# Patient Record
Sex: Female | Born: 1980 | Race: Black or African American | Hispanic: No | Smoking: Current every day smoker
Health system: Southern US, Community
[De-identification: ages and names within clinical notes are randomized; demographics above are authoritative.]

## PROBLEM LIST (undated history)

## (undated) DIAGNOSIS — Z87442 Personal history of urinary calculi: Secondary | ICD-10-CM

## (undated) DIAGNOSIS — F259 Schizoaffective disorder, unspecified: Secondary | ICD-10-CM

## (undated) DIAGNOSIS — F32A Depression, unspecified: Secondary | ICD-10-CM

## (undated) DIAGNOSIS — F329 Major depressive disorder, single episode, unspecified: Secondary | ICD-10-CM

## (undated) DIAGNOSIS — F250 Schizoaffective disorder, bipolar type: Secondary | ICD-10-CM

## (undated) DIAGNOSIS — F319 Bipolar disorder, unspecified: Secondary | ICD-10-CM

## (undated) DIAGNOSIS — G43909 Migraine, unspecified, not intractable, without status migrainosus: Secondary | ICD-10-CM

## (undated) DIAGNOSIS — J45909 Unspecified asthma, uncomplicated: Secondary | ICD-10-CM

## (undated) DIAGNOSIS — K219 Gastro-esophageal reflux disease without esophagitis: Secondary | ICD-10-CM

## (undated) HISTORY — DX: Bipolar disorder, unspecified: F31.9

## (undated) HISTORY — PX: OTHER SURGICAL HISTORY: SHX169

## (undated) HISTORY — DX: Schizoaffective disorder, bipolar type: F25.0

## (undated) HISTORY — DX: Depression, unspecified: F32.A

## (undated) HISTORY — DX: Schizoaffective disorder, unspecified: F25.9

## (undated) HISTORY — DX: Major depressive disorder, single episode, unspecified: F32.9

## (undated) HISTORY — DX: Migraine, unspecified, not intractable, without status migrainosus: G43.909

---

## 2004-07-06 ENCOUNTER — Emergency Department: Payer: Self-pay | Admitting: General Practice

## 2004-12-23 ENCOUNTER — Emergency Department: Payer: Self-pay | Admitting: Emergency Medicine

## 2005-06-25 ENCOUNTER — Emergency Department: Payer: Self-pay | Admitting: Emergency Medicine

## 2005-07-07 ENCOUNTER — Emergency Department: Payer: Self-pay | Admitting: Emergency Medicine

## 2005-08-11 ENCOUNTER — Emergency Department: Payer: Self-pay | Admitting: Emergency Medicine

## 2006-01-13 ENCOUNTER — Ambulatory Visit: Payer: Self-pay | Admitting: Emergency Medicine

## 2006-01-13 ENCOUNTER — Emergency Department: Payer: Self-pay | Admitting: Emergency Medicine

## 2006-02-05 ENCOUNTER — Emergency Department: Payer: Self-pay | Admitting: Emergency Medicine

## 2006-03-17 ENCOUNTER — Emergency Department: Payer: Self-pay | Admitting: General Practice

## 2006-07-21 ENCOUNTER — Emergency Department: Payer: Self-pay | Admitting: Emergency Medicine

## 2006-08-17 ENCOUNTER — Emergency Department: Payer: Self-pay | Admitting: Emergency Medicine

## 2007-03-02 ENCOUNTER — Emergency Department: Payer: Self-pay | Admitting: Emergency Medicine

## 2007-10-16 ENCOUNTER — Emergency Department: Payer: Self-pay | Admitting: Emergency Medicine

## 2007-10-18 ENCOUNTER — Emergency Department: Payer: Self-pay | Admitting: Internal Medicine

## 2007-10-20 ENCOUNTER — Emergency Department: Payer: Self-pay | Admitting: Emergency Medicine

## 2008-01-19 ENCOUNTER — Emergency Department: Payer: Self-pay | Admitting: Emergency Medicine

## 2008-08-20 ENCOUNTER — Emergency Department: Payer: Self-pay | Admitting: Emergency Medicine

## 2008-12-08 ENCOUNTER — Emergency Department: Payer: Self-pay | Admitting: Unknown Physician Specialty

## 2008-12-14 ENCOUNTER — Emergency Department: Payer: Self-pay | Admitting: Unknown Physician Specialty

## 2009-01-30 ENCOUNTER — Emergency Department: Payer: Self-pay | Admitting: Emergency Medicine

## 2010-04-15 ENCOUNTER — Emergency Department: Payer: Self-pay | Admitting: Unknown Physician Specialty

## 2010-04-28 ENCOUNTER — Emergency Department: Payer: Self-pay | Admitting: Emergency Medicine

## 2010-12-06 ENCOUNTER — Emergency Department: Payer: Self-pay | Admitting: Emergency Medicine

## 2011-02-24 ENCOUNTER — Emergency Department: Payer: Self-pay | Admitting: Emergency Medicine

## 2011-06-01 ENCOUNTER — Emergency Department: Payer: Self-pay | Admitting: Emergency Medicine

## 2011-06-30 ENCOUNTER — Emergency Department: Payer: Self-pay | Admitting: Emergency Medicine

## 2011-09-29 ENCOUNTER — Emergency Department: Payer: Self-pay | Admitting: Emergency Medicine

## 2011-10-22 ENCOUNTER — Emergency Department: Payer: Self-pay | Admitting: Emergency Medicine

## 2011-10-22 LAB — COMPREHENSIVE METABOLIC PANEL
Anion Gap: 14 (ref 7–16)
BUN: 8 mg/dL (ref 7–18)
Bilirubin,Total: 0.2 mg/dL (ref 0.2–1.0)
Chloride: 107 mmol/L (ref 98–107)
Creatinine: 0.71 mg/dL (ref 0.60–1.30)
EGFR (African American): >60
EGFR (Non-African Amer.): >60
Osmolality: 283 (ref 275–301)
Potassium: 3.5 mmol/L (ref 3.5–5.1)
SGOT(AST): 14 U/L — ABNORMAL LOW (ref 15–37)
Sodium: 142 mmol/L (ref 136–145)
Total Protein: 7.7 g/dL (ref 6.4–8.2)

## 2011-10-22 LAB — CBC
HCT: 34.5 % — ABNORMAL LOW (ref 35.0–47.0)
MCH: 24.8 pg — ABNORMAL LOW (ref 26.0–34.0)
Platelet: 358 10*3/uL (ref 150–440)
RBC: 4.48 10*6/uL (ref 3.80–5.20)
WBC: 6.5 10*3/uL (ref 3.6–11.0)

## 2011-10-22 LAB — TROPONIN I: Troponin-I: <0.02 ng/mL

## 2011-11-04 ENCOUNTER — Emergency Department: Payer: Self-pay | Admitting: Emergency Medicine

## 2012-01-19 ENCOUNTER — Emergency Department: Payer: Self-pay | Admitting: Emergency Medicine

## 2012-01-19 LAB — COMPREHENSIVE METABOLIC PANEL
Albumin: 3.7 g/dL (ref 3.4–5.0)
Calcium, Total: 8.7 mg/dL (ref 8.5–10.1)
Chloride: 107 mmol/L (ref 98–107)
EGFR (African American): >60
EGFR (Non-African Amer.): >60
Glucose: 88 mg/dL (ref 65–99)
Osmolality: 280 (ref 275–301)
SGOT(AST): 17 U/L (ref 15–37)
Sodium: 142 mmol/L (ref 136–145)
Total Protein: 7.6 g/dL (ref 6.4–8.2)

## 2012-01-19 LAB — URINALYSIS, COMPLETE
Glucose,UR: NEGATIVE mg/dL (ref 0–75)
Leukocyte Esterase: NEGATIVE
Nitrite: POSITIVE
Specific Gravity: 1.019 (ref 1.003–1.030)
Squamous Epithelial: 9
WBC UR: NONE SEEN /HPF (ref 0–5)

## 2012-01-19 LAB — DRUG SCREEN, URINE
Amphetamines, Ur Screen: NEGATIVE (ref ?–1000)
Cannabinoid 50 Ng, Ur ~~LOC~~: POSITIVE (ref ?–50)
Cocaine Metabolite,Ur ~~LOC~~: NEGATIVE (ref ?–300)
MDMA (Ecstasy)Ur Screen: NEGATIVE (ref ?–500)
Opiate, Ur Screen: NEGATIVE (ref ?–300)

## 2012-01-19 LAB — CBC
HCT: 35.1 % (ref 35.0–47.0)
HGB: 11.1 g/dL — ABNORMAL LOW (ref 12.0–16.0)
MCH: 24.3 pg — ABNORMAL LOW (ref 26.0–34.0)
Platelet: 320 10*3/uL (ref 150–440)
RBC: 4.55 10*6/uL (ref 3.80–5.20)
RDW: 18.4 % — ABNORMAL HIGH (ref 11.5–14.5)
WBC: 5.8 10*3/uL (ref 3.6–11.0)

## 2012-01-19 LAB — SALICYLATE LEVEL: Salicylates, Serum: 3.6 mg/dL — ABNORMAL HIGH

## 2012-01-19 LAB — ETHANOL: Ethanol %: <0.003 % (ref 0.000–0.080)

## 2012-05-20 ENCOUNTER — Emergency Department: Payer: Self-pay | Admitting: Emergency Medicine

## 2012-05-20 LAB — URINALYSIS, COMPLETE
Bilirubin,UR: NEGATIVE
Glucose,UR: NEGATIVE mg/dL (ref 0–75)
Ketone: NEGATIVE
Ph: 7 (ref 4.5–8.0)
Protein: 30
RBC,UR: 2613 /HPF (ref 0–5)
Squamous Epithelial: 1

## 2012-05-20 LAB — CBC
HCT: 35.6 % (ref 35.0–47.0)
HGB: 11.8 g/dL — ABNORMAL LOW (ref 12.0–16.0)
MCH: 26.9 pg (ref 26.0–34.0)
MCV: 81 fL (ref 80–100)
RBC: 4.39 10*6/uL (ref 3.80–5.20)
RDW: 18.8 % — ABNORMAL HIGH (ref 11.5–14.5)

## 2012-05-20 LAB — COMPREHENSIVE METABOLIC PANEL
Alkaline Phosphatase: 71 U/L (ref 50–136)
Anion Gap: 7 (ref 7–16)
BUN: 9 mg/dL (ref 7–18)
Bilirubin,Total: 0.2 mg/dL (ref 0.2–1.0)
Calcium, Total: 9 mg/dL (ref 8.5–10.1)
Chloride: 106 mmol/L (ref 98–107)
Co2: 27 mmol/L (ref 21–32)
Creatinine: 0.72 mg/dL (ref 0.60–1.30)
EGFR (African American): >60
EGFR (Non-African Amer.): >60
Glucose: 96 mg/dL (ref 65–99)
SGOT(AST): 17 U/L (ref 15–37)
SGPT (ALT): 18 U/L (ref 12–78)
Sodium: 140 mmol/L (ref 136–145)
Total Protein: 7.8 g/dL (ref 6.4–8.2)

## 2012-05-20 LAB — LIPASE, BLOOD: Lipase: 105 U/L (ref 73–393)

## 2012-05-23 LAB — URINE CULTURE

## 2012-06-08 ENCOUNTER — Emergency Department: Payer: Self-pay | Admitting: Emergency Medicine

## 2012-11-05 LAB — BASIC METABOLIC PANEL
Anion Gap: 9 (ref 7–16)
BUN: 9 mg/dL (ref 7–18)
Chloride: 107 mmol/L (ref 98–107)
Co2: 22 mmol/L (ref 21–32)
EGFR (African American): >60
EGFR (Non-African Amer.): >60
Potassium: 3.6 mmol/L (ref 3.5–5.1)

## 2012-11-05 LAB — CBC
HCT: 36.4 % (ref 35.0–47.0)
HGB: 11.6 g/dL — ABNORMAL LOW (ref 12.0–16.0)
MCH: 25.5 pg — ABNORMAL LOW (ref 26.0–34.0)
MCHC: 31.8 g/dL — ABNORMAL LOW (ref 32.0–36.0)

## 2012-11-06 ENCOUNTER — Inpatient Hospital Stay: Payer: Self-pay | Admitting: Internal Medicine

## 2012-11-09 LAB — CREATININE, SERUM
Creatinine: 0.63 mg/dL (ref 0.60–1.30)
EGFR (African American): >60
EGFR (Non-African Amer.): >60

## 2012-11-09 LAB — PLATELET COUNT: Platelet: 386 10*3/uL (ref 150–440)

## 2013-03-03 ENCOUNTER — Emergency Department: Payer: Self-pay | Admitting: Emergency Medicine

## 2013-03-05 ENCOUNTER — Emergency Department: Payer: Self-pay | Admitting: Emergency Medicine

## 2013-05-31 LAB — CBC
HCT: 35.9 % (ref 35.0–47.0)
HGB: 11.8 g/dL — ABNORMAL LOW (ref 12.0–16.0)
MCH: 25.4 pg — ABNORMAL LOW (ref 26.0–34.0)
MCHC: 32.8 g/dL (ref 32.0–36.0)
Platelet: 341 10*3/uL (ref 150–440)
RBC: 4.64 10*6/uL (ref 3.80–5.20)
RDW: 18 % — ABNORMAL HIGH (ref 11.5–14.5)
WBC: 13.1 10*3/uL — ABNORMAL HIGH (ref 3.6–11.0)

## 2013-05-31 LAB — COMPREHENSIVE METABOLIC PANEL
Anion Gap: 8 (ref 7–16)
BUN: 7 mg/dL (ref 7–18)
Bilirubin,Total: 0.3 mg/dL (ref 0.2–1.0)
Co2: 22 mmol/L (ref 21–32)
EGFR (African American): >60
EGFR (Non-African Amer.): >60
Glucose: 113 mg/dL — ABNORMAL HIGH (ref 65–99)
Osmolality: 273 (ref 275–301)
Sodium: 137 mmol/L (ref 136–145)
Total Protein: 7.8 g/dL (ref 6.4–8.2)

## 2013-05-31 LAB — URINALYSIS, COMPLETE
Bilirubin,UR: NEGATIVE
Ph: 5 (ref 4.5–8.0)
Protein: 30
RBC,UR: 823 /HPF (ref 0–5)
Squamous Epithelial: 3

## 2013-06-01 LAB — CBC WITH DIFFERENTIAL/PLATELET
Basophil %: 0.1 %
Eosinophil #: 0 10*3/uL (ref 0.0–0.7)
Eosinophil %: 0 %
HGB: 11.8 g/dL — ABNORMAL LOW (ref 12.0–16.0)
Lymphocyte #: 0.9 10*3/uL — ABNORMAL LOW (ref 1.0–3.6)
MCHC: 31.8 g/dL — ABNORMAL LOW (ref 32.0–36.0)
Monocyte #: 0.1 x10 3/mm — ABNORMAL LOW (ref 0.2–0.9)
Neutrophil #: 14.6 10*3/uL — ABNORMAL HIGH (ref 1.4–6.5)
Platelet: 356 10*3/uL (ref 150–440)
RBC: 4.77 10*6/uL (ref 3.80–5.20)
RDW: 18.3 % — ABNORMAL HIGH (ref 11.5–14.5)

## 2013-06-02 ENCOUNTER — Inpatient Hospital Stay: Payer: Self-pay | Admitting: Internal Medicine

## 2013-06-02 LAB — CBC WITH DIFFERENTIAL/PLATELET
Basophil #: 0.1 10*3/uL (ref 0.0–0.1)
HCT: 35.9 % (ref 35.0–47.0)
Lymphocyte %: 5.9 %
MCV: 78 fL — ABNORMAL LOW (ref 80–100)
Monocyte %: 2.7 %
Neutrophil %: 91 %
Platelet: 371 10*3/uL (ref 150–440)
WBC: 20.9 10*3/uL — ABNORMAL HIGH (ref 3.6–11.0)

## 2013-06-02 LAB — DRUG SCREEN, URINE
Barbiturates, Ur Screen: NEGATIVE (ref ?–200)
Benzodiazepine, Ur Scrn: NEGATIVE (ref ?–200)
Cannabinoid 50 Ng, Ur ~~LOC~~: POSITIVE (ref ?–50)
Cocaine Metabolite,Ur ~~LOC~~: NEGATIVE (ref ?–300)
MDMA (Ecstasy)Ur Screen: POSITIVE (ref ?–500)
Methadone, Ur Screen: NEGATIVE (ref ?–300)
Tricyclic, Ur Screen: NEGATIVE (ref ?–1000)

## 2013-06-03 LAB — CBC WITH DIFFERENTIAL/PLATELET
Basophil #: 0.1 10*3/uL (ref 0.0–0.1)
HGB: 11.5 g/dL — ABNORMAL LOW (ref 12.0–16.0)
Lymphocyte #: 1.3 10*3/uL (ref 1.0–3.6)
MCH: 25 pg — ABNORMAL LOW (ref 26.0–34.0)
MCHC: 32.1 g/dL (ref 32.0–36.0)
MCV: 78 fL — ABNORMAL LOW (ref 80–100)
Monocyte #: 0.5 x10 3/mm (ref 0.2–0.9)
Monocyte %: 3.1 %
Neutrophil #: 15.6 10*3/uL — ABNORMAL HIGH (ref 1.4–6.5)
Platelet: 348 10*3/uL (ref 150–440)
RBC: 4.62 10*6/uL (ref 3.80–5.20)
RDW: 18.2 % — ABNORMAL HIGH (ref 11.5–14.5)

## 2013-06-04 LAB — CBC WITH DIFFERENTIAL/PLATELET
Basophil %: 0.3 %
Eosinophil #: 0 10*3/uL (ref 0.0–0.7)
HCT: 37.3 % (ref 35.0–47.0)
HGB: 12.1 g/dL (ref 12.0–16.0)
Lymphocyte #: 1.4 10*3/uL (ref 1.0–3.6)
Lymphocyte %: 9.6 %
MCH: 25.5 pg — ABNORMAL LOW (ref 26.0–34.0)
MCV: 78 fL — ABNORMAL LOW (ref 80–100)
Monocyte %: 3.4 %
Neutrophil #: 12.9 10*3/uL — ABNORMAL HIGH (ref 1.4–6.5)
Neutrophil %: 86.7 %
WBC: 14.9 10*3/uL — ABNORMAL HIGH (ref 3.6–11.0)

## 2013-06-04 LAB — CREATININE, SERUM: Creatinine: 0.72 mg/dL (ref 0.60–1.30)

## 2013-06-06 LAB — CBC WITH DIFFERENTIAL/PLATELET
Basophil #: 0 10*3/uL (ref 0.0–0.1)
Eosinophil #: 0 10*3/uL (ref 0.0–0.7)
Eosinophil %: 0.1 %
MCH: 25 pg — ABNORMAL LOW (ref 26.0–34.0)
MCV: 79 fL — ABNORMAL LOW (ref 80–100)
Monocyte #: 0.7 x10 3/mm (ref 0.2–0.9)
Monocyte %: 4.5 %
Neutrophil #: 13.3 10*3/uL — ABNORMAL HIGH (ref 1.4–6.5)
Neutrophil %: 85.7 %
Platelet: 330 10*3/uL (ref 150–440)
RBC: 4.92 10*6/uL (ref 3.80–5.20)
WBC: 15.6 10*3/uL — ABNORMAL HIGH (ref 3.6–11.0)

## 2013-06-06 LAB — BASIC METABOLIC PANEL
BUN: 13 mg/dL (ref 7–18)
EGFR (Non-African Amer.): >60
Glucose: 181 mg/dL — ABNORMAL HIGH (ref 65–99)
Osmolality: 277 (ref 275–301)
Potassium: 4.2 mmol/L (ref 3.5–5.1)
Sodium: 136 mmol/L (ref 136–145)

## 2013-06-06 LAB — EXPECTORATED SPUTUM ASSESSMENT W REFEX TO RESP CULTURE

## 2013-06-21 LAB — HEPATIC FUNCTION PANEL
ALT: 22 U/L (ref 7–35)
AST: 14 U/L (ref 13–35)
Alkaline Phosphatase: 65 U/L (ref 25–125)
Bilirubin, Total: 0.2 mg/dL

## 2013-06-21 LAB — BASIC METABOLIC PANEL
BUN: 9 mg/dL (ref 4–21)
CREATININE: 0.6 mg/dL (ref 0.5–1.1)
GLUCOSE: 117 mg/dL
POTASSIUM: 4.3 mmol/L (ref 3.4–5.3)
SODIUM: 142 mmol/L (ref 137–147)

## 2013-07-16 ENCOUNTER — Emergency Department: Payer: Self-pay | Admitting: Emergency Medicine

## 2013-09-08 DIAGNOSIS — J45909 Unspecified asthma, uncomplicated: Secondary | ICD-10-CM | POA: Insufficient documentation

## 2013-09-18 LAB — CBC AND DIFFERENTIAL
HCT: 37 % (ref 36–46)
Hemoglobin: 11.9 g/dL — AB (ref 12.0–16.0)
Neutrophils Absolute: 4 /uL
Platelets: 327 10*3/uL (ref 150–399)
WBC: 8.2 10^3/mL

## 2013-09-23 ENCOUNTER — Emergency Department: Payer: Self-pay | Admitting: Emergency Medicine

## 2013-09-28 ENCOUNTER — Emergency Department: Payer: Self-pay | Admitting: Emergency Medicine

## 2013-09-29 ENCOUNTER — Emergency Department: Payer: Self-pay | Admitting: Emergency Medicine

## 2013-11-23 ENCOUNTER — Observation Stay: Payer: Self-pay | Admitting: Internal Medicine

## 2013-11-23 LAB — BASIC METABOLIC PANEL
ANION GAP: 11 (ref 7–16)
BUN: 11 mg/dL (ref 7–18)
CO2: 22 mmol/L (ref 21–32)
Calcium, Total: 8.6 mg/dL (ref 8.5–10.1)
Chloride: 106 mmol/L (ref 98–107)
Creatinine: 0.83 mg/dL (ref 0.60–1.30)
EGFR (Non-African Amer.): >60
Glucose: 107 mg/dL — ABNORMAL HIGH (ref 65–99)
Osmolality: 277 (ref 275–301)
Potassium: 3.6 mmol/L (ref 3.5–5.1)
SODIUM: 139 mmol/L (ref 136–145)

## 2013-11-23 LAB — CBC WITH DIFFERENTIAL/PLATELET
BASOS PCT: 0.7 %
Basophil #: 0.1 10*3/uL (ref 0.0–0.1)
EOS PCT: 8 %
Eosinophil #: 0.6 10*3/uL (ref 0.0–0.7)
HCT: 35.2 % (ref 35.0–47.0)
HGB: 11.4 g/dL — AB (ref 12.0–16.0)
LYMPHS PCT: 32.2 %
Lymphocyte #: 2.6 10*3/uL (ref 1.0–3.6)
MCH: 26 pg (ref 26.0–34.0)
MCHC: 32.5 g/dL (ref 32.0–36.0)
MCV: 80 fL (ref 80–100)
Monocyte #: 0.5 x10 3/mm (ref 0.2–0.9)
Monocyte %: 5.9 %
Neutrophil #: 4.3 10*3/uL (ref 1.4–6.5)
Neutrophil %: 53.2 %
Platelet: 199 10*3/uL (ref 150–440)
RBC: 4.4 10*6/uL (ref 3.80–5.20)
RDW: 17.3 % — AB (ref 11.5–14.5)
WBC: 8.1 10*3/uL (ref 3.6–11.0)

## 2014-01-16 ENCOUNTER — Emergency Department (HOSPITAL_COMMUNITY): Payer: Self-pay

## 2014-01-16 ENCOUNTER — Emergency Department (HOSPITAL_COMMUNITY)
Admission: EM | Admit: 2014-01-16 | Discharge: 2014-01-16 | Disposition: A | Discharge status: Home / Self Care | Payer: Self-pay | Attending: Emergency Medicine | Admitting: Emergency Medicine

## 2014-01-16 ENCOUNTER — Encounter (HOSPITAL_COMMUNITY): Payer: Self-pay | Admitting: Emergency Medicine

## 2014-01-16 DIAGNOSIS — K219 Gastro-esophageal reflux disease without esophagitis: Secondary | ICD-10-CM | POA: Insufficient documentation

## 2014-01-16 DIAGNOSIS — Z79899 Other long term (current) drug therapy: Secondary | ICD-10-CM | POA: Insufficient documentation

## 2014-01-16 DIAGNOSIS — J45901 Unspecified asthma with (acute) exacerbation: Secondary | ICD-10-CM | POA: Insufficient documentation

## 2014-01-16 DIAGNOSIS — IMO0002 Reserved for concepts with insufficient information to code with codable children: Secondary | ICD-10-CM | POA: Insufficient documentation

## 2014-01-16 DIAGNOSIS — R0789 Other chest pain: Secondary | ICD-10-CM | POA: Insufficient documentation

## 2014-01-16 DIAGNOSIS — F17200 Nicotine dependence, unspecified, uncomplicated: Secondary | ICD-10-CM | POA: Insufficient documentation

## 2014-01-16 HISTORY — DX: Gastro-esophageal reflux disease without esophagitis: K21.9

## 2014-01-16 HISTORY — DX: Unspecified asthma, uncomplicated: J45.909

## 2014-01-16 MED ORDER — PREDNISONE 20 MG PO TABS
60.0000 mg | ORAL_TABLET | Freq: Once | ORAL | Status: AC
  Administered 2014-01-16: 60 mg via ORAL

## 2014-01-16 MED ORDER — PREDNISONE 20 MG PO TABS
60.0000 mg | ORAL_TABLET | Freq: Once | ORAL | Status: DC
  Filled 2014-01-16: qty 3

## 2014-01-16 MED ORDER — ALBUTEROL SULFATE (2.5 MG/3ML) 0.083% IN NEBU
5.0000 mg | INHALATION_SOLUTION | Freq: Once | RESPIRATORY_TRACT | Status: AC
  Administered 2014-01-16: 5 mg via RESPIRATORY_TRACT
  Filled 2014-01-16: qty 6

## 2014-01-16 MED ORDER — PREDNISONE 20 MG PO TABS: 40.0000 mg | ORAL_TABLET | Freq: Every day | ORAL | Status: DC

## 2014-01-16 MED ORDER — IPRATROPIUM-ALBUTEROL 0.5-2.5 (3) MG/3ML IN SOLN
5.0000 mL | Freq: Once | RESPIRATORY_TRACT | Status: AC
  Administered 2014-01-16: 3 mL via RESPIRATORY_TRACT
  Filled 2014-01-16: qty 3

## 2014-01-16 NOTE — Progress Notes (Signed)
ED CM consulted to see patient concerning f/u care. Pt presented to [facility] ED with asthma exacerbation. Pt is a resident of [place]. Met with patient at bedside, confirmed information. Pt reports that she attends the [facility] in [place] and received her medications from [facility] ([place]. Patient states that she does not have any issues with f/u or attaining medications.  Pt states, she will make an appointment for f/u care. Discussed the importance and benefits of f/u care. Pt verbalized understanding and agrees. Discussed plan with Dr. Aline Brochure who agrees with plan. No further CM needs identified.

## 2014-01-16 NOTE — ED Provider Notes (Signed)
CSN: [id number]     Arrival date & time 01/16/14  1319 History   First MD Initiated Contact with Patient 01/16/14 1415     Chief Complaint  Patient presents with  . Asthma     (Consider location/radiation/quality/duration/timing/severity/associated sxs/prior Treatment) HPI Comments: The patient is a 33 year old female with past medical history of asthma and GERD presenting today with a chief complaint of wheezing since this morning.  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 The patient reports using her Flovent without resolution of her symptoms, she reports she left her Combivent at home today. She denies fever or chills.  Patient is a 33 y.o. female presenting with asthma. The history is provided by the patient. No language interpreter was used.  Asthma Pertinent negatives include no abdominal pain, chest pain, chills, fever, nausea or vomiting.    Past Medical History  Diagnosis Date  . Asthma   . GERD (gastroesophageal reflux disease)    History reviewed. No pertinent past surgical history. No family history on file. History  Substance Use Topics  . Smoking status: Current Every Day Smoker  . Smokeless tobacco: Not on file  . Alcohol Use: Yes   OB History   Grav Para Term Preterm Abortions TAB SAB Ect Mult Living                 Review of Systems  Constitutional: Negative for fever and chills.  Respiratory: Positive for chest tightness, shortness of breath and wheezing.   Cardiovascular: Negative for chest pain and palpitations.  Gastrointestinal: Negative for nausea, vomiting and abdominal pain.  All other systems reviewed and are negative.     Allergies  Review of patient's allergies indicates no known allergies.  Home Medications   Prior to Admission medications   Medication Sig Start Date End Date Taking? Authorizing Provider  albuterol (PROVENTIL) (2.5 MG/3ML) 0.083% nebulizer solution Take 2.5 mg  by nebulization every 6 (six) hours as needed for wheezing or shortness of breath.   Yes Historical Provider, MD  albuterol-ipratropium (COMBIVENT) 18-103 MCG/ACT inhaler Inhale 1 puff into the lungs every 4 (four) hours as needed for wheezing or shortness of breath.   Yes Historical Provider, MD  esomeprazole (NEXIUM) 40 MG capsule Take 40 mg by mouth daily at 12 noon.   Yes Historical Provider, MD  fluticasone (FLOVENT HFA) 110 MCG/ACT inhaler Inhale into the lungs 2 (two) times daily.   Yes Historical Provider, MD   BP 139/71  Pulse 103  Temp(Src) 98.3 F (36.8 C) (Oral)  Resp 20  Ht 5\' 3"  (1.6 m)  Wt 276 lb (125.193 kg)  BMI 48.90 kg/m2  SpO2 97%  LMP 12/26/2013 Physical Exam  Nursing note and vitals reviewed. Constitutional: She is oriented to person, place, and time. She appears well-developed and well-nourished. No distress.  HENT:  Head: Normocephalic and atraumatic.  Right Ear: Tympanic membrane normal. No middle ear effusion.  Left Ear: Tympanic membrane and external ear normal.  No middle ear effusion.  Nose: Mucosal edema and rhinorrhea present. Right sinus exhibits no maxillary sinus tenderness and no frontal sinus tenderness. Left sinus exhibits no maxillary sinus tenderness and no frontal sinus tenderness.  Mouth/Throat: Uvula is midline and mucous membranes are normal. No oral lesions. No trismus in the jaw. No posterior oropharyngeal edema, posterior oropharyngeal erythema or tonsillar abscesses.  Eyes: EOM are normal. Pupils are equal, round, and reactive to light. Right eye exhibits no discharge. Left  eye exhibits no discharge.  Neck: Normal range of motion. Neck supple.  Cardiovascular: Normal rate and regular rhythm.   No murmur heard. Not tachycardic on exam. No lower terminating the  Pulmonary/Chest: Effort normal. Not tachypneic. No respiratory distress. She has wheezes. She has no rhonchi. She has no rales.  Mild expiratory wheezing bilaterally. Patient is able  to speak in complete sentences.  Abdominal: Soft. There is no tenderness. There is no rebound and no guarding.  Obese abdomen  Musculoskeletal: Normal range of motion.  Moves all extremities.  Neurological: She is alert and oriented to person, place, and time.  Skin: Skin is warm and dry. No rash noted. She is not diaphoretic.  Psychiatric: She has a normal mood and affect. Her behavior is normal. Thought content normal.    ED Course  Procedures (including critical care time) Labs Review Labs Reviewed - No data to display  Imaging Review Dg Chest 2 View (if Patient Has Fever And/or Copd)  01/16/2014   CLINICAL DATA:  Smoker, history of hypertension and asthma  EXAM: CHEST  2 VIEW  COMPARISON:  None.  FINDINGS: The heart size and mediastinal contours are within normal limits. Both lungs are clear. The visualized skeletal structures are unremarkable.  IMPRESSION: No active cardiopulmonary disease.   Electronically Signed   By: Elige KoHetal  Patel   On: 01/16/2014 14:09     EKG Interpretation None      MDM   Final diagnoses:  Asthma exacerbation   Patient presents to the emergency department with a asthma exacerbation, afebrile, Oxygen 99% room air. The patinet has received 1 DuoNeb, mild wheezing bilaterally, reports persistent tightness. Chest X-ray without acute processes. Will order another duo neb, prednisone, and reevaluate. 1520: Re-eval pt sleeping in room.  Reports symptom improvement.  Minimal expiratory wheezing on exam. Will discharge with Prednisone and follow up. Discussed imaging results, and treatment plan with the patient. Return precautions given. Reports understanding and no other concerns at this time.  Patient is stable for discharge at this time.  Meds given in ED:  Medications  albuterol (PROVENTIL) (2.5 MG/3ML) 0.083% nebulizer solution 5 mg (5 mg Nebulization Given 01/16/14 1329)  ipratropium-albuterol (DUONEB) 0.5-2.5 (3) MG/3ML nebulizer solution 5 mL (3 mLs  Nebulization Given 01/16/14 1434)  predniSONE (DELTASONE) tablet 60 mg (60 mg Oral Given 01/16/14 1434)    New Prescriptions   PREDNISONE (DELTASONE) 20 MG TABLET    Take 2 tablets (40 mg total) by mouth daily. Take 40 mg by mouth daily for 3 days, then 20mg  by mouth daily for 3 days, then 10mg  daily for 3 days       Clabe SealLauren M Arleth Mccullar, PA-C 01/18/14 1114

## 2014-01-16 NOTE — Discharge Instructions (Signed)
Call for a follow up appointment with a Family or Primary Care Provider.  Return to the emergency room if Symptoms worsen.   Take medication as prescribed.    Emergency Department Resource Guide 1) Find a Doctor and Pay Out of Pocket Although you won't have to find out who is covered by your insurance plan, it is a good idea to ask around and get recommendations. You will then need to call the office and see if the doctor you have chosen will accept you as a new patient and what types of options they offer for patients who are self-pay. Some doctors offer discounts or will set up payment plans for their patients who do not have insurance, but you will need to ask so you aren't surprised when you get to your appointment.  2) Contact Your Local Health Department Not all health departments have doctors that can see patients for sick visits, but many do, so it is worth a call to see if yours does. If you don't know where your local health department is, you can check in your phone book. The CDC also has a tool to help you locate your state's health department, and many state websites also have listings of all of their local health departments.  3) Find a Walk-in Clinic If your illness is not likely to be very severe or complicated, you may want to try a walk in clinic. These are popping up all over the country in pharmacies, drugstores, and shopping centers. They're usually staffed by nurse practitioners or physician assistants that have been trained to treat common illnesses and complaints. They're usually fairly quick and inexpensive. However, if you have serious medical issues or chronic medical problems, these are probably not your best option.  No Primary Care Doctor: - Call Health Connect at  [phone] - they can help you locate a primary care doctor that  accepts your insurance, provides certain services, etc. - Physician Referral Service- [phone]  Chronic Pain Problems: Organization          Address  Phone   Notes  [facility]  [phone] Patients need to be referred by their primary care doctor.   Medication Assistance: Organization         Address  Phone   Notes  [facility] Medication [facility] [address] [place], [place] [address] [phone] --Must be a resident of [facility] -- Must have NO insurance coverage whatsoever (no Medicaid/ Medicare, etc.) -- The pt. MUST have a primary care doctor that directs their care regularly and follows them in the community   [facility]ist  [phone]   [employer]  [phone]    Agencies that provide inexpensive medical care: Organization         Address  Phone   Notes  [facility]  [phone]   [facility]    [phone]   [facility] [address] [place], [place] [address] [phone]   [facility] [address] [place], [place] [phone]   Planned Parenthood    [phone]   [facility]    [phone]   Community Health and [facility]  [address], [place] Phone:  [phone], Fax:  [phone] Hours of Operation:  9 am - 6 pm, M-F.  Also accepts Medicaid/Medicare and self-pay.  [facility]  [address], [place] Phone: [phone], Fax: [phone]) [phone]. Hours of Operation:  8:30 am - 5:30 pm, M-F.  Also accepts Medicaid and self-pay.  [facility] [place] [address] [place] [address], [place] Phone: [phone]) [phone]   [facility] [address], [place], [place] [phone], Ext. [phone] Mondays & Thursdays: 7-9 AM.  First 15 patients are seen on a first come, first serve basis.    Medicaid-accepting [facility] Providers:  Organization         Address  Phone   Notes  [facility] [address], [place] [phone] Also accepts self-pay patients.    [facility] [address], [place]  [phone]   [facility] [address], [place] [phone]) [phone]   [facility] [address] [place] [address], [place] [phone]) [phone]   Renaye RakersVeita Bland [address], [place]   [phone] Only accepts [place] Access [place]icaid patients after they have their name applied to their card.   Self-Pay (no insurance) in [facility]:  Organization         Address  Phone   Notes  Sickle Cell Patients, [facility] [address], [place] [phone]   [facility] [address], [place] [phone]   [facility] [place]  [address], [place] [phone]   [facility]/Dr. Osei-Bonsu  [address] [place] [address], [place] or [phone], [place] [phone] Phone number for both [place] and [place] locations is the same.  [facility]gent [facility] and [facility] [address], [place] [phone]) [phone]   [facility] [address] [place] [address], [place] or [address] [phone]) [phone]   [facility] [address], [place] [phone]) [phone], phone; [phone], fax Sees patients 1st and 3rd Saturday of every month.  Must not qualify for public or private insurance (i.e. Medicaid, Medicare, [place] Health Choice, Veterans' Benefits)  Household income should be no more than 200% of the poverty level The clinic cannot treat you if you are pregnant or think you are pregnant  Sexually transmitted diseases are not treated at the clinic.    Dental Care: Organization         Address  Phone  Notes  [facility] Department of [facility] [address], [place] [phone] Accepts children up to age 34 who are enrolled in [place]icaid or [place] Health Choice; pregnant women with a Medicaid  card; and children who have applied for Medicaid or [place] Health Choice, but were declined, whose parents can pay a reduced fee at time of service.  [facility] Department of [facility] [place]  [address], [place] [phone] Accepts children up to age 34 who are enrolled in [place]icaid or [place] Health Choice; pregnant women with a Medicaid card; and children who have applied for Medicaid or [place] Health Choice, but were declined, whose parents can pay a reduced fee at time of service.  [facility]  [address], [place] [phone]) [phone] Patients are seen by appointment only. Walk-ins are not accepted. [facility] will see patients 34 years of age and older. Monday - Tuesday (8am-5pm) Most Wednesdays (8:30-5pm) $30 per visit, cash only  [facility] GroupDental Access  PROGRAM  [address], [facility] [phone] Patients are seen by appointment only. Walk-ins are not accepted. [facility] will see patients 34 years of age and older. One Wednesday Evening (Monthly: Volunteer Based).  $30 per visit, cash only  [employer] [facility]  [phone]) [phone] for adults; Children under age 694, call Graduate Pediatric Dentistry at [phone]) [phone]. Children aged 84-14, please call [phone]) [phone] to request a pediatric application.  Dental services are provided in all areas of dental care including fillings, crowns and bridges, complete and partial dentures, implants, gum treatment, root canals, and extractions. Preventive care is also provided. Treatment is provided to both adults and children. Patients are selected via a lottery and there is often a waiting list.   [facility] [address], [place]  [phone] [email or web address]   [facility] [address], [place], [place] [phone], Ext. [phone] Second and Fourth Thursday of each month, opens at 6:30 AM; Clinic ends at 9 AM.  Patients are seen on a first-come  first-served basis, and a limited number are seen during each clinic.   [facility]  [address], [place], [place] [phone]   Eligibility Requirements You must have lived in [place], [place], or [place] counties for at least the last three months.   You cannot be eligible for state or federal sponsored National Cityhealthcare insurance, including CIGNAVeterans Administration, [place]icaid, or [employer] EntertainmentMedicare.   You generally cannot be eligible for healthcare insurance through your employer.    How to apply: Eligibility screenings are held every Tuesday and Wednesday afternoon from 1:00 pm until 4:00 pm. You do not need an appointment for the interview!  [facility] [address], [place], [place] [phone]   [facility] Health Department  [phone]   [facility]  [phone]   [facility] Department  [phone]    Behavioral Health Resources in the Community: Intensive Outpatient Programs Organization         Address  Phone  Notes  [facility] [address], [place], [place] [phone]   [facility] [address], [place], [place] [phone]   ADS: Alcohol & Drug Sv[facility] [address], [place], [place]  [phone]   [facility] [address],  [place], [place] [phone] or [phone]   Substance Abuse Resources Organization         Address  Phone  Notes  [facility] Drug Services  [phone]   [facility]  [phone]   [facility]  [phone]   [facility]  [phone]   Residential & Outpatient Substance Abuse Program  [phone]   Psychological Services Organization         Address  Phone  Notes  [facility]  [phone]- [phone]   [facility]  [phone]- [phone]   [facility] [address], [place] [phone] or [phone]    Mobile Crisis Teams Organization          Address  Phone  Notes  [facility], Mobile Crisis Care Unit  [phone]   [facility]  [address] [place], [place] [phone]   Doristine LocksSharon DeEsch [address] [place] [phone]    Self-Help/Support Groups Organization         Address  Phone             Notes  Mental Health Assoc.  of [place] - variety of support groups  336- I7437963 Call for more information  Narcotics Anonymous (NA), Caring Services [address], [employer] [place]  2 meetings at this location   Statistician         Address  Phone  Notes  [facility] [address],    [place]  [phone]   [facility]  [address], [place] [address], [place], [place] [phone]   [facility] [address] [place], [place] [phone] Admissions: 8am-3pm M-F  [facility] [address],    [place], [place] [phone]   [facility] [address], [place], [place] [phone]   [facility] [address]  [place], [place] [phone]   [facility] - Intensive Outpatient [address], [place], [place] [phone]   [facility] ([facility]iction [facility] [address]  [place], [place] [phone] or [phone]   [facility] ([facility]S) [address], [place], [place] [phone] Accepts Medicaid  [facility] [place] [address],  [place] [phone] Substance Abuse/Addiction Treatment   [facility] Organization         Address  Phone  Notes  [facility]  [phone]   Angie Fava, PhD [address] [place] [address] [place], [place]   [phone] or [phone]   [facility]   [address] [place], [place] [phone]   [facility] [address] [place], [place] [phone] Insurance/Medicaid/sponsorship  through [facility] and Families [address]                                    [place], [place] [phone] Therapy/tele-psych/case  [facility] [address], [place] [phone]    Dr. Lolly Mustache  [phone]   [facility]  [employer] [facility] Dept. 1) [address]  2) [address] [place] 3)  [address], [place] [phone]  [phone]   [facility] Child Abuse Hotline (217)459-0673 or [phone] (After Hours)

## 2014-01-16 NOTE — ED Notes (Signed)
Pt reporting asthma attack since this morning. Used albuterol inhaler, did not help. Reports SOB. 99% RA. Speaking clear, concise sentences.

## 2014-01-16 NOTE — ED Notes (Signed)
Patient is alert and orientedx4.  Patient was explained discharge instructions and they understood them with no questions.   

## 2014-01-16 NOTE — ED Notes (Signed)
After treatment, pt wheezing improved. 99% RA.

## 2014-01-17 ENCOUNTER — Emergency Department: Payer: Self-pay | Admitting: Internal Medicine

## 2014-01-20 NOTE — ED Provider Notes (Signed)
Medical screening examination/treatment/procedure(s) were conducted as a shared visit with non-physician practitioner(s) and myself.  I personally evaluated the patient during the encounter.   EKG Interpretation None      I interviewed and examined the patient. Lungs w/ mild wheezing diffusely. Cardiac exam wnl. Abdomen soft.  Suspect mild asthma exac. Return precautions provided.   Junius ArgyleForrest S Kenneith Stief, MD 01/20/14 1022

## 2014-05-22 ENCOUNTER — Encounter (HOSPITAL_COMMUNITY): Payer: Self-pay | Admitting: Emergency Medicine

## 2014-05-22 ENCOUNTER — Emergency Department (HOSPITAL_COMMUNITY): Payer: Self-pay

## 2014-05-22 ENCOUNTER — Emergency Department (HOSPITAL_COMMUNITY)
Admission: EM | Admit: 2014-05-22 | Discharge: 2014-05-22 | Disposition: A | Discharge status: Home / Self Care | Payer: Self-pay | Attending: Emergency Medicine | Admitting: Emergency Medicine

## 2014-05-22 DIAGNOSIS — F17200 Nicotine dependence, unspecified, uncomplicated: Secondary | ICD-10-CM | POA: Insufficient documentation

## 2014-05-22 DIAGNOSIS — Y9229 Other specified public building as the place of occurrence of the external cause: Secondary | ICD-10-CM | POA: Insufficient documentation

## 2014-05-22 DIAGNOSIS — K219 Gastro-esophageal reflux disease without esophagitis: Secondary | ICD-10-CM | POA: Insufficient documentation

## 2014-05-22 DIAGNOSIS — J45909 Unspecified asthma, uncomplicated: Secondary | ICD-10-CM | POA: Insufficient documentation

## 2014-05-22 DIAGNOSIS — W2209XA Striking against other stationary object, initial encounter: Secondary | ICD-10-CM | POA: Insufficient documentation

## 2014-05-22 DIAGNOSIS — Y9301 Activity, walking, marching and hiking: Secondary | ICD-10-CM | POA: Insufficient documentation

## 2014-05-22 DIAGNOSIS — S6990XA Unspecified injury of unspecified wrist, hand and finger(s), initial encounter: Secondary | ICD-10-CM | POA: Insufficient documentation

## 2014-05-22 DIAGNOSIS — IMO0002 Reserved for concepts with insufficient information to code with codable children: Secondary | ICD-10-CM | POA: Insufficient documentation

## 2014-05-22 DIAGNOSIS — Z79899 Other long term (current) drug therapy: Secondary | ICD-10-CM | POA: Insufficient documentation

## 2014-05-22 DIAGNOSIS — S6991XA Unspecified injury of right wrist, hand and finger(s), initial encounter: Secondary | ICD-10-CM

## 2014-05-22 MED ORDER — IBUPROFEN 800 MG PO TABS: 800.0000 mg | ORAL_TABLET | Freq: Three times a day (TID) | ORAL | Status: DC

## 2014-05-22 MED ORDER — TRAMADOL HCL 50 MG PO TABS
50.0000 mg | ORAL_TABLET | Freq: Four times a day (QID) | ORAL | Status: DC | PRN
Start: 2014-05-22 — End: 2016-05-27

## 2014-05-22 NOTE — ED Provider Notes (Signed)
Medical screening examination/treatment/procedure(s) were performed by non-physician practitioner and as supervising physician I was immediately available for consultation/collaboration.   EKG Interpretation None        Jamaurion Slemmer, MD 05/22/14 2343 

## 2014-05-22 NOTE — ED Notes (Signed)
Pt c/o hitting her hand on a podium at school at about 1145 today.  C/o rt hand pain.

## 2014-05-22 NOTE — ED Provider Notes (Signed)
CSN: [id number]     Arrival date & time 05/22/14  1554 History  This chart was scribed for non-physician practitioner, Santiago Glad, PA-C working with Purvis Sheffield, MD by Luisa Dago, ED scribe. This patient was seen in room WTR6/WTR6 and the patient's care was started at 6:20 PM.    Chief Complaint  Patient presents with  . Hand Injury    The history is provided by the patient. No language interpreter was used.   HPI Comments: Amy Pennington is a 33 y.o. female who presents to the Emergency Department complaining of a right hand injury that occurred 11:40 AM this morning. Pt states that she was walking down the aisle when she hit her hand on a podium. She is currently complaining of associated swelling and pain that radiates up her right arm. She denies taking any OTC medication to relieve the pain. Pt also denies any paraesthesia, fever, chills, nausea, emesis, or weakness.    Past Medical History  Diagnosis Date  . Asthma   . GERD (gastroesophageal reflux disease)    No past surgical history on file. No family history on file. History  Substance Use Topics  . Smoking status: Current Every Day Smoker  . Smokeless tobacco: Not on file  . Alcohol Use: Yes   OB History   Grav Para Term Preterm Abortions TAB SAB Ect Mult Living                 Review of Systems  Constitutional: Negative for fever and chills.  Musculoskeletal: Positive for arthralgias and joint swelling. Negative for myalgias.  Neurological: Negative for weakness and numbness.   Allergies  Review of patient's allergies indicates no known allergies.  Home Medications   Prior to Admission medications   Medication Sig Start Date End Date Taking? Authorizing Provider  albuterol (PROVENTIL) (2.5 MG/3ML) 0.083% nebulizer solution Take 2.5 mg by nebulization every 6 (six) hours as needed for wheezing or shortness of breath.    Historical Provider, MD  albuterol-ipratropium (COMBIVENT) 18-103 MCG/ACT  inhaler Inhale 1 puff into the lungs every 4 (four) hours as needed for wheezing or shortness of breath.    Historical Provider, MD  esomeprazole (NEXIUM) 40 MG capsule Take 40 mg by mouth daily at 12 noon.    Historical Provider, MD  fluticasone (FLOVENT HFA) 110 MCG/ACT inhaler Inhale into the lungs 2 (two) times daily.    Historical Provider, MD  predniSONE (DELTASONE) 20 MG tablet Take 2 tablets (40 mg total) by mouth daily. Take 40 mg by mouth daily for 3 days, then  by mouth daily for 3 days, then  daily for 3 days 01/16/14   Mellody Drown, PA-C   Triage VitalslBP 126/81  Pulse 81  Temp(Src) 98 F (36.7 C) (Oral)  Resp 18  SpO2 100%  LMP 04/28/2014  Physical Exam  Nursing note and vitals reviewed. Constitutional: She is oriented to person, place, and time. She appears well-developed and well-nourished. No distress.  HENT:  Head: Normocephalic and atraumatic.  Eyes: Conjunctivae and EOM are normal.  Neck: Normal range of motion. Neck supple. No thyromegaly present.  Cardiovascular: Normal rate.   Pulses:      Radial pulses are 2+ on the right side.  Pulmonary/Chest: Effort normal. No respiratory distress.  Musculoskeletal: Normal range of motion.  Diffuse tenderness over the dorsal aspect of the right hand.No edema or ecchymosis.  No bony tenderness of the right wrist.  Full ROM of the right wrist.  Lymphadenopathy:  She has no cervical adenopathy.  Neurological: She is alert and oriented to person, place, and time.  Distal sensation of the fingers of the right hand intact.  Skin: Skin is warm and dry.  Psychiatric: She has a normal mood and affect. Her behavior is normal.    ED Course  Procedures (including critical care time)  DIAGNOSTIC STUDIES: Oxygen Saturation is 100% on RA, normal by my interpretation.    COORDINATION OF CARE: 6:27 PM- Will prescribe pain medication. Will also order a wrist splint as per pt's request. Pt advised of plan for treatment and  pt agrees.  Imaging Review Dg Hand Complete Right  05/22/2014   CLINICAL DATA:  Struck back of RIGHT hand on corner of podium today, pain at RIGHT hand and up RIGHT forearm  EXAM: RIGHT HAND - COMPLETE 3+ VIEW  COMPARISON:  None  FINDINGS: Osseous mineralization normal.  Joint spaces preserved.  No fracture, dislocation, or bone destruction.  Dorsal soft tissue swelling RIGHT hand.  IMPRESSION: No acute osseous abnormalities.   Electronically Signed   By: Ulyses Southward M.D.   On: 05/22/2014 16:47    MDM   Final diagnoses:  None   Patient presenting with right hand pain after hitting it on a podium just prior to arrival  Xray negative.  Patient neurovascularly intact.  Patient stable for discharge.  RICE instructions given to the patient. Return precautions given.  I personally performed the services described in this documentation, which was scribed in my presence. The recorded information has been reviewed and is accurate.    Santiago Glad, PA-C 05/22/14 2243

## 2014-05-22 NOTE — ED Notes (Signed)
Ortho tech at bedside 

## 2015-01-17 NOTE — H&P (Signed)
PATIENT NAME:  Amy Pennington, Amy Pennington MR#:  [id number] DATE OF BIRTH:  Sep 14, 1981  PRIMARY CARE PHYSICIAN: None local.   EMERGENCY ROOM PHYSICIAN: Dr. Cyril LoosenKinner.   CHIEF COMPLAINT: Cough, shortness of breath, wheezing for 3 days.   HISTORY OF PRESENT ILLNESS: The patient is a 34 year old African American female with a history of asthma since childhood, GERD, migraine headaches, bipolar disorder, who presented to the ED with the above chief complaint. The patient is alert, awake, oriented, in no acute distress. The patient said that she has had a cough, sputum, shortness of breath, and wheezing for the past 3 days. The patient had choking and chest tightness. The patient denies any fever or chills. No headache or dizziness. No hemoptysis. No leg swelling. The patient used her nebulizer without relief, so she came to the ED for further evaluation. The patient was treated with nebulizer and oxygen in the ED without much  improvement.   PAST MEDICAL HISTORY: Asthma, GERD, migraine headaches, bipolar disorder.   SOCIAL HISTORY: The patient smokes 10 cigarettes a day. No alcohol drinking. No use of illicit drugs.   PAST SURGICAL HISTORY: None. FAMILY HISTORY: The patient is adopted but her biological parents have asthma.   ALLERGIES: No.   MEDICATIONS:  1.  Ibuprofen 800 mg p.o. t.i.d.  2.  Cyclobenzaprine 10 mg p.o. 3 times a day p.r.n.  3.  Nebulizer.   REVIEW OF SYSTEMS: CONSTITUTIONAL: Denies any fever or chills. No headache or dizziness. No weakness.  EYES: No double vision or blurred vision.  ENT: No postnasal drip, slurred speech or dysphagia.  CARDIOVASCULAR: No chest pain but has chest tightness due to asthma. No edema, clubbing. No leg swelling, orthopnea, or nocturnal dyspnea.  PULMONARY: Positive for cough, sputum, shortness of breath and no hemoptysis but has wheezing.  GASTROINTESTINAL: No abdominal pain, nausea, vomiting or diarrhea. No melena or bloody stool.  GENITOURINARY: No dysuria,  hematuria, or incontinence.  SKIN: No rash or jaundice.  NEUROLOGY: No syncope, loss of consciousness or seizure.   PHYSICAL EXAMINATION:  VITAL SIGNS: Temperature 98.5, blood pressure 156/59, pulse 96, oxygen saturation 98% on room air, respirations 20.  GENERAL: Alert, awake, oriented, in no acute distress.  HEENT: Pupils round, equal and reactive to light and accommodation. Moist oral mucosa. Clear oropharynx.  NECK: Supple. No JVD or carotid bruit. No lymphadenopathy. No thyromegaly.  CARDIOVASCULAR: S1, S2, regular rate and rhythm. No murmurs or gallops.  PULMONARY: Bilateral air entry. Bilateral moderate wheezing. No crackles, no rales, and no use of accessory muscles to breathe.  ABDOMEN: Soft, obese, no distention or tenderness. No organomegaly. Bowel sounds present.  EXTREMITIES: No edema, clubbing or cyanosis. No calf tenderness. Strong bilateral pedal pulses.  SKIN: No rash or jaundice.  NEUROLOGY: AAO x3. No focal deficit. Power 5/5. Sensation intact.   LABORATORY, DIAGNOSTIC AND RADIOLOGICAL DATA: Glucose 113, BUN 7, creatinine 0.8 . Electrolytes are normal. WBC 13.1, hemoglobin 11.8. Chest x-ray did not show any acute cardiopulmonary disease. EKG showed normal sinus rhythm at 86 BPM.   IMPRESSIONS:  1.  Asthma exacerbation.  2.  Leukocytosis, possible acute bronchitis.  3.  Gastroesophageal reflux disease. 4.  Morbid obesity.  5.  Tobacco abuse.   PLAN OF TREATMENT:  1.  The patient will be placed for observation. We will continue O2 by nasal cannula,  DuoNebs and Solu-Medrol.  2.  Follow up CBC and magnesium level.  3.  Smoking cessation was counseled.  4.  Gastrointestinal and deep vein thrombosis prophylaxes. I  discussed the patient's condition and the plan of treatment with the patient and the patient's mother.     TIME SPENT: About 48 minutes.    ____________________________ Shaune Pollack, MD qc:np D: 05/31/2013 17:37:14 ET T: 05/31/2013 18:08:19  ET JOB#: [id number]  cc: Shaune Pollack, MD, <Dictator> Shaune Pollack MD ELECTRONICALLY SIGNED 05/31/2013 18:59

## 2015-01-17 NOTE — H&P (Signed)
PATIENT NAME:  Amy Pennington, Amy Pennington MR#:  [id number] DATE OF BIRTH:  1981-09-02  DATE OF ADMISSION:  11/06/2012  PRIMARY CARE PHYSICIAN: She has no local doctor.    REFERRING PHYSICIAN: The patient was seen by the physician assistant, Andrukonis.   CHIEF COMPLAINT: Shortness of breath and wheezing for 5 to 6 days.    HISTORY OF THE PRESENT ILLNESS: The patient is a 34 year old African American female with history of asthma since childhood and obesity. The patient states that she was in her usual state of health until about 5 to 6 days ago when she had symptoms of postnasal drip and sinus drainage. She felt gradually it was settling in her chest. She started to have a little dry cough, but then symptoms progressed to have wheezing which worsened over the last few days. The patient was treated here at the Emergency Department with several treatments with bronchodilator therapy and received 1 dose of prednisone 60 mg; however, she had partial improvement. Along with her symptoms, she also has acid reflux symptoms. Denies having any fever. No chest pain. No abdominal pain. No vomiting. No diarrhea. She complains of headache, stating that she has on and off migraine headaches. She does not recall what she takes for it over the counter.   REVIEW OF SYSTEMS:  CONSTITUTIONAL: Denies any fever. No chills. No fatigue.  EYES: No blurring of vision. No double vision.  EARS, NOSE, THROAT: No hearing impairment. No sore throat. No dysphagia.  CARDIOVASCULAR: No chest pain, but she has some shortness of breath with her wheezing. No edema. No syncope.  RESPIRATORY: Dry cough which is mild, wheezing and shortness of breath. No hemoptysis.  GASTROINTESTINAL: No abdominal pain. No vomiting. No diarrhea. No hematochezia. No melena. She describes gastroesophageal reflux disease.  GENITOURINARY: No dysuria. No frequency of urination.  MUSCULOSKELETAL: No joint pain or swelling. No muscular pain or swelling.  INTEGUMENTARY:  No skin rash. No ulcers.  NEUROLOGIC: No focal weakness. No seizure activity, but she has headache.  PSYCHIATRIC: No anxiety now, but she has bipolar disorder.  ENDOCRINE: No polyuria or polydipsia. No heat or cold intolerance.  HEMATOLOGY: No easy bruisability. No lymph node enlargement.   PAST MEDICAL HISTORY: Asthma since childhood, gastroesophageal reflux disease, migraine headaches, bipolar disorder.   PAST SURGICAL HISTORY: None.   FAMILY HISTORY: The patient is adopted; however, she reports her biological mother suffers from asthma, diabetes mellitus and depression.   SOCIAL HABITS: Ex-chronic smoker, used to smoke 1/2 pack to a pack a day since the age of 79. She states she quit on January 4th of this year. She drinks alcohol only occasionally. No drug abuse.   SOCIAL HISTORY: She is studying at college, preparing for business administration and marketing. She is separated from her husband. She lives with a partner.   ADMISSION MEDICATION: Proventil inhaler.   ALLERGIES: No known drug allergies.   PHYSICAL EXAMINATION:  VITAL SIGNS: Blood pressure 114/66, respiratory rate 20, pulse 93, temperature 98.1, oxygen saturation 99%.  GENERAL APPEARANCE: Young female, morbidly obese, lying down on the bed in no acute distress.  HEAD AND NECK: No pallor. No icterus. No cyanosis. Ear examination revealed normal hearing, no discharge and no lesions. Nasal mucosa was normal without discharge and no ulcers. Oropharyngeal area was normal without oral thrush and no ulcers. Eye examination revealed normal eyelids and conjunctivae. Pupils about 5 to 6 mm, round, equal and reactive to light. Neck is supple. Trachea at midline. No thyromegaly. No cervical lymphadenopathy.  HEART:  Normal S1, S2. No S3, S4. No murmur. No gallop. No carotid bruits.  RESPIRATORY: Normal breathing pattern at the time of my examination. Scattered wheezing. Slight prolongation of the expiratory phase. No rales.  ABDOMEN:  Morbidly obese. Soft without tenderness. No hepatosplenomegaly. No masses. No hernias.  SKIN: No ulcers. No subcutaneous nodules. Her skin appears to be dry.  MUSCULOSKELETAL: No joint swelling. No clubbing.  NEUROLOGIC: Cranial nerves II through XII were intact. No focal motor deficits.  PSYCHIATRIC: The patient is alert, oriented x3. Mood and affect: She appears a little short tempered.   LABORATORY AND RADIOLOGIC DATA: Chest x-ray showed no acute cardiopulmonary abnormality. There is a little prominence of pulmonary markings. No consolidation. No effusion. Serum glucose 103, BUN 9, creatinine 0.6, sodium 138, potassium 3.6. CBC showed a white count of 7900, hemoglobin 11, hematocrit 36, platelet count 298.   ASSESSMENT:  1. Acute asthmatic attack.   2. Mild acute sinusitis.  3. Migraine headache.  4. Gastroesophageal reflux disease.  5. Bipolar disorder.  6. Morbid obesity.   7. Ex-chronic smoker.   PLAN: Will admit to the medical floor. Intensify treatment with DuoNebs q.4 hours. IV Solu-Medrol with moderate dose of 60 mg q.8 hours. I will give her antibiotic using Zithromax since her initial symptoms started with upper respiratory tract infection. For her headache, I will give her Tylenol p.r.n. If that does not work, then acetaminophen with hydrocodone p.r.n. For her acid reflux, will give her Maalox p.r.n. in addition to Zantac 150 mg twice a day.   Time spent in evaluating this patient took more than 55 minutes.   ____________________________ Carney CornersAmir M. Rudene Rearwish, MD amd:gb D: 11/06/2012 00:44:41 ET T: 11/06/2012 01:07:38 ET JOB#: [id number]  cc: Carney CornersAmir M. Rudene Rearwish, MD, <Dictator> Zollie ScaleAMIR M Brazen Domangue MD ELECTRONICALLY SIGNED 11/06/2012 22:54

## 2015-01-17 NOTE — Discharge Summary (Signed)
PATIENT NAME:  Amy Pennington, Amy Pennington MR#:  [id number] DATE OF BIRTH:  [birth year]  DATE OF ADMISSION:  11/06/2012 DATE OF DISCHARGE:  11/09/2012  ADMITTING PHYSICIAN: Carney CornersAmir M. Rudene Rearwish, MD   DISCHARGING PHYSICIAN: Enid Baasadhika Deondrick Searls, MD  PRIMARY CARE PHYSICIAN: None.   CONSULTATIONS IN THE HOSPITAL: None.   DISCHARGE DIAGNOSES:  1. Acute asthma exacerbation.  2. Acute sinusitis.  3. Obesity.   DISCHARGE HOME MEDICATIONS:  1. Prednisone taper.  2. Proventil inhaler 2 puffs 4 times a day as needed.  3. Albuterol nebulizer 3 mL inhaled every 6 hours as needed for wheezing.   DISCHARGE HOME OXYGEN: None.  DISCHARGE DIET: Regular diet.   DISCHARGE ACTIVITY: As tolerated.    FOLLOWUP INSTRUCTIONS: PCP followup in 1 to 2 weeks. The patient advised to follow up with [facility]. Nebulizer was also provided to the patient.   LABS AND IMAGING STUDIES:  1. WBC was 7.9, hemoglobin 11.6, hematocrit 36.4, platelet count is 298.  2. Sodium 138, potassium 3.6, chloride 107, bicarbonate 22, BUN 9, creatinine 0.66, glucose 103 and calcium of 8.4.  3. Chest x-ray revealing no acute cardiopulmonary disease.   BRIEF HOSPITAL COURSE: The patient is a 34 year old obese African-American female with past medical history significant for asthma, who presented to the hospital secondary to worsening shortness of breath, postnasal drainage and wheezing for 5 to 6 days prior to the symptoms started. The patient initially came to ER, has received several neb treatments and also steroids with no improvement in her symptoms, so was admitted.   1. Acute asthma exacerbation, likely triggered by sinusitis. She was admitted, started on IV steroids, DuoNebs and also azithromycin for her upper respiratory tract infection and sinus symptoms. She also has underlying history of migraine and was treated with pain medications. Overall, she improved better, and she still had mild wheeze at the time of discharge, but she was being  sent on a prednisone taper. She was also provided with a nebulizer and advised to buy the nebulizer solution to use as needed. Her course has been otherwise uneventful in the hospital.   DISCHARGE CONDITION: Stable.   DISCHARGE DISPOSITION: Home.   TIME SPENT ON DISCHARGE: 40 minutes.   ____________________________ Enid Baasadhika Anterio Scheel, MD rk:OSi D: 11/10/2012 15:59:39 ET T: 11/11/2012 11:28:12 ET JOB#: [id number]  cc: Enid Baasadhika Shelise Maron, MD, <Dictator> Enid BaasADHIKA Marifer Hurd MD ELECTRONICALLY SIGNED 11/22/2012 15:58

## 2015-01-17 NOTE — Discharge Summary (Signed)
PATIENT NAME:  Amy Pennington, [name] MR#:  [id number] DATE OF BIRTH:  1981-08-28  DATE OF ADMISSION:  05/31/2013 DATE OF DISCHARGE:  06/06/2013  DISCHARGE DIAGNOSES:  1. Asthma exacerbation, likely due to tobacco abuse and acute bronchitis, improved. 2. Leukocytosis, likely due to high-dose steroids. 3. Tobacco abuse, counseled.   SECONDARY DIAGNOSES:  1. Gastroesophageal reflux disease.  2. Migraine headache.  3. Bipolar disorder.   CONSULTATIONS: None.   PROCEDURES AND RADIOLOGY: CT scan of the chest with contrast on 9th of September  showed hypoventilation without evidence of focal acute abnormalities.   MAJOR LABORATORY PANEL: His sputum culture grew light growth of group F streptococcus.   HISTORY AND SHORT HOSPITAL COURSE: The patient is a 34 year old female with the above-mentioned medical problems, who was admitted for asthma exacerbation. Please see Dr. Nicky Pughhen's dictated history and physical for further details. The patient was started on IV steroids along with nebulizer breathing treatment. She was slowly improving, and by the 10th of September, she was close to her baseline breathing status.  PERTINENT PHYSICAL EXAMINATION ON THE DATE OF DISCHARGE:  VITAL SIGNS: Are as follows: Temperature 97.3, heart rate 72 per minute, respirations 19 per minute, blood pressure 123/79 mmHg. She was saturating 96% on room air.  CARDIOVASCULAR: S1, S2 normal. No murmurs, rubs or gallop.  LUNGS: Clear to auscultation bilaterally. No wheezing, rales, rhonchi or crepitation.  ABDOMEN: Soft, benign.  NEUROLOGICAL: Nonfocal examination.  All other physical examination remained at baseline.   DISCHARGE MEDICATIONS:  1. Albuterol 2 puffs inhaled 3 times a day as needed.  2. Combivent 1 puff inhaled 4 times a day.  3. Prednisone 60 mg p.o. daily, taper 10 mg daily until finished.  4. Guaifenesin 600 mg p.o. b.i.d.  5. Dextromethorphan/guaifenesin 10 mL p.o. every 4 to 6 hours as needed.   DISCHARGE  DIET: Regular.   DISCHARGE ACTIVITY: As tolerated.   DISCHARGE INSTRUCTIONS AND FOLLOWUP: The patient was instructed to follow up with her primary care physician at [facility] in 1 to 2 weeks.   TOTAL TIME DISCHARGING THIS PATIENT: 45 minutes.   ____________________________ Ellamae SiaVipul S. Sherryll BurgerShah, MD vss:OSi D: 06/08/2013 22:41:33 ET T: 06/09/2013 07:06:07 ET JOB#: [id number]  cc: Denine Brotz S. Sherryll BurgerShah, MD, <Dictator> [facility] Ellamae SiaVIPUL S [facility] MD ELECTRONICALLY SIGNED 06/11/2013 10:25

## 2015-01-18 NOTE — Discharge Summary (Signed)
PATIENT NAME:  Amy Pennington, HACKER MR#:  [id number] DATE OF BIRTH:  Jan 26, 1981  DATE OF ADMISSION:  11/23/2013 DATE OF DISCHARGE:  11/23/2013  ADMITTING DIAGNOSIS: Asthma exacerbation.   DISCHARGE DIAGNOSES: 1.  Asthma exacerbation. 2.  Acute bronchitis. 3.  Hyperglycemia with Hemoglobin A1c unfortunately not checked during this admission.  4.  Lower extremity edema.  5.  Morbid obesity.  6.  Suspected obstructive sleep apnea.  7.  Anemia.  8.  Tobacco abuse, ongoing. 9.  Gastroesophageal reflux disease.   DISCHARGE CONDITION: Stable.   DISCHARGE MEDICATIONS: The patient is to continue: 1.  Albuterol inhalers 2 puffs 3 times daily as needed. 2.  Prednisone taper 50 mg p.o. once on the 28th of February 2015 then taper by 10 mg every 2 days until stopped. 3.  Combivent Respimat 1 puff 4 times daily as needed.  4.  DuoNebs 2.5/0.5 mg in 3 mL inhalation solution 3 mL 4 times daily as needed.  5.  Levofloxacin 750 mg p.o. daily for 5 more days.  6.  Fluticasone 110 mcg inhalation 2 inhalations twice daily.  7.  Guaifenesin 600 mg p.o. twice daily.  8.  Tussionex 5 mL twice daily.   HOME OXYGEN: None.   DIET: 2 grams salt, regular consistency.   ACTIVITY LIMITATIONS: As tolerated. The patient was advised to lose weight if possible.    DISCHARGE FOLLOWUP: Appointment with the [facility] in 2 days after discharge.   CONSULTANTS: Care management, social work.   RADIOLOGIC STUDIES: Chest x-ray, PA and lateral, 26th of February 2015, revealed mild diffuse peribronchial thickening likely related to the patient's history of underlying reactive airway disease. No focal infiltrates identified.   HISTORY AND HOSPITAL COURSE: The patient is 34 year old African American female with history of asthma and ongoing tobacco abuse, who presented to the hospital with complaints of shortness of breath. Please refer to Dr. Jarrett Soho admission note on the 27th of February 2015. She came in shortness of  breath as well as wheezing.   On arrival to the hospital, the patient's temperature was 98, heart rate was 111, respiration rate was 28, blood pressure 135/88, and saturation was 97% on room air. Physical examination was remarkable for prolonged expiratory phase as well as prominent wheezing in the right upper lobe. No other abnormalities were noted.   The patient's lab data done on admission, 27th of February 2015, revealed elevation of glucose to 107, otherwise BMP was normal. The patient's liver enzymes were not checked. The patient's hemoglobin was checked and was found to be 11.4. White blood cell count was normal at 8.1. Platelet count was 199,000. Absolute neutrophil count was normal at 4.3. The patient's chest x-ray revealed thickening in peribronchial areas concerning for possible bronchitis or underlying reactive airway disease.   The patient was admitted to the hospital for further evaluation. She was initiated on antibiotic as well as inhalation therapy and steroids. With this therapy she improved and by the end of the day her condition somewhat was more comfortable. By the time of discharge, she was more comfortable and she had less dyspnea. She was advised however to continue antibiotic therapy as well as inhalation therapy and steroid taper.  Her vital signs on the day of discharge: Temperature was 97.3, pulse ranging from 68 to 105, respiratory rate was 20, blood pressure 125/81, and saturation was 95% on room air at rest as well as on exertion.   The patient was advised to stop smoking and follow up with  her primary care physician for further recommendations.   In regards to acute bronchitis, she was advised to continue antibiotic therapy, as mentioned above. The patient was noted to be hyperglycemic. On the day of admission, the patient's blood glucose level was elevated at 107. Hemoglobin A1c unfortunately was not checked. It is recommended to follow it as outpatient to make sure the  patient is not diabetic or prediabetic. While in the hospital, the patient was also noted to have lower extremity edema. She was also noted to have morbid obesity and lower extremity edema was concerning for possible obstructive sleep apnea. Moreover, she had some symptoms and signs of obstructive sleep apnea. It is recommended to follow the patient's sleep study as outpatient. For anemia, the patient was advised to follow up with her primary care physician. For tobacco abuse, the patient was counseled and recommended nicotine replacement therapy. She was agreeable and she told me that she had nicotine patches at home. The patient is being discharged in stable condition with the above-mentioned medications and follow-up.   TIME SPENT: 40 minutes. ____________________________ Katharina Caperima Brendi Mccarroll, MD rv:sb D: 11/26/2013 19:28:26 ET T: 11/27/2013 11:11:23 ET JOB#: [id number]  cc: Katharina Caperima Janette Harvie, MD, <Dictator> [facility] Escarlet Saathoff MD ELECTRONICALLY SIGNED 11/29/2013 18:56

## 2015-01-18 NOTE — H&P (Signed)
PATIENT NAME:  Amy Pennington, ALIX MR#:  [id number] DATE OF BIRTH:  18-May-1981  DATE OF ADMISSION:  11/23/2013  REFERRING PHYSICIAN:  Physician Assistant, Sam Bullard.  PATIENT'S PRIMARY CARE PHYSICIAN:  Nonlocal.   CHIEF COMPLAINT: Shortness of breath, wheezing.  HISTORY OF PRESENT ILLNESS:  A 34 year old African-American female with past medical history of bipolar disorder not otherwise specified, gastroesophageal reflux disease, hypertension, asthma, obesity, presenting with shortness of breath. She describes approximately one week duration of shortness of breath with associated wheezing, stating that she was having an asthma flare. Symptoms originally began with URI-like symptoms, nasal congestion, rhinorrhea; however, those symptoms have subsequently resolved. She was attempting to  self-medicate with her albuterol nebulizers, was unable to control her symptoms. She then states her flare worsened whenever she was at [employer] and someone ordered a flaming quesadilla of some sort or skillet. Whenever the food came out, there was lots of steam and she says this worsened her breathing acutely. Today, the day of arrival to the hospital, her symptoms worsened to the point, where she was unable to move from her chair secondary to shortness of breath. She denies any cough, fevers, chills, chest pain, palpitations or recent sick contacts.  Currently, she has received 4 nebulizer treatments in the Emergency Department with some initial improvement.   REVIEW OF SYSTEMS:  CONSTITUTIONAL: Denies fever, fatigue, weakness.  EYES: Denied blurred vision, double vision, eye pain.  EARS, NOSE, THROAT: Denies tinnitus, ear pain, hearing loss.  RESPIRATORY: Denies cough.  Positive for wheeze and shortness of breath as described above.  CARDIOVASCULAR: Denies chest pain, palpitations.  GASTROINTESTINAL: Denies nausea, vomiting, diarrhea, abdominal pain.  GENITOURINARY: Denies dysuria, hematuria.  ENDOCRINE: Denies  nocturia or thyroid problems.  HEMATOLOGIC AND LYMPHATIC: Denies easy bruising, bleeding.   SKIN: Denies rash or lesion.  MUSCULOSKELETAL: Denies pain in neck, back, shoulder, knees, hips or arthritic symptoms.  NEUROLOGIC: Denies paralysis, paresthesias.  PSYCHIATRIC: Denies anxiety or depressive symptoms.   Otherwise, full review of systems performed by me is negative.   PAST MEDICAL HISTORY: Bipolar disorder not otherwise specified, gastroesophageal reflux disease, asthma. As far as her asthma goes, she has required 2 hospitalizations in the past. She has never been intubated. She typically gets symptoms 1 to 2 times weekly. She does not take any controlling medications.   SOCIAL HISTORY: Positive for every day tobacco use, rare alcohol use. Denies drug use.   FAMILY HISTORY: Positive for asthma and diabetes.   ALLERGIES: No known drug allergies.   HOME MEDICATIONS: Include albuterol 90 mcg inhalation, 2 puffs 3 times daily as needed for shortness of breath and wheezing.   PHYSICAL EXAMINATION: VITAL SIGNS: Temperature 98, heart rate 111, respirations 28 on arrival; currently 24. Blood pressure 135/88, saturating 97% on room air. Weight 122.5 kg, BMI 47.8.  GENERAL: Well-nourished, well-developed African-American female, currently in minimal distress secondary to respiratory status.  HEAD: Normocephalic, atraumatic.  EYES: Pupils equal, round, and reactive to light. Extraocular muscles intact. No scleral icterus.  MOUTH: Moist mucous membranes. Dentition intact. No abscess noted.  EARS, NOSE, THROAT: Clear without exudates. No external lesions.  NECK: Supple. No thyromegaly. No nodules. No JVD.  PULMONARY: Prolonged expiratory phase, as well as prominent wheezing in right upper lobe. Currently, no use of accessory muscles. Good respiratory effort.  CHEST: Nontender to palpation, speaking in full sentences.  CARDIOVASCULAR: S1, S2, tachycardic. No murmurs, rubs or gallops. No edema.  Pedal pulses 2+ bilaterally.  GASTROINTESTINAL: Soft, nontender, nondistended, obese. No masses. Positive  bowel sounds. No hepatosplenomegaly.   MUSCULOSKELETAL: No swelling, clubbing, edema. Range of motion full in all extremities.  NEUROLOGIC: Cranial nerves II through XII intact. No gross focal neurological deficits. Sensation intact. Reflexes intact.  SKIN: No ulcerations, lesions, rash, cyanosis. Skin warm, dry. Turgor intact.  PSYCHIATRIC: Mood and affect within normal limits. The patient is awake, alert and oriented x 3. Insight and judgment intact.   LABORATORY DATA: Chest x-ray performed revealing no acute cardiopulmonary process.  CBC:  WBC 8.1, hemoglobin 11.4, platelets of 199.  BMP pending at this time.   ASSESSMENT AND PLAN: A 34 year old African-American female with history of asthma, presenting with shortness of breath and wheezing.  1.  Asthma exacerbation. She has already received 4 breathing treatments in the Emergency Department with initial improvement. We will continue breathing treatments q.4 hours while awake, with the addition of ipratropium as some benefit, if she is not necessarily improving with albuterol alone. Provide supplemental O2 to keep SAO2  greater than 92%. Add inhaled steroids, as this should be part of her step-up regimen for asthma. I will give her a dose of magnesium now, as she has required multiple breathing treatments without much improvement. Will also place her on IV steroids 60 mg daily.  2.  Tobacco abuse. Current everyday abuse. The patient has no desire to quit.  3.  Obesity.  4.  Deep venous thromboembolism prophylaxis with heparin subcutaneous.  5.  CODE STATUS: The patient full code.   TIME SPENT: 45 minutes.     ____________________________ Cletis Athensavid K. Jonessa Triplett, MD dkh:NTS D: 11/23/2013 02:30:00 ET T: 11/23/2013 06:31:02 ET JOB#: [id number]  cc: Cletis Athensavid K. Eldred Sooy, MD, <Dictator> Reniah Cottingham Synetta ShadowK Paulette Lynch MD ELECTRONICALLY SIGNED 11/23/2013 20:16

## 2015-07-05 ENCOUNTER — Encounter: Payer: Self-pay | Admitting: *Deleted

## 2015-07-05 ENCOUNTER — Emergency Department: Payer: 59

## 2015-07-05 ENCOUNTER — Emergency Department
Admission: EM | Admit: 2015-07-05 | Discharge: 2015-07-05 | Disposition: A | Discharge status: Home / Self Care | Payer: 59 | Attending: Emergency Medicine | Admitting: Emergency Medicine

## 2015-07-05 DIAGNOSIS — Z720 Tobacco use: Secondary | ICD-10-CM | POA: Insufficient documentation

## 2015-07-05 DIAGNOSIS — Z791 Long term (current) use of non-steroidal anti-inflammatories (NSAID): Secondary | ICD-10-CM | POA: Diagnosis not present

## 2015-07-05 DIAGNOSIS — M79672 Pain in left foot: Secondary | ICD-10-CM

## 2015-07-05 DIAGNOSIS — Z79899 Other long term (current) drug therapy: Secondary | ICD-10-CM | POA: Insufficient documentation

## 2015-07-05 DIAGNOSIS — Z7951 Long term (current) use of inhaled steroids: Secondary | ICD-10-CM | POA: Diagnosis not present

## 2015-07-05 NOTE — Discharge Instructions (Signed)
Cryotherapy °Cryotherapy means treatment with cold. Ice or gel packs can be used to reduce both pain and swelling. Ice is the most helpful within the first 24 to 48 hours after an injury or flare-up from overusing a muscle or joint. Sprains, strains, spasms, burning pain, shooting pain, and aches can all be eased with ice. Ice can also be used when recovering from surgery. Ice is effective, has very few side effects, and is safe for most people to use. °PRECAUTIONS  °Ice is not a safe treatment option for people with: °· Raynaud phenomenon. This is a condition affecting small blood vessels in the extremities. Exposure to cold may cause your problems to return. °· Cold hypersensitivity. There are many forms of cold hypersensitivity, including: °¨ Cold urticaria. Red, itchy hives appear on the skin when the tissues begin to warm after being iced. °¨ Cold erythema. This is a red, itchy rash caused by exposure to cold. °¨ Cold hemoglobinuria. Red blood cells break down when the tissues begin to warm after being iced. The hemoglobin that carry oxygen are passed into the urine because they cannot combine with blood proteins fast enough. °· Numbness or altered sensitivity in the area being iced. °If you have any of the following conditions, do not use ice until you have discussed cryotherapy with your caregiver: °· Heart conditions, such as arrhythmia, angina, or chronic heart disease. °· High blood pressure. °· Healing wounds or open skin in the area being iced. °· Current infections. °· Rheumatoid arthritis. °· Poor circulation. °· Diabetes. °Ice slows the blood flow in the region it is applied. This is beneficial when trying to stop inflamed tissues from spreading irritating chemicals to surrounding tissues. However, if you expose your skin to cold temperatures for too long or without the proper protection, you can damage your skin or nerves. Watch for signs of skin damage due to cold. °HOME CARE INSTRUCTIONS °Follow  these tips to use ice and cold packs safely. °· Place a dry or damp towel between the ice and skin. A damp towel will cool the skin more quickly, so you may need to shorten the time that the ice is used. °· For a more rapid response, add gentle compression to the ice. °· Ice for no more than 10 to 20 minutes at a time. The bonier the area you are icing, the less time it will take to get the benefits of ice. °· Check your skin after 5 minutes to make sure there are no signs of a poor response to cold or skin damage. °· Rest 20 minutes or more between uses. °· Once your skin is numb, you can end your treatment. You can test numbness by very lightly touching your skin. The touch should be so light that you do not see the skin dimple from the pressure of your fingertip. When using ice, most people will feel these normal sensations in this order: cold, burning, aching, and numbness. °· Do not use ice on someone who cannot communicate their responses to pain, such as small children or people with dementia. °HOW TO MAKE AN ICE PACK °Ice packs are the most common way to use ice therapy. Other methods include ice massage, ice baths, and cryosprays. Muscle creams that cause a cold, tingly feeling do not offer the same benefits that ice offers and should not be used as a substitute unless recommended by your caregiver. °To make an ice pack, do one of the following: °· Place crushed ice or a   bag of frozen vegetables in a sealable plastic bag. Squeeze out the excess air. Place this bag inside another plastic bag. Slide the bag into a pillowcase or place a damp towel between your skin and the bag. °· Mix 3 parts water with 1 part rubbing alcohol. Freeze the mixture in a sealable plastic bag. When you remove the mixture from the freezer, it will be slushy. Squeeze out the excess air. Place this bag inside another plastic bag. Slide the bag into a pillowcase or place a damp towel between your skin and the bag. °SEEK MEDICAL CARE  IF: °· You develop white spots on your skin. This may give the skin a blotchy (mottled) appearance. °· Your skin turns blue or pale. °· Your skin becomes waxy or hard. °· Your swelling gets worse. °MAKE SURE YOU:  °· Understand these instructions. °· Will watch your condition. °· Will get help right away if you are not doing well or get worse. °  °This information is not intended to replace advice given to you by your health care provider. Make sure you discuss any questions you have with your health care provider. °  °Document Released: 05/10/2011 Document Revised: 10/04/2014 Document Reviewed: 05/10/2011 °Elsevier Interactive Patient Education ©2016 [employer] ° °Musculoskeletal Pain °Musculoskeletal pain is muscle and boney aches and pains. These pains can occur in any part of the body. Your caregiver may treat you without knowing the cause of the pain. They may treat you if blood or urine tests, X-rays, and other tests were normal.  °CAUSES °There is often not a definite cause or reason for these pains. These pains may be caused by a type of germ (virus). The discomfort may also come from overuse. Overuse includes working out too hard when your body is not fit. Boney aches also come from weather changes. Bone is sensitive to atmospheric pressure changes. °HOME CARE INSTRUCTIONS  °· Ask when your test results will be ready. Make sure you get your test results. °· Only take over-the-counter or prescription medicines for pain, discomfort, or fever as directed by your caregiver. If you were given medications for your condition, do not drive, operate machinery or power tools, or sign legal documents for 24 hours. Do not drink alcohol. Do not take sleeping pills or other medications that may interfere with treatment. °· Continue all activities unless the activities cause more pain. When the pain lessens, slowly resume normal activities. Gradually increase the intensity and duration of the activities or  exercise. °· During periods of severe pain, bed rest may be helpful. Lay or sit in any position that is comfortable. °· Putting ice on the injured area. °¨ Put ice in a bag. °¨ Place a towel between your skin and the bag. °¨ Leave the ice on for 15 to 20 minutes, 3 to 4 times a day. °· Follow up with your caregiver for continued problems and no reason can be found for the pain. If the pain becomes worse or does not go away, it may be necessary to repeat tests or do additional testing. Your caregiver may need to look further for a possible cause. °SEEK IMMEDIATE MEDICAL CARE IF: °· You have pain that is getting worse and is not relieved by medications. °· You develop chest pain that is associated with shortness or breath, sweating, feeling sick to your stomach (nauseous), or throw up (vomit). °· Your pain becomes localized to the abdomen. °· You develop any new symptoms that seem different or that concern   you. °MAKE SURE YOU:  °· Understand these instructions. °· Will watch your condition. °· Will get help right away if you are not doing well or get worse. °  °This information is not intended to replace advice given to you by your health care provider. Make sure you discuss any questions you have with your health care provider. °  °Document Released: 09/13/2005 Document Revised: 12/06/2011 Document Reviewed: 05/18/2013 °Elsevier Interactive Patient Education ©2016 [employer] ° °

## 2015-07-05 NOTE — ED Notes (Signed)
Pt states left foot pain and swelling, states she does not remember what happened but injury is over a month old

## 2015-07-05 NOTE — ED Provider Notes (Signed)
CSN: [id number]     Arrival date & time 07/05/15  1732 History   First MD Initiated Contact with Patient 07/05/15 1900     Chief Complaint  Patient presents with  . Foot Pain     (Consider location/radiation/quality/duration/timing/severity/associated sxs/prior Treatment) HPI  34 year old female presents to the emergency department for evaluation of left foot pain. Patient has had foot pain for greater than 1 month. She is uncertain if there is been any trauma or injury. Pain is 7 out of 10 and located over the third through the fifth metatarsals dorsally. She states she'll have swelling with standing for long periods. She has been taking ibuprofen 800 mg daily with mild relief. Patient denies any warmth, fevers, recent trauma. She is ambulatory with pain.   Past Medical History  Diagnosis Date  . Asthma   . GERD (gastroesophageal reflux disease)    History reviewed. No pertinent past surgical history. History reviewed. No pertinent family history. Social History  Substance Use Topics  . Smoking status: Current Every Day Smoker  . Smokeless tobacco: None  . Alcohol Use: Yes   OB History    No data available     Review of Systems  Constitutional: Negative for fever, chills, activity change and fatigue.  HENT: Negative for congestion, sinus pressure and sore throat.   Eyes: Negative for visual disturbance.  Respiratory: Negative for cough, chest tightness and shortness of breath.   Cardiovascular: Negative for chest pain and leg swelling.  Gastrointestinal: Negative for nausea, vomiting, abdominal pain and diarrhea.  Genitourinary: Negative for dysuria.  Musculoskeletal: Positive for joint swelling and arthralgias. Negative for gait problem.  Skin: Negative for rash.  Neurological: Negative for weakness, numbness and headaches.  Hematological: Negative for adenopathy.  Psychiatric/Behavioral: Negative for behavioral problems, confusion and agitation.      Allergies   Review of patient's allergies indicates no known allergies.  Home Medications   Prior to Admission medications   Medication Sig Start Date End Date Taking? Authorizing Provider  albuterol (PROVENTIL) (2.5 MG/3ML) 0.083% nebulizer solution Take 2.5 mg by nebulization every 6 (six) hours as needed for wheezing or shortness of breath.    Historical Provider, MD  albuterol-ipratropium (COMBIVENT) 18-103 MCG/ACT inhaler Inhale 1 puff into the lungs every 4 (four) hours as needed for wheezing or shortness of breath.    Historical Provider, MD  esomeprazole (NEXIUM) 40 MG capsule Take 40 mg by mouth daily at 12 noon.    Historical Provider, MD  fluticasone (FLOVENT HFA) 110 MCG/ACT inhaler Inhale into the lungs 2 (two) times daily.    Historical Provider, MD  ibuprofen (ADVIL,MOTRIN) 800 MG tablet Take 1 tablet (800 mg total) by mouth 3 (three) times daily. 05/22/14   Heather Laisure, PA-C  predniSONE (DELTASONE) 20 MG tablet Take 2 tablets (40 mg total) by mouth daily. Take 40 mg by mouth daily for 3 days, then  by mouth daily for 3 days, then  daily for 3 days 01/16/14   Mellody Drown, PA-C  traMADol (ULTRAM) 50 MG tablet Take 1 tablet (50 mg total) by mouth every 6 (six) hours as needed. 05/22/14   Heather Laisure, PA-C   BP 144/84 mmHg  Pulse 100  Temp(Src) 98.4 F (36.9 C) (Oral)  Resp 18  Ht  (1.6 m)  Wt 276 lb (125.193 kg)  BMI 48.90 kg/m2  SpO2 100% Physical Exam  Constitutional: She is oriented to person, place, and time. She appears well-developed and well-nourished. No distress.  HENT:  Head: Normocephalic and atraumatic.  Mouth/Throat: Oropharynx is clear and moist.  Eyes: EOM are normal. Pupils are equal, round, and reactive to light. Right eye exhibits no discharge. Left eye exhibits no discharge.  Neck: Normal range of motion. Neck supple.  Cardiovascular: Normal rate and intact distal pulses.   Pulmonary/Chest: Effort normal. No respiratory distress.   Musculoskeletal: Normal range of motion.  Examination of the left foot and ankle shows patient has full range of motion with plantarflexion, dorsiflexion. She is tender to palpation along the shafts of the third fourth and fifth metatarsals dorsally. There is no significant swelling, edema. There is no warmth or erythema. Patient is able to ambulate. There is no evidence of infection or skin ulceration.  Neurological: She is alert and oriented to person, place, and time. She has normal reflexes.  Skin: Skin is warm and dry.  Psychiatric: She has a normal mood and affect. Her behavior is normal. Thought content normal.    ED Course  Procedures (including critical care time) Labs Review Labs Reviewed - No data to display  Imaging Review Dg Foot Complete Left  07/05/2015   CLINICAL DATA:  Patient with left foot pain for 1 month. No known injury. Unable to move the fourth and fifth digits.  EXAM: LEFT FOOT - COMPLETE 3+ VIEW  COMPARISON:  None.  FINDINGS: Normal anatomic alignment. Bipartite medial sesamoid bone. No evidence for acute fracture or dislocation. Plantar calcaneal spurring. Vascular calcifications.  IMPRESSION: No acute osseous abnormality.   Electronically Signed   By: Annia Belt M.D.   On: 07/05/2015 19:32   I have personally reviewed and evaluated these images and lab results as part of my medical decision-making.   EKG Interpretation None      MDM   Final diagnoses:  Left foot pain    34 year old female with left foot pain. It does present for greater than 1 month with no history of trauma. X-rays negative for any acute bony abnormality. No signs of infection or gout/pseudogout. Patient is given a walker to help with limiting weightbearing on the left lower extremity. She will apply Ace wrap to help with swelling after standing for long periods. Follow-up with podiatry. Continue with ibuprofen as needed for pain.    Evon Slack, PA-C 07/05/15  2051  Rockne Menghini, MD 07/05/15 2220

## 2015-12-21 ENCOUNTER — Encounter: Payer: Self-pay | Admitting: Medical Oncology

## 2015-12-21 ENCOUNTER — Emergency Department
Admission: EM | Admit: 2015-12-21 | Discharge: 2015-12-21 | Disposition: A | Discharge status: Home / Self Care | Payer: Self-pay | Attending: Emergency Medicine | Admitting: Emergency Medicine

## 2015-12-21 DIAGNOSIS — Z79899 Other long term (current) drug therapy: Secondary | ICD-10-CM | POA: Insufficient documentation

## 2015-12-21 DIAGNOSIS — L739 Follicular disorder, unspecified: Secondary | ICD-10-CM | POA: Insufficient documentation

## 2015-12-21 DIAGNOSIS — F17200 Nicotine dependence, unspecified, uncomplicated: Secondary | ICD-10-CM | POA: Insufficient documentation

## 2015-12-21 DIAGNOSIS — Z7951 Long term (current) use of inhaled steroids: Secondary | ICD-10-CM | POA: Insufficient documentation

## 2015-12-21 MED ORDER — CEPHALEXIN 500 MG PO CAPS: 500.0000 mg | ORAL_CAPSULE | Freq: Two times a day (BID) | ORAL | Status: AC

## 2015-12-21 MED ORDER — IBUPROFEN 600 MG PO TABS: 600.0000 mg | ORAL_TABLET | Freq: Four times a day (QID) | ORAL | Status: DC | PRN

## 2015-12-21 NOTE — ED Notes (Signed)
Pt reports she has been having "knots" to bilt breasts with areas that she can squeeze pus out of. Pt reports occasional pain to area.

## 2015-12-21 NOTE — Discharge Instructions (Signed)
Folliculitis °Folliculitis is redness, soreness, and swelling (inflammation) of the hair follicles. This condition can occur anywhere on the body. People with weakened immune systems, diabetes, or obesity have a greater risk of getting folliculitis. °CAUSES °· Bacterial infection. This is the most common cause. °· Fungal infection. °· Viral infection. °· Contact with certain chemicals, especially oils and tars. °Long-term folliculitis can result from bacteria that live in the nostrils. The bacteria may trigger multiple outbreaks of folliculitis over time. °SYMPTOMS °Folliculitis most commonly occurs on the scalp, thighs, legs, back, buttocks, and areas where hair is shaved frequently. An early sign of folliculitis is a small, white or yellow, pus-filled, itchy lesion (pustule). These lesions appear on a red, inflamed follicle. They are usually less than 0.2 inches (5 mm) wide. When there is an infection of the follicle that goes deeper, it becomes a boil or furuncle. A group of closely packed boils creates a larger lesion (carbuncle). Carbuncles tend to occur in hairy, sweaty areas of the body. °DIAGNOSIS  °Your caregiver can usually tell what is wrong by doing a physical exam. A sample may be taken from one of the lesions and tested in a lab. This can help determine what is causing your folliculitis. °TREATMENT  °Treatment may include: °· Applying warm compresses to the affected areas. °· Taking antibiotic medicines orally or applying them to the skin. °· Draining the lesions if they contain a large amount of pus or fluid. °· Laser hair removal for cases of long-lasting folliculitis. This helps to prevent regrowth of the hair. °HOME CARE INSTRUCTIONS °· Apply warm compresses to the affected areas as directed by your caregiver. °· If antibiotics are prescribed, take them as directed. Finish them even if you start to feel better. °· You may take over-the-counter medicines to relieve itching. °· Do not shave irritated  skin. °· Follow up with your caregiver as directed. °SEEK IMMEDIATE MEDICAL CARE IF:  °· You have increasing redness, swelling, or pain in the affected area. °· You have a fever. °MAKE SURE YOU: °· Understand these instructions. °· Will watch your condition. °· Will get help right away if you are not doing well or get worse. °  °This information is not intended to replace advice given to you by your health care provider. Make sure you discuss any questions you have with your health care provider. °  °Document Released: 11/22/2001 Document Revised: 10/04/2014 Document Reviewed: 12/14/2011 °Elsevier Interactive Patient Education ©2016 Elsevier Inc. ° °

## 2015-12-21 NOTE — ED Provider Notes (Signed)
[facility] Emergency Department Provider Note  ____________________________________________  Time seen: Approximately 11:12 AM  I have reviewed the triage vital signs and the nursing notes.   HISTORY  Chief Complaint Breast Problem    HPI Amy Pennington is a 35 y.o. female , NAD, presents to the emergency department with long-standing history of "knots" about bilateral breast. States this worsens if she has increased swelling or heat. Has noted 2 areas about the left breast in which when she squeezes these areas she notes"pus".  Denies redness, swelling, rashes about the breast. Has not used anything OTC to alleviate. Has not been to her primary care provider approximately 1.5 years for regular physical but at the last check her breast exam was negative. Denies any oozing or weeping from the nipples. Has not had any masses or swelling about the underarms or neck. Denies fever, chills, body aches. Has had no injury or trauma to the breast.   Past Medical History  Diagnosis Date  . Asthma   . GERD (gastroesophageal reflux disease)     There are no active problems to display for this patient.   History reviewed. No pertinent past surgical history.  Current Outpatient Rx  Name  Route  Sig  Dispense  Refill  . albuterol (PROVENTIL) (2.5 MG/3ML) 0.083% nebulizer solution   Nebulization   Take 2.5 mg by nebulization every 6 (six) hours as needed for wheezing or shortness of breath.         Marland Kitchen. albuterol-ipratropium (COMBIVENT) 18-103 MCG/ACT inhaler   Inhalation   Inhale 1 puff into the lungs every 4 (four) hours as needed for wheezing or shortness of breath.         . cephALEXin (KEFLEX) 500 MG capsule   Oral   Take 1 capsule (500 mg total) by mouth 2 (two) times daily.   14 capsule   0   . esomeprazole (NEXIUM) 40 MG capsule   Oral   Take 40 mg by mouth daily at 12 noon.         . fluticasone (FLOVENT HFA) 110 MCG/ACT inhaler   Inhalation  Inhale into the lungs 2 (two) times daily.         Marland Kitchen. ibuprofen (ADVIL,MOTRIN) 600 MG tablet   Oral   Take 1 tablet (600 mg total) by mouth every 6 (six) hours as needed.   30 tablet   0   . predniSONE (DELTASONE) 20 MG tablet   Oral   Take 2 tablets (40 mg total) by mouth daily. Take 40 mg by mouth daily for 3 days, then 20mg  by mouth daily for 3 days, then 10mg  daily for 3 days   12 tablet   0   . traMADol (ULTRAM) 50 MG tablet   Oral   Take 1 tablet (50 mg total) by mouth every 6 (six) hours as needed.   15 tablet   0     Allergies Review of patient's allergies indicates no known allergies.  No family history on file.  Social History Social History  Substance Use Topics  . Smoking status: Current Every Day Smoker  . Smokeless tobacco: None  . Alcohol Use: Yes     Review of Systems  Constitutional: No fever/chills, fatigue, night sweats Cardiovascular: No chest pain. Respiratory:  No shortness of breath. No wheezing.  Musculoskeletal: Negative for neck pain.  Skin: Positive skin sores left breast. Negative for rash. Neurological: Negative for headaches, focal weakness or numbness. 10-point ROS otherwise negative.  ____________________________________________   PHYSICAL EXAM:  VITAL SIGNS: ED Triage Vitals  Enc Vitals Group     BP 12/21/15 1013 142/76 mmHg     Pulse Rate 12/21/15 1013 98     Resp 12/21/15 1018 16     Temp 12/21/15 1013 98.4 F (36.9 C)     Temp Source 12/21/15 1013 Oral     SpO2 12/21/15 1013 99 %     Weight 12/21/15 1013 259 lb (117.482 kg)     Height 12/21/15 1013  (1.6 m)     Head Cir --      Peak Flow --      Pain Score 12/21/15 1019 6     Pain Loc --      Pain Edu? --      Excl. in GC? --    Physical exam completed in the presence of Currie Paris, ED Tech  Constitutional: Alert and oriented. Well appearing and in no acute distress. Eyes: Conjunctivae are normal.  Head: Atraumatic. Neck: Supple with full range of  motion. Hematological/Lymphatic/Immunilogical: No cervical, supraclavicular, axillary lymphadenopathy. Cardiovascular: Normal rate, regular rhythm. Normal S1 and S2.  Good peripheral circulation. Respiratory: Normal respiratory effort without tachypnea or retractions. Lungs CTAB with breath sounds noted throughout. Neurologic:  Normal speech and language. No gross focal neurologic deficits are appreciated.  Skin:  Left breast exam is benign with noting one area with a small 0.5 cm annular, mobile rubbery type mass noted at approximately the 7:00 area medial to the nipple. No oozing or weeping was noted. No other skin rashes about bilateral breast noted. No discharge noted from the nipples. Breasts are symmetric.  Psychiatric: Mood and affect are normal. Speech and behavior are normal. Patient exhibits appropriate insight and judgement.   ____________________________________________   LABS  None  ____________________________________________  EKG  None ____________________________________________  RADIOLOGY  none ____________________________________________    PROCEDURES  Procedure(s) performed: None    Medications - No data to display   ____________________________________________   INITIAL IMPRESSION / ASSESSMENT AND PLAN / ED COURSE  Patient's diagnosis is consistent with folliculitis. Anticipate recovery mass noted in the left breast is related to fibrocystic breast tissue. Patient has no adverse symptoms at this time to warrant further evaluation. Patient will be discharged home with prescriptions for Keflex to take as directed. Patient is also advised to utilize over-the-counter powder to decrease moisture in the breast area with increased heat or sweat. Patient is to follow up with [facility] clinic if symptoms persist past this treatment course. Patient also advised to make an appointment for her annual physical and breast exam. Patient is given ED  precautions to return to the ED for any worsening or new symptoms.    ____________________________________________  FINAL CLINICAL IMPRESSION(S) / ED DIAGNOSES  Final diagnoses:  Folliculitis      NEW MEDICATIONS STARTED DURING THIS VISIT:  Discharge Medication List as of 12/21/2015 11:20 AM    START taking these medications   Details  cephALEXin (KEFLEX) 500 MG capsule Take 1 capsule (500 mg total) by mouth 2 (two) times daily., Starting 12/21/2015, Until Sun 12/28/15, Print             Ernestene Kiel [place], PA-C 12/21/15 1237  Rockne Menghini, MD 12/21/15 1450

## 2015-12-21 NOTE — ED Notes (Signed)
Pt verbalized understanding of discharge instructions. NAD at this time. 

## 2016-01-15 ENCOUNTER — Encounter: Payer: Self-pay | Admitting: Emergency Medicine

## 2016-01-15 ENCOUNTER — Emergency Department
Admission: EM | Admit: 2016-01-15 | Discharge: 2016-01-15 | Disposition: A | Discharge status: Home / Self Care | Payer: Self-pay | Attending: Emergency Medicine | Admitting: Emergency Medicine

## 2016-01-15 DIAGNOSIS — F17200 Nicotine dependence, unspecified, uncomplicated: Secondary | ICD-10-CM | POA: Insufficient documentation

## 2016-01-15 DIAGNOSIS — M62830 Muscle spasm of back: Secondary | ICD-10-CM | POA: Insufficient documentation

## 2016-01-15 DIAGNOSIS — J45909 Unspecified asthma, uncomplicated: Secondary | ICD-10-CM | POA: Insufficient documentation

## 2016-01-15 DIAGNOSIS — Z79899 Other long term (current) drug therapy: Secondary | ICD-10-CM | POA: Insufficient documentation

## 2016-01-15 LAB — URINALYSIS COMPLETE WITH MICROSCOPIC (ARMC ONLY)
BACTERIA UA: NONE SEEN
Bilirubin Urine: NEGATIVE
GLUCOSE, UA: NEGATIVE mg/dL
Hgb urine dipstick: NEGATIVE
Ketones, ur: NEGATIVE mg/dL
Nitrite: NEGATIVE
PROTEIN: NEGATIVE mg/dL
Specific Gravity, Urine: 1.024 (ref 1.005–1.030)
pH: 5 (ref 5.0–8.0)

## 2016-01-15 MED ORDER — KETOROLAC TROMETHAMINE 10 MG PO TABS: 10.0000 mg | ORAL_TABLET | Freq: Three times a day (TID) | ORAL | Status: DC

## 2016-01-15 MED ORDER — KETOROLAC TROMETHAMINE 30 MG/ML IJ SOLN
30.0000 mg | Freq: Once | INTRAMUSCULAR | Status: AC
  Administered 2016-01-15: 30 mg via INTRAMUSCULAR
  Filled 2016-01-15: qty 1

## 2016-01-15 MED ORDER — ORPHENADRINE CITRATE ER 100 MG PO TB12: 100.0000 mg | ORAL_TABLET | Freq: Two times a day (BID) | ORAL | Status: DC | PRN

## 2016-01-15 NOTE — ED Notes (Signed)
Pt presents to ED with pain starting at her right knee radiating to her hip and lower back. Pt reports shooting pain and muscle spasms.

## 2016-01-15 NOTE — ED Notes (Signed)
Denies dysuria. States it feels like spasms to her back.

## 2016-01-15 NOTE — ED Provider Notes (Signed)
[facility] Emergency Department Provider Note ____________________________________________  Time seen: 1040  I have reviewed the triage vital signs and the nursing notes.  HISTORY  Chief Complaint  Back Pain  HPI Amy Pennington is a 35 y.o. female, presents to the emergency room with 4 day history of anterior right leg and right sided back pain. She states she first noticed the back pain two weeks ago but it went away within a day or two. The pain returned more severely 4 days ago. She has tried 600mg  ibuprofen and Flexeril to relieve the back pain but with no relief. She admits to increased frequency of urination but denies dysuria and urgency. She denies any distal paresthesias, foot drop, or incontinence of bladder or bowel. She describes the pain is not dissimilar from her previous episodes of muscle strain and spasm. She rates her overall discomfort and a 10/10 in triage.  Past Medical History  Diagnosis Date  . Asthma   . GERD (gastroesophageal reflux disease)     There are no active problems to display for this patient.   History reviewed. No pertinent past surgical history.  Current Outpatient Rx  Name  Route  Sig  Dispense  Refill  . albuterol (PROVENTIL) (2.5 MG/3ML) 0.083% nebulizer solution   Nebulization   Take 2.5 mg by nebulization every 6 (six) hours as needed for wheezing or shortness of breath.         Marland Kitchen. albuterol-ipratropium (COMBIVENT) 18-103 MCG/ACT inhaler   Inhalation   Inhale 1 puff into the lungs every 4 (four) hours as needed for wheezing or shortness of breath.         . esomeprazole (NEXIUM) 40 MG capsule   Oral   Take 40 mg by mouth daily at 12 noon.         . fluticasone (FLOVENT HFA) 110 MCG/ACT inhaler   Inhalation   Inhale into the lungs 2 (two) times daily.         Marland Kitchen. ibuprofen (ADVIL,MOTRIN) 600 MG tablet   Oral   Take 1 tablet (600 mg total) by mouth every 6 (six) hours as needed.   30 tablet   0   .  ketorolac (TORADOL) 10 MG tablet   Oral   Take 1 tablet (10 mg total) by mouth every 8 (eight) hours.   15 tablet   0   . orphenadrine (NORFLEX) 100 MG tablet   Oral   Take 1 tablet (100 mg total) by mouth 2 (two) times daily as needed for muscle spasms.   20 tablet   0   . predniSONE (DELTASONE) 20 MG tablet   Oral   Take 2 tablets (40 mg total) by mouth daily. Take 40 mg by mouth daily for 3 days, then 20mg  by mouth daily for 3 days, then 10mg  daily for 3 days   12 tablet   0   . traMADol (ULTRAM) 50 MG tablet   Oral   Take 1 tablet (50 mg total) by mouth every 6 (six) hours as needed.   15 tablet   0    Allergies Review of patient's allergies indicates no known allergies.  No family history on file.  Social History Social History  Substance Use Topics  . Smoking status: Current Every Day Smoker  . Smokeless tobacco: None  . Alcohol Use: Yes   Review of Systems  Constitutional: Negative for fever. Cardiovascular: Negative for chest pain. Respiratory: Negative for shortness of breath. Gastrointestinal: Negative for abdominal  pain, vomiting and diarrhea. Genitourinary:Positive for increased frequency. Negative for dysuria. Musculoskeletal: Positive for right lower back pain and right thigh pain. Skin: Negative for rash. Neurological: Negative for headaches, focal weakness or numbness. ____________________________________________  PHYSICAL EXAM:  VITAL SIGNS: ED Triage Vitals  Enc Vitals Group     BP 01/15/16 1004 140/95 mmHg     Pulse Rate 01/15/16 1004 80     Resp 01/15/16 1004 18     Temp 01/15/16 1004 98.2 F (36.8 C)     Temp Source 01/15/16 1004 Oral     SpO2 01/15/16 1004 96 %     Weight 01/15/16 1004 259 lb (117.482 kg)     Height 01/15/16 1004  (1.6 m)     Head Cir --      Peak Flow --      Pain Score 01/15/16 1007 10     Pain Loc --      Pain Edu? --      Excl. in GC? --    Constitutional: Alert and oriented. Well appearing and in no  distress. Head: Normocephalic and atraumatic. Cardiovascular: Normal rate, regular rhythm.  Respiratory: Normal respiratory effort. No wheezes/rales/rhonchi. Gastrointestinal: Soft and nontender. No distention. Musculoskeletal: Normal spinal alignment without midline tenderness, spasm, deformity, or step-off. Patient with mild TTP to the right lower back. Normal ROM with moderate pain. Right leg nontender to palpation. No difficulty with extension of the right leg normal patellar tracking and a benign knee exam otherwise. Nontender with normal range of motion in all extremities.  Neurologic:  Cranial nerves II through XII grossly intact. Normal LE DTRs bilaterally. Normal gait without ataxia. Normal speech and language. No gross focal neurologic deficits are appreciated. Skin:  Skin is warm, dry and intact. No rash noted. Psychiatric: Mood and affect are normal. Patient exhibits appropriate insight and judgment. ____________________________________________ LABS   Labs Reviewed  URINALYSIS COMPLETEWITH MICROSCOPIC ([facility] ONLY) - Abnormal; Notable for the following:    Color, Urine YELLOW (*)    APPearance HAZY (*)    Leukocytes, UA 1+ (*)    Squamous Epithelial / LPF 0-5 (*)    All other components within normal limits  ____________________________________________  INITIAL IMPRESSION / ASSESSMENT AND PLAN / ED COURSE  Muscle spasm of the back without neuromuscular deficit on exam. Given  IM Toradol injection today and prescribed Toradol and Norflex to go home with for pain. Patient advised to not drive or operate heavy machinery while taking medication. Advised to follow-up with [facility] clinic if pain persists or worsens.  ____________________________________________  FINAL CLINICAL IMPRESSION(S) / ED DIAGNOSES  Final diagnoses:  Muscle spasm of back     Lissa Hoard, PA-C 01/16/16 2219  Phineas Semen, MD 01/17/16 [phone]

## 2016-01-15 NOTE — Discharge Instructions (Signed)
Muscle Cramps and Spasms Muscle cramps and spasms are when muscles tighten by themselves. They usually get better within minutes. Muscle cramps are painful. They are usually stronger and last longer than muscle spasms. Muscle spasms may or may not be painful. They can last a few seconds or much longer. HOME CARE  Drink enough fluid to keep your pee (urine) clear or pale yellow.  Massage, stretch, and relax the muscle.  Use a warm towel, heating pad, or warm shower water on tight muscles.  Place ice on the muscle if it is tender or in pain.  Put ice in a plastic bag.  Place a towel between your skin and the bag.  Leave the ice on for 15-20 minutes, 03-04 times a day.  Only take medicine as told by your doctor. GET HELP RIGHT AWAY IF:  Your cramps or spasms get worse, happen more often, or do not get better with time. MAKE SURE YOU:  Understand these instructions.  Will watch your condition.  Will get help right away if you are not doing well or get worse.   This information is not intended to replace advice given to you by your health care provider. Make sure you discuss any questions you have with your health care provider.   Document Released: 08/26/2008 Document Revised: 01/08/2013 Document Reviewed: 08/30/2012 Elsevier Interactive Patient Education [employer]2016 Elsevier Inc.  Please do not drive or operate heavy machinery while taking Norflex, it can cause drowsiness. Do not lift heavy objects. [facility] is symptoms worsen or persist.

## 2016-03-19 DIAGNOSIS — N611 Abscess of the breast and nipple: Secondary | ICD-10-CM | POA: Insufficient documentation

## 2016-04-23 ENCOUNTER — Encounter: Payer: Self-pay | Admitting: Emergency Medicine

## 2016-04-23 ENCOUNTER — Emergency Department
Admission: EM | Admit: 2016-04-23 | Discharge: 2016-04-23 | Disposition: A | Discharge status: Home / Self Care | Payer: Self-pay | Attending: Emergency Medicine | Admitting: Emergency Medicine

## 2016-04-23 DIAGNOSIS — J45909 Unspecified asthma, uncomplicated: Secondary | ICD-10-CM | POA: Insufficient documentation

## 2016-04-23 DIAGNOSIS — Z7951 Long term (current) use of inhaled steroids: Secondary | ICD-10-CM | POA: Insufficient documentation

## 2016-04-23 DIAGNOSIS — R51 Headache: Secondary | ICD-10-CM

## 2016-04-23 DIAGNOSIS — Z8719 Personal history of other diseases of the digestive system: Secondary | ICD-10-CM | POA: Insufficient documentation

## 2016-04-23 DIAGNOSIS — F17200 Nicotine dependence, unspecified, uncomplicated: Secondary | ICD-10-CM | POA: Insufficient documentation

## 2016-04-23 DIAGNOSIS — H6692 Otitis media, unspecified, left ear: Secondary | ICD-10-CM | POA: Insufficient documentation

## 2016-04-23 DIAGNOSIS — R519 Headache, unspecified: Secondary | ICD-10-CM

## 2016-04-23 DIAGNOSIS — Z79899 Other long term (current) drug therapy: Secondary | ICD-10-CM | POA: Insufficient documentation

## 2016-04-23 DIAGNOSIS — R42 Dizziness and giddiness: Secondary | ICD-10-CM | POA: Insufficient documentation

## 2016-04-23 MED ORDER — MECLIZINE HCL 25 MG PO TABS: 25.0000 mg | ORAL_TABLET | Freq: Three times a day (TID) | ORAL | 0 refills | Status: DC | PRN

## 2016-04-23 MED ORDER — AMOXICILLIN-POT CLAVULANATE 875-125 MG PO TABS: 1.0000 | ORAL_TABLET | Freq: Two times a day (BID) | ORAL | 0 refills | Status: AC

## 2016-04-23 MED ORDER — AMOXICILLIN-POT CLAVULANATE 875-125 MG PO TABS: 1.0000 | ORAL_TABLET | Freq: Once | ORAL | Status: DC

## 2016-04-23 MED ORDER — MECLIZINE HCL 25 MG PO TABS: 25.0000 mg | ORAL_TABLET | Freq: Once | ORAL | Status: DC

## 2016-04-23 NOTE — ED Triage Notes (Signed)
Patient ambulatory to triage with steady gait, without difficulty or distress noted; pt reports frontal HA x 2 days with left earache

## 2016-04-23 NOTE — ED Provider Notes (Signed)
[facility] Emergency Department Provider Note   ____________________________________________   First MD Initiated Contact with Patient 04/23/16 [phone]     (approximate)  I have reviewed the triage vital signs and the nursing notes.   HISTORY  Chief Complaint Otalgia and Headache    HPI Amy Pennington is a 35 y.o. female with a recent course of clindamycin for breast abscess was presenting to the emergency department today for left ear pain radiating to her forehead as well as dizziness. She says that this pain is been ongoing for the past 3-4 days. She said the pain is concentrated to just behind her left ear. She says that the dizziness is present when she walks or moves her head. Denies any fevers. Says that she did have multiple ear infections as a child but never had tympanostomy tubes. Also saying that she has blurred vision but this is bilateral. Also has a headache history and says that she has had this in the past with previous headaches.Also states mild increased nasal congestion and frontal pressure to her face. Denies any sore throat. She says that her pain is a 7 out of 10 and the symptoms, including the headache came on gradually.   Past Medical History:  Diagnosis Date  . Asthma   . GERD (gastroesophageal reflux disease)     There are no active problems to display for this patient.   History reviewed. No pertinent surgical history.  Prior to Admission medications   Medication Sig Start Date End Date Taking? Authorizing Provider  albuterol (PROVENTIL) (2.5 MG/3ML) 0.083% nebulizer solution Take 2.5 mg by nebulization every 6 (six) hours as needed for wheezing or shortness of breath.    Historical Provider, MD  albuterol-ipratropium (COMBIVENT) 18-103 MCG/ACT inhaler Inhale 1 puff into the lungs every 4 (four) hours as needed for wheezing or shortness of breath.    Historical Provider, MD  esomeprazole (NEXIUM) 40 MG capsule Take 40 mg by  mouth daily at 12 noon.    Historical Provider, MD  fluticasone (FLOVENT HFA) 110 MCG/ACT inhaler Inhale into the lungs 2 (two) times daily.    Historical Provider, MD  ibuprofen (ADVIL,MOTRIN) 600 MG tablet Take 1 tablet (600 mg total) by mouth every 6 (six) hours as needed. 12/21/15   Jami L Hagler, PA-C  ketorolac (TORADOL) 10 MG tablet Take 1 tablet (10 mg total) by mouth every 8 (eight) hours. 01/15/16   Jenise V Bacon Menshew, PA-C  orphenadrine (NORFLEX) 100 MG tablet Take 1 tablet (100 mg total) by mouth 2 (two) times daily as needed for muscle spasms. 01/15/16   Jenise V Bacon Menshew, PA-C  predniSONE (DELTASONE) 20 MG tablet Take 2 tablets (40 mg total) by mouth daily. Take 40 mg by mouth daily for 3 days, then  by mouth daily for 3 days, then  daily for 3 days 01/16/14   Mellody Drown, PA-C  traMADol (ULTRAM) 50 MG tablet Take 1 tablet (50 mg total) by mouth every 6 (six) hours as needed. 05/22/14   Santiago Glad, PA-C    Allergies Review of patient's allergies indicates no known allergies.  No family history on file.  Social History Social History  Substance Use Topics  . Smoking status: Current Every Day Smoker  . Smokeless tobacco: Never Used  . Alcohol use Yes    Review of Systems Constitutional: No fever/chills Eyes: No visual changes. ENT: No sore throat. Cardiovascular: Denies chest pain. Respiratory: Denies shortness of breath. Gastrointestinal: No abdominal pain.  No nausea, no vomiting.  No diarrhea.  No constipation. Genitourinary: Negative for dysuria. Musculoskeletal: Negative for back pain. Skin: Negative for rash. Neurological: Negative for focal weakness or numbness.  10-point ROS otherwise negative.  ____________________________________________   PHYSICAL EXAM:  VITAL SIGNS: ED Triage Vitals  Enc Vitals Group     BP 04/23/16 0517 137/80     Pulse Rate 04/23/16 0517 82     Resp 04/23/16 0517 18     Temp 04/23/16 0517 98.2 F (36.8 C)      Temp Source 04/23/16 0517 Oral     SpO2 04/23/16 0517 98 %     Weight 04/23/16 0517 276 lb (125.2 kg)     Height 04/23/16 0517 5\' 3"  (1.6 m)     Head Circumference --      Peak Flow --      Pain Score 04/23/16 0522 7     Pain Loc --      Pain Edu? --      Excl. in GC? --     Constitutional: Alert and oriented. Well appearing and in no acute distress. Eyes: Conjunctivae are normal. PERRL. EOMI.No nystagmus. Head: Atraumatic.No tenderness to the bilateral mastoid processes. Tympanic membrane on the right is normal but the left side and tympanic membrane with the patient says that she has pain is bulging with mild erythema. No erythema externally to the ear or cranium. Mild pressure over the frontal as well as maxillary sinuses bilaterally. Nose: Mild bilateral congestion to the naris. Mouth/Throat: Mucous membranes are moist.  Oropharynx non-erythematous. Neck: No stridor.   Cardiovascular: Normal rate, regular rhythm. Grossly normal heart sounds.   Respiratory: Normal respiratory effort.  No retractions. Lungs CTAB. Gastrointestinal: Soft and nontender. No distention. Musculoskeletal: No lower extremity tenderness nor edema.  No joint effusions. Neurologic:  Normal speech and language. No gross focal neurologic deficits are appreciated. No gait instability.  Patient walks unassisted. Skin:  Skin is warm, dry and intact. No rash noted. Psychiatric: Mood and affect are normal. Speech and behavior are normal.  ____________________________________________   LABS (all labs ordered are listed, but only abnormal results are displayed)  Labs Reviewed - No data to display ____________________________________________  EKG   ____________________________________________  RADIOLOGY   ____________________________________________   PROCEDURES   Procedures   ____________________________________________   INITIAL IMPRESSION / ASSESSMENT AND PLAN / ED COURSE  Pertinent labs &  imaging results that were available during my care of the patient were reviewed by me and considered in my medical decision making (see chart for details).  Patient likely with left-sided otitis media with associated vertigo. Because the patient had a recent course of clindamycin earlier this month and will prescribe Augmentin. The patient was given coupon cigarette or Augmentin that both [facility], [facility]. She understands the plan and is willing to comply. We'll also give follow-up with the [facility] and the number for ear nose and throat.  Clinical Course     ____________________________________________   FINAL CLINICAL IMPRESSION(S) / ED DIAGNOSES  Final diagnoses:  None   Peripheral vertigo. Left-sided otitis media. Headache.   NEW MEDICATIONS STARTED DURING THIS VISIT:  New Prescriptions   No medications on file     Note:  This document was prepared using Dragon voice recognition software and may include unintentional dictation errors.    Myrna Blazer, MD 04/23/16 [phone]

## 2016-04-23 NOTE — ED Notes (Addendum)
Pt states headache and earache x 3 days. States she is "off-balance and when I push right here (pushing below L ear) the fluid is moving or draining." Pt appears in no distress, skin warm and dry. Denies N&V, diarrhea, fever. Pt states lights bother her and some blurred vision but denies movement bothering her. Pt laid down during assessment.

## 2016-05-18 DIAGNOSIS — J45909 Unspecified asthma, uncomplicated: Secondary | ICD-10-CM

## 2016-05-27 ENCOUNTER — Emergency Department: Payer: Self-pay

## 2016-05-27 ENCOUNTER — Emergency Department
Admission: EM | Admit: 2016-05-27 | Discharge: 2016-05-27 | Disposition: A | Discharge status: Home / Self Care | Payer: Self-pay | Attending: Emergency Medicine | Admitting: Emergency Medicine

## 2016-05-27 ENCOUNTER — Encounter: Payer: Self-pay | Admitting: *Deleted

## 2016-05-27 DIAGNOSIS — Z7952 Long term (current) use of systemic steroids: Secondary | ICD-10-CM | POA: Insufficient documentation

## 2016-05-27 DIAGNOSIS — W108XXA Fall (on) (from) other stairs and steps, initial encounter: Secondary | ICD-10-CM | POA: Insufficient documentation

## 2016-05-27 DIAGNOSIS — J45909 Unspecified asthma, uncomplicated: Secondary | ICD-10-CM | POA: Insufficient documentation

## 2016-05-27 DIAGNOSIS — N938 Other specified abnormal uterine and vaginal bleeding: Secondary | ICD-10-CM | POA: Insufficient documentation

## 2016-05-27 DIAGNOSIS — Y929 Unspecified place or not applicable: Secondary | ICD-10-CM | POA: Insufficient documentation

## 2016-05-27 DIAGNOSIS — M7918 Myalgia, other site: Secondary | ICD-10-CM

## 2016-05-27 DIAGNOSIS — F17210 Nicotine dependence, cigarettes, uncomplicated: Secondary | ICD-10-CM | POA: Insufficient documentation

## 2016-05-27 DIAGNOSIS — N939 Abnormal uterine and vaginal bleeding, unspecified: Secondary | ICD-10-CM

## 2016-05-27 DIAGNOSIS — R0781 Pleurodynia: Secondary | ICD-10-CM | POA: Insufficient documentation

## 2016-05-27 DIAGNOSIS — Z791 Long term (current) use of non-steroidal anti-inflammatories (NSAID): Secondary | ICD-10-CM | POA: Insufficient documentation

## 2016-05-27 DIAGNOSIS — Y999 Unspecified external cause status: Secondary | ICD-10-CM | POA: Insufficient documentation

## 2016-05-27 DIAGNOSIS — M791 Myalgia: Secondary | ICD-10-CM | POA: Insufficient documentation

## 2016-05-27 DIAGNOSIS — Z79899 Other long term (current) drug therapy: Secondary | ICD-10-CM | POA: Insufficient documentation

## 2016-05-27 DIAGNOSIS — Y9301 Activity, walking, marching and hiking: Secondary | ICD-10-CM | POA: Insufficient documentation

## 2016-05-27 LAB — CBC
HCT: 32.7 % — ABNORMAL LOW (ref 35.0–47.0)
HEMOGLOBIN: 10.6 g/dL — AB (ref 12.0–16.0)
MCH: 23.8 pg — AB (ref 26.0–34.0)
MCHC: 32.4 g/dL (ref 32.0–36.0)
MCV: 73.5 fL — AB (ref 80.0–100.0)
Platelets: 348 10*3/uL (ref 150–440)
RBC: 4.44 MIL/uL (ref 3.80–5.20)
RDW: 19.2 % — ABNORMAL HIGH (ref 11.5–14.5)
WBC: 7.1 10*3/uL (ref 3.6–11.0)

## 2016-05-27 LAB — URINALYSIS COMPLETE WITH MICROSCOPIC (ARMC ONLY)
Bacteria, UA: NONE SEEN
Bilirubin Urine: NEGATIVE
Glucose, UA: NEGATIVE mg/dL
Ketones, ur: NEGATIVE mg/dL
Nitrite: NEGATIVE
PH: 7 (ref 5.0–8.0)
PROTEIN: 30 mg/dL — AB
Specific Gravity, Urine: 1.023 (ref 1.005–1.030)

## 2016-05-27 LAB — BASIC METABOLIC PANEL
ANION GAP: 7 (ref 5–15)
BUN: 7 mg/dL (ref 6–20)
CALCIUM: 8.8 mg/dL — AB (ref 8.9–10.3)
CO2: 25 mmol/L (ref 22–32)
Chloride: 104 mmol/L (ref 101–111)
Creatinine, Ser: 0.66 mg/dL (ref 0.44–1.00)
GLUCOSE: 108 mg/dL — AB (ref 65–99)
Potassium: 3.8 mmol/L (ref 3.5–5.1)
Sodium: 136 mmol/L (ref 135–145)

## 2016-05-27 LAB — POCT PREGNANCY, URINE: Preg Test, Ur: NEGATIVE

## 2016-05-27 MED ORDER — TRAMADOL HCL 50 MG PO TABS: 50.0000 mg | ORAL_TABLET | Freq: Four times a day (QID) | ORAL | 0 refills | Status: DC | PRN

## 2016-05-27 NOTE — ED Notes (Signed)
MD at bedside. 

## 2016-05-27 NOTE — ED Triage Notes (Signed)
Pt also reports having vag bleeding for last several weeks

## 2016-05-27 NOTE — ED Provider Notes (Signed)
[facility] Emergency Department Provider Note  Time seen: 10:08 PM  I have reviewed the triage vital signs and the nursing notes.   HISTORY  Chief Complaint Rib Injury and Vaginal Bleeding    HPI Amy Pennington is a 35 y.o. female presents to the emergency department with left-sided chest pain after fall down the stairs 2 days ago. According to the patient she was walking down the stairs when she slipped falling onto her left side. States for the past 2 days she has had pain in her left lower ribs, worse with movement. Patient also states her period which typically last 8-10 days is now on his 12 day, but states she was approximately 1 week late. Denies a possibility of pregnancy. Denies any lightheadedness or syncope. Denies any trouble breathing, but states some pain in the left lower ribs with deep inspiration. Denies any abdominal pain.  Past Medical History:  Diagnosis Date  . Asthma   . Bipolar 1 disorder (HCC)   . Depression   . GERD (gastroesophageal reflux disease)   . Migraines   . Schizo affective schizophrenia [facility])     Patient Active Problem List   Diagnosis Date Noted  . Breast abscess 03/19/2016  . Asthma 09/08/2013    No past surgical history on file.  Prior to Admission medications   Medication Sig Start Date End Date Taking? Authorizing Provider  albuterol (PROVENTIL) (2.5 MG/3ML) 0.083% nebulizer solution Take 2.5 mg by nebulization every 6 (six) hours as needed for wheezing or shortness of breath.    Historical Provider, MD  albuterol-ipratropium (COMBIVENT) 18-103 MCG/ACT inhaler Inhale 1 puff into the lungs every 4 (four) hours as needed for wheezing or shortness of breath.    Historical Provider, MD  esomeprazole (NEXIUM) 40 MG capsule Take 40 mg by mouth daily at 12 noon.    Historical Provider, MD  fluticasone (FLOVENT HFA) 110 MCG/ACT inhaler Inhale into the lungs 2 (two) times daily.    Historical Provider, MD  ibuprofen  (ADVIL,MOTRIN) 600 MG tablet Take 1 tablet (600 mg total) by mouth every 6 (six) hours as needed. 12/21/15   Jami L Hagler, PA-C  ketorolac (TORADOL) 10 MG tablet Take 1 tablet (10 mg total) by mouth every 8 (eight) hours. 01/15/16   Jenise V Bacon Menshew, PA-C  meclizine (ANTIVERT) 25 MG tablet Take 1 tablet (25 mg total) by mouth 3 (three) times daily as needed for dizziness. 04/23/16   Myrna Blazer, MD  orphenadrine (NORFLEX) 100 MG tablet Take 1 tablet (100 mg total) by mouth 2 (two) times daily as needed for muscle spasms. 01/15/16   Jenise V Bacon Menshew, PA-C  predniSONE (DELTASONE) 20 MG tablet Take 2 tablets (40 mg total) by mouth daily. Take 40 mg by mouth daily for 3 days, then 20mg  by mouth daily for 3 days, then 10mg  daily for 3 days 01/16/14   Mellody Drown, PA-C  traMADol (ULTRAM) 50 MG tablet Take 1 tablet (50 mg total) by mouth every 6 (six) hours as needed. 05/22/14   Santiago Glad, PA-C    No Known Allergies  Family History  Problem Relation Age of Onset  . Adopted: Yes  . Asthma Mother   . Depression Mother   . Diabetes Brother     Social History Social History  Substance Use Topics  . Smoking status: Current Every Day Smoker  . Smokeless tobacco: Never Used  . Alcohol use Yes    Review of Systems Constitutional: Negative  for fever Cardiovascular: Left lower lateral chest wall pain. Respiratory: Negative for shortness of breath. Gastrointestinal: Negative for abdominal pain Genitourinary: Negative for dysuria. Positive for vaginal bleeding Neurological: Negative for headache 10-point ROS otherwise negative.  ____________________________________________   PHYSICAL EXAM:  VITAL SIGNS: ED Triage Vitals  Enc Vitals Group     BP 05/27/16 2041 131/75     Pulse Rate 05/27/16 2041 75     Resp 05/27/16 2041 17     Temp 05/27/16 2041 98.4 F (36.9 C)     Temp Source 05/27/16 2041 Oral     SpO2 05/27/16 2041 99 %     Weight 05/27/16 2040 276 lb  (125.2 kg)     Height 05/27/16 2040 5\' 3"  (1.6 m)     Head Circumference --      Peak Flow --      Pain Score 05/27/16 2154 7     Pain Loc --      Pain Edu? --      Excl. in GC? --     Constitutional: Alert and oriented. Well appearing and in no distress. Eyes: Normal exam ENT   Head: Normocephalic and atraumatic   Mouth/Throat: Mucous membranes are moist. Cardiovascular: Normal rate, regular rhythm. No murmur Respiratory: Normal respiratory effort without tachypnea nor retractions. Breath sounds are clear. Moderate tenderness palpation of the left lateral lower ribs. Gastrointestinal: Soft and nontender. No distention.  There is no CVA tenderness. Musculoskeletal: Nontender with normal range of motion in all extremities.  Neurologic:  Normal speech and language. No gross focal neurologic deficits  Skin:  Skin is warm, dry and intact.  Psychiatric: Mood and affect are normal.   ____________________________________________   RADIOLOGY  Chest x-ray negative  ____________________________________________   INITIAL IMPRESSION / ASSESSMENT AND PLAN / ED COURSE  Pertinent labs & imaging results that were available during my care of the patient were reviewed by me and considered in my medical decision making (see chart for details).  The patient presents emergency Department with multiple complaints, the most concerning of which to the patient is a fall 2 days ago with left lateral lower rib pain. She also states her period has been somewhat prolonged. We will check labs, chest x-ray, and monitor in the emergency department while awaiting results.  Chest x-ray negative. Urinalysis shows red blood cells, no bacteria. I have added on a culture. We will discharge home with Ultram as needed for discomfort and PCP follow-up.    ____________________________________________   FINAL CLINICAL IMPRESSION(S) / ED DIAGNOSES Fall Dysfunction uterine bleeding    Minna AntisKevin Torrence Hammack,  MD 05/27/16 2234

## 2016-05-27 NOTE — ED Triage Notes (Signed)
Pt reports falling down 5 steps 2 days ago.  Pt has left side rib pain.  No loc.  Pain with ambulation.   Pt alert.

## 2016-09-10 ENCOUNTER — Encounter: Payer: Self-pay | Admitting: Emergency Medicine

## 2016-09-10 ENCOUNTER — Emergency Department: Payer: Self-pay

## 2016-09-10 DIAGNOSIS — K219 Gastro-esophageal reflux disease without esophagitis: Secondary | ICD-10-CM | POA: Insufficient documentation

## 2016-09-10 DIAGNOSIS — Z79899 Other long term (current) drug therapy: Secondary | ICD-10-CM | POA: Insufficient documentation

## 2016-09-10 DIAGNOSIS — J45901 Unspecified asthma with (acute) exacerbation: Secondary | ICD-10-CM | POA: Insufficient documentation

## 2016-09-10 DIAGNOSIS — K5900 Constipation, unspecified: Secondary | ICD-10-CM | POA: Insufficient documentation

## 2016-09-10 DIAGNOSIS — Z7951 Long term (current) use of inhaled steroids: Secondary | ICD-10-CM | POA: Insufficient documentation

## 2016-09-10 DIAGNOSIS — J069 Acute upper respiratory infection, unspecified: Principal | ICD-10-CM | POA: Insufficient documentation

## 2016-09-10 DIAGNOSIS — F17200 Nicotine dependence, unspecified, uncomplicated: Secondary | ICD-10-CM | POA: Insufficient documentation

## 2016-09-10 LAB — BASIC METABOLIC PANEL
Anion gap: 8 (ref 5–15)
BUN: 6 mg/dL (ref 6–20)
CALCIUM: 9.3 mg/dL (ref 8.9–10.3)
CO2: 22 mmol/L (ref 22–32)
Chloride: 105 mmol/L (ref 101–111)
Creatinine, Ser: 0.72 mg/dL (ref 0.44–1.00)
GFR calc Af Amer: >60 mL/min (ref >60)
GLUCOSE: 117 mg/dL — AB (ref 65–99)
POTASSIUM: 3.5 mmol/L (ref 3.5–5.1)
Sodium: 135 mmol/L (ref 135–145)

## 2016-09-10 LAB — CBC
HEMATOCRIT: 28.5 % — AB (ref 35.0–47.0)
Hemoglobin: 9.1 g/dL — ABNORMAL LOW (ref 12.0–16.0)
MCH: 20.4 pg — ABNORMAL LOW (ref 26.0–34.0)
MCHC: 32 g/dL (ref 32.0–36.0)
MCV: 63.9 fL — ABNORMAL LOW (ref 80.0–100.0)
Platelets: 428 10*3/uL (ref 150–440)
RBC: 4.45 MIL/uL (ref 3.80–5.20)
RDW: 18.9 % — AB (ref 11.5–14.5)
WBC: 9.2 10*3/uL (ref 3.6–11.0)

## 2016-09-10 LAB — TROPONIN I: Troponin I: <0.03 ng/mL (ref <0.03)

## 2016-09-10 MED ORDER — ALBUTEROL SULFATE (2.5 MG/3ML) 0.083% IN NEBU
INHALATION_SOLUTION | RESPIRATORY_TRACT | Status: AC
  Administered 2016-09-10: 5 mg via RESPIRATORY_TRACT
  Filled 2016-09-10: qty 6

## 2016-09-10 MED ORDER — ALBUTEROL SULFATE (2.5 MG/3ML) 0.083% IN NEBU
5.0000 mg | INHALATION_SOLUTION | Freq: Once | RESPIRATORY_TRACT | Status: AC
  Administered 2016-09-10: 5 mg via RESPIRATORY_TRACT

## 2016-09-10 NOTE — ED Triage Notes (Addendum)
Pt ambulatory to triage in obvious SOB, pt reports SOB, cough, body aches, x 3 days.  Pt hx asthma.  Pt also c/o chest pain

## 2016-09-11 ENCOUNTER — Observation Stay
Admission: EM | Admit: 2016-09-11 | Discharge: 2016-09-12 | Disposition: A | Discharge status: Home / Self Care | Payer: Self-pay | Attending: Internal Medicine | Admitting: Internal Medicine

## 2016-09-11 DIAGNOSIS — J45901 Unspecified asthma with (acute) exacerbation: Secondary | ICD-10-CM | POA: Diagnosis present

## 2016-09-11 DIAGNOSIS — J069 Acute upper respiratory infection, unspecified: Secondary | ICD-10-CM

## 2016-09-11 LAB — URINALYSIS, COMPLETE (UACMP) WITH MICROSCOPIC
Bilirubin Urine: NEGATIVE
Glucose, UA: NEGATIVE mg/dL
Hgb urine dipstick: NEGATIVE
Ketones, ur: NEGATIVE mg/dL
NITRITE: NEGATIVE
PH: 6 (ref 5.0–8.0)
Protein, ur: NEGATIVE mg/dL
SPECIFIC GRAVITY, URINE: 1.02 (ref 1.005–1.030)

## 2016-09-11 LAB — TSH: TSH: 1.31 u[IU]/mL (ref 0.350–4.500)

## 2016-09-11 LAB — POCT PREGNANCY, URINE: PREG TEST UR: NEGATIVE

## 2016-09-11 MED ORDER — METHYLPREDNISOLONE SODIUM SUCC 125 MG IJ SOLR
125.0000 mg | Freq: Once | INTRAMUSCULAR | Status: AC
  Administered 2016-09-11: 125 mg via INTRAVENOUS
  Filled 2016-09-11: qty 2

## 2016-09-11 MED ORDER — SALINE SPRAY 0.65 % NA SOLN
1.0000 | NASAL | Status: DC | PRN
  Administered 2016-09-11: 1 via NASAL
  Filled 2016-09-11: qty 44

## 2016-09-11 MED ORDER — ONDANSETRON HCL 4 MG/2ML IJ SOLN: 4.0000 mg | Freq: Four times a day (QID) | INTRAMUSCULAR | Status: DC | PRN

## 2016-09-11 MED ORDER — ALBUTEROL SULFATE (2.5 MG/3ML) 0.083% IN NEBU
2.5000 mg | INHALATION_SOLUTION | RESPIRATORY_TRACT | Status: DC
  Administered 2016-09-11: 08:00:00 2.5 mg via RESPIRATORY_TRACT
  Filled 2016-09-11: qty 3

## 2016-09-11 MED ORDER — ORPHENADRINE CITRATE ER 100 MG PO TB12
100.0000 mg | ORAL_TABLET | Freq: Two times a day (BID) | ORAL | Status: DC | PRN
  Filled 2016-09-11: qty 1

## 2016-09-11 MED ORDER — PREDNISONE 5 MG PO TABS: 30.0000 mg | ORAL_TABLET | Freq: Every day | ORAL | Status: DC

## 2016-09-11 MED ORDER — KETOROLAC TROMETHAMINE 30 MG/ML IJ SOLN
10.0000 mg | Freq: Once | INTRAMUSCULAR | Status: AC
  Administered 2016-09-11: 9.9 mg via INTRAVENOUS
  Filled 2016-09-11: qty 1

## 2016-09-11 MED ORDER — IPRATROPIUM-ALBUTEROL 0.5-2.5 (3) MG/3ML IN SOLN
3.0000 mL | RESPIRATORY_TRACT | Status: DC
  Administered 2016-09-11 – 2016-09-12 (×6): 3 mL via RESPIRATORY_TRACT
  Filled 2016-09-11 (×6): qty 3

## 2016-09-11 MED ORDER — DOCUSATE SODIUM 100 MG PO CAPS: 100.0000 mg | ORAL_CAPSULE | Freq: Two times a day (BID) | ORAL | Status: DC

## 2016-09-11 MED ORDER — NICOTINE 21 MG/24HR TD PT24
21.0000 mg | MEDICATED_PATCH | Freq: Every day | TRANSDERMAL | Status: DC
  Administered 2016-09-11 – 2016-09-12 (×3): 21 mg via TRANSDERMAL
  Filled 2016-09-11 (×3): qty 1

## 2016-09-11 MED ORDER — PREDNISONE 5 MG PO TABS
50.0000 mg | ORAL_TABLET | Freq: Every day | ORAL | Status: AC
  Administered 2016-09-11: 50 mg via ORAL
  Filled 2016-09-11: qty 2

## 2016-09-11 MED ORDER — IPRATROPIUM-ALBUTEROL 0.5-2.5 (3) MG/3ML IN SOLN
3.0000 mL | Freq: Once | RESPIRATORY_TRACT | Status: AC
  Administered 2016-09-11: 3 mL via RESPIRATORY_TRACT
  Filled 2016-09-11: qty 3

## 2016-09-11 MED ORDER — LACTULOSE 10 GM/15ML PO SOLN
20.0000 g | Freq: Three times a day (TID) | ORAL | Status: DC
  Administered 2016-09-11 (×3): 20 g via ORAL
  Filled 2016-09-11 (×5): qty 30

## 2016-09-11 MED ORDER — SENNOSIDES-DOCUSATE SODIUM 8.6-50 MG PO TABS
1.0000 | ORAL_TABLET | Freq: Two times a day (BID) | ORAL | Status: DC
  Administered 2016-09-11 (×2): 1 via ORAL
  Filled 2016-09-11 (×4): qty 1

## 2016-09-11 MED ORDER — ALBUTEROL SULFATE (2.5 MG/3ML) 0.083% IN NEBU: 2.5000 mg | INHALATION_SOLUTION | Freq: Four times a day (QID) | RESPIRATORY_TRACT | Status: DC

## 2016-09-11 MED ORDER — PREDNISONE 20 MG PO TABS
40.0000 mg | ORAL_TABLET | Freq: Every day | ORAL | Status: AC
  Administered 2016-09-12: 10:00:00 40 mg via ORAL
  Filled 2016-09-11: qty 2

## 2016-09-11 MED ORDER — GUAIFENESIN-CODEINE 100-10 MG/5ML PO SOLN
10.0000 mL | ORAL | Status: DC | PRN
  Administered 2016-09-11 – 2016-09-12 (×3): 10 mL via ORAL
  Filled 2016-09-11 (×3): qty 10

## 2016-09-11 MED ORDER — PREDNISONE 5 MG PO TABS: 10.0000 mg | ORAL_TABLET | Freq: Every day | ORAL | Status: DC

## 2016-09-11 MED ORDER — ACETAMINOPHEN 325 MG PO TABS: 650.0000 mg | ORAL_TABLET | Freq: Four times a day (QID) | ORAL | Status: DC | PRN

## 2016-09-11 MED ORDER — ENOXAPARIN SODIUM 40 MG/0.4ML ~~LOC~~ SOLN
40.0000 mg | Freq: Two times a day (BID) | SUBCUTANEOUS | Status: DC
  Administered 2016-09-11: 40 mg via SUBCUTANEOUS
  Filled 2016-09-11 (×3): qty 0.4

## 2016-09-11 MED ORDER — PANTOPRAZOLE SODIUM 40 MG PO TBEC
40.0000 mg | DELAYED_RELEASE_TABLET | Freq: Every day | ORAL | Status: DC
  Administered 2016-09-11 – 2016-09-12 (×2): 40 mg via ORAL
  Filled 2016-09-11 (×2): qty 1

## 2016-09-11 MED ORDER — MECLIZINE HCL 25 MG PO TABS: 25.0000 mg | ORAL_TABLET | Freq: Three times a day (TID) | ORAL | Status: DC | PRN

## 2016-09-11 MED ORDER — ONDANSETRON HCL 4 MG PO TABS: 4.0000 mg | ORAL_TABLET | Freq: Four times a day (QID) | ORAL | Status: DC | PRN

## 2016-09-11 MED ORDER — TRAMADOL HCL 50 MG PO TABS: 50.0000 mg | ORAL_TABLET | Freq: Four times a day (QID) | ORAL | Status: DC | PRN

## 2016-09-11 MED ORDER — PREDNISONE 20 MG PO TABS: 20.0000 mg | ORAL_TABLET | Freq: Every day | ORAL | Status: DC

## 2016-09-11 MED ORDER — ACETAMINOPHEN 650 MG RE SUPP: 650.0000 mg | Freq: Four times a day (QID) | RECTAL | Status: DC | PRN

## 2016-09-11 MED ORDER — IPRATROPIUM-ALBUTEROL 0.5-2.5 (3) MG/3ML IN SOLN
3.0000 mL | Freq: Four times a day (QID) | RESPIRATORY_TRACT | Status: DC | PRN
Start: 2016-09-11 — End: 2016-09-11

## 2016-09-11 MED ORDER — MOMETASONE FURO-FORMOTEROL FUM 200-5 MCG/ACT IN AERO
2.0000 | INHALATION_SPRAY | Freq: Two times a day (BID) | RESPIRATORY_TRACT | Status: DC
  Administered 2016-09-11 – 2016-09-12 (×3): 2 via RESPIRATORY_TRACT
  Filled 2016-09-11: qty 8.8

## 2016-09-11 MED ORDER — SODIUM CHLORIDE 0.9 % IV BOLUS (SEPSIS)
1000.0000 mL | Freq: Once | INTRAVENOUS | Status: AC
  Administered 2016-09-11: 1000 mL via INTRAVENOUS

## 2016-09-11 MED ORDER — DIPHENHYDRAMINE HCL 25 MG PO CAPS
25.0000 mg | ORAL_CAPSULE | Freq: Every evening | ORAL | Status: DC | PRN
  Administered 2016-09-11: 22:00:00 50 mg via ORAL
  Filled 2016-09-11: qty 2

## 2016-09-11 NOTE — H&P (Signed)
Amy Pennington is an 35 y.o. female.   Chief Complaint: Shortness of breath HPI: The patient with past medical history of asthma presents emergency department complaining of shortness of breath. She states that for the last 2 weeks she feels as if she had been developing an upper respiratory infection and had varying degrees of cough with and without sputum production and shortness of breath. Deny prior to admission the patient states that she was no longer getting relief from her nebulizers (which she has run out of this time) so she decided to seek evaluation in the emergency department. She received a total of 4 DuoNeb's but continued to have significant wheezing and tachypnea with ambulation which prompted the emergency department staff to call the hospitalist service for admission.  Past Medical History:  Diagnosis Date  . Asthma   . Bipolar 1 disorder ([place])   . Depression   . GERD (gastroesophageal reflux disease)   . Migraines   . Schizo affective schizophrenia [facility])     Past Surgical History:  Procedure Laterality Date  . none      Family History  Problem Relation Age of Onset  . Adopted: Yes  . Asthma Mother   . Depression Mother   . Diabetes Brother    Social History:  reports that she has been smoking Cigarettes.  She has a 15.00 pack-year smoking history. She has never used smokeless tobacco. She reports that she drinks about 0.6 oz of alcohol per week . She reports that she uses drugs, including Cocaine and Marijuana, about 2 times per week.  Allergies: No Known Allergies  Medications Prior to Admission  Medication Sig Dispense Refill  . albuterol (PROVENTIL HFA;VENTOLIN HFA) 108 (90 Base) MCG/ACT inhaler Inhale 2 puffs into the lungs every 6 (six) hours as needed for wheezing or shortness of breath.      Results for orders placed or performed during the hospital encounter of 09/11/16 (from the past 48 hour(s))  Basic metabolic panel     Status: Abnormal   Collection Time: 09/10/16  9:12 PM  Result Value Ref Range   Sodium 135 135 - 145 mmol/L   Potassium 3.5 3.5 - 5.1 mmol/L   Chloride 105 101 - 111 mmol/L   CO2 22 22 - 32 mmol/L   Glucose, Bld 117 (H) 65 - 99 mg/dL   BUN 6 6 - 20 mg/dL   Creatinine, Ser 0.72 0.44 - 1.00 mg/dL   Calcium 9.3 8.9 - 10.3 mg/dL   GFR calc non Af Amer >60 >60 mL/min   GFR calc Af Amer >60 >60 mL/min    Comment: (NOTE) The eGFR has been calculated using the CKD EPI equation. This calculation has not been validated in all clinical situations. eGFR's persistently <60 mL/min signify possible Chronic Kidney Disease.    Anion gap 8 5 - 15  CBC     Status: Abnormal   Collection Time: 09/10/16  9:12 PM  Result Value Ref Range   WBC 9.2 3.6 - 11.0 K/uL   RBC 4.45 3.80 - 5.20 MIL/uL   Hemoglobin 9.1 (L) 12.0 - 16.0 g/dL   HCT 28.5 (L) 35.0 - 47.0 %   MCV 63.9 (L) 80.0 - 100.0 fL   MCH 20.4 (L) 26.0 - 34.0 pg   MCHC 32.0 32.0 - 36.0 g/dL   RDW 18.9 (H) 11.5 - 14.5 %   Platelets 428 150 - 440 K/uL  Troponin I     Status: None   Collection Time:  09/10/16  9:12 PM  Result Value Ref Range   Troponin I <0.03 <0.03 ng/mL  TSH     Status: None   Collection Time: 09/10/16  9:12 PM  Result Value Ref Range   TSH 1.310 0.350 - 4.500 uIU/mL    Comment: Performed by a 3rd Generation assay with a functional sensitivity of <=0.01 uIU/mL.  Urinalysis, Complete w Microscopic     Status: Abnormal   Collection Time: 09/11/16 12:57 AM  Result Value Ref Range   Color, Urine YELLOW (A) YELLOW   APPearance CLEAR (A) CLEAR   Specific Gravity, Urine 1.020 1.005 - 1.030   pH 6.0 5.0 - 8.0   Glucose, UA NEGATIVE NEGATIVE mg/dL   Hgb urine dipstick NEGATIVE NEGATIVE   Bilirubin Urine NEGATIVE NEGATIVE   Ketones, ur NEGATIVE NEGATIVE mg/dL   Protein, ur NEGATIVE NEGATIVE mg/dL   Nitrite NEGATIVE NEGATIVE   Leukocytes, UA MODERATE (A) NEGATIVE   RBC / HPF 0-5 0 - 5 RBC/hpf   WBC, UA TOO NUMEROUS TO COUNT 0 - 5 WBC/hpf    Bacteria, UA RARE (A) NONE SEEN   Squamous Epithelial / LPF 0-5 (A) NONE SEEN   Mucous PRESENT   Pregnancy, urine POC     Status: None   Collection Time: 09/11/16  1:05 AM  Result Value Ref Range   Preg Test, Ur NEGATIVE NEGATIVE    Comment:        THE SENSITIVITY OF THIS METHODOLOGY IS >24 mIU/mL    Dg Chest 2 View  Result Date: 09/10/2016 CLINICAL DATA:  35 y/o  F; shortness of breath. EXAM: CHEST  2 VIEW COMPARISON:  05/27/2016 chest radiograph FINDINGS: The heart size and mediastinal contours are within normal limits and stable. Both lungs are clear. The visualized skeletal structures are unremarkable. IMPRESSION: No active cardiopulmonary disease. Electronically Signed   By: Kristine Garbe M.D.   On: 09/10/2016 22:11    Review of Systems  Constitutional: Negative for chills and fever.  HENT: Negative for sore throat and tinnitus.   Eyes: Negative for blurred vision and redness.  Respiratory: Positive for cough, shortness of breath and wheezing.   Cardiovascular: Negative for chest pain, palpitations, orthopnea and PND.  Gastrointestinal: Positive for constipation (chronic). Negative for abdominal pain, diarrhea, nausea and vomiting.  Genitourinary: Negative for dysuria, frequency and urgency.  Musculoskeletal: Negative for joint pain and myalgias.  Skin: Negative for rash.       No lesions  Neurological: Negative for speech change, focal weakness and weakness.  Endo/Heme/Allergies: Does not bruise/bleed easily.       No temperature intolerance  Psychiatric/Behavioral: Negative for depression and suicidal ideas.    Blood pressure (!) 141/85, pulse (!) 101, temperature 97.6 F (36.4 C), temperature source Oral, resp. rate (!) 22, height '5\' 3"'  (1.6 m), weight 123.2 kg (271 lb 11.2 oz), last menstrual period 08/21/2016, SpO2 98 %. Physical Exam  Vitals reviewed. Constitutional: She is oriented to person, place, and time. She appears well-developed and well-nourished.  No distress.  HENT:  Head: Normocephalic and atraumatic.  Mouth/Throat: Oropharynx is clear and moist.  Eyes: Conjunctivae and EOM are normal. Pupils are equal, round, and reactive to light. No scleral icterus.  Neck: Normal range of motion. Neck supple. No JVD present. No tracheal deviation present. No thyromegaly present.  Cardiovascular: Normal rate, regular rhythm and normal heart sounds.  Exam reveals no gallop and no friction rub.   No murmur heard. Respiratory: Effort normal. She has wheezes (left more  than right) in the right middle field and the left upper field.  GI: Soft. Bowel sounds are normal. She exhibits no distension. There is no tenderness.  Genitourinary:  Genitourinary Comments: Deferred  Musculoskeletal: Normal range of motion. She exhibits no edema.  Lymphadenopathy:    She has no cervical adenopathy.  Neurological: She is alert and oriented to person, place, and time. No cranial nerve deficit. She exhibits normal muscle tone.  Skin: Skin is warm and dry. No rash noted. No erythema.  Psychiatric: She has a normal mood and affect. Her behavior is normal. Judgment and thought content normal.     Assessment/Plan This is a 35 year old female admitted for asthma exacerbation. 1. Asthma exacerbation: The patient is not been hospitalized secondary to asthma for 2 years. Triggers are multifactorial: Likely viral upper respiratory infection as well as smoking. She is received Solu-Medrol in the emergency department and we'll continue oral steroid taper. I have changed her inhaler regimen to include a long-acting bronchial agonist in addition to inhaled corticosteroid, albuterol as well as anticholinergic agent. No hypoxia 2. Constipation: The patient states that she has not had a bowel movement in approximately 2 weeks. Start lactulose as well as senna with Colace. 3. Tobacco abuse: Contributes to poor asthma control. NicoDerm patch while hospitalized. Cessation counseling prior  to discharge. 4. DVT prophylaxis: Lovenox 5. GI prophylaxis: None The patient is a full code. Time spent on admission orders and patient care approximately 45 minutes   Harrie Foreman, MD 09/11/2016, 6:52 AM

## 2016-09-11 NOTE — Progress Notes (Signed)
Anticoagulation monitoring(Lovenox):   35 yo female ordered Lovenox 40 mg Q24h  Filed Weights   09/10/16 2108 09/11/16 0352  Weight: 267 lb (121.1 kg) 271 lb 11.2 oz (123.2 kg)   BMI 48.2    Lab Results  Component Value Date   CREATININE 0.72 09/10/2016   CREATININE 0.66 05/27/2016   CREATININE 0.83 11/23/2013   Estimated Creatinine Clearance: 125 mL/min (by C-G formula based on SCr of 0.72 mg/dL). Hemoglobin & Hematocrit     Component Value Date/Time   HGB 9.1 (L) 09/10/2016 2112   HGB 11.4 (L) 11/23/2013 0138   HCT 28.5 (L) 09/10/2016 2112   HCT 35.2 11/23/2013 0138     Per Protocol for Patient with estCrcl > 30 ml/min and BMI > 40, will transition to Lovenox 40 mg Q12h.

## 2016-09-11 NOTE — ED Provider Notes (Signed)
[facility] Emergency Department Provider Note   ____________________________________________   First MD Initiated Contact with Patient 09/11/16 0034     (approximate)  I have reviewed the triage vital signs and the nursing notes.   HISTORY  Chief Complaint Shortness of Breath    HPI Amy Pennington is a 35 y.o. female who presents to the ED from home with a chief complaint of cough, shortness of breath, body aches. Patient has a history of asthma, never intubated, last hospitalized 2 years ago. Complains of a 3 day history of nonproductive cough, wheezing, shortness of breath, generalized malaise/bodyaches and chest tightness. Reports having to use albuterol MDI often. Denies associated fever, chills, abdominal pain, nausea, vomiting, diarrhea.Denies recent travel or trauma. Nothing makes her symptoms better or worse.   Past Medical History:  Diagnosis Date  . Asthma   . Bipolar 1 disorder (HCC)   . Depression   . GERD (gastroesophageal reflux disease)   . Migraines   . Schizo affective schizophrenia [facility](HCC)     Patient Active Problem List   Diagnosis Date Noted  . Breast abscess 03/19/2016  . Asthma 09/08/2013    History reviewed. No pertinent surgical history.  Prior to Admission medications   Medication Sig Start Date End Date Taking? Authorizing Provider  albuterol (PROVENTIL) (2.5 MG/3ML) 0.083% nebulizer solution Take 2.5 mg by nebulization every 6 (six) hours as needed for wheezing or shortness of breath.    Historical Provider, MD  albuterol-ipratropium (COMBIVENT) 18-103 MCG/ACT inhaler Inhale 1 puff into the lungs every 4 (four) hours as needed for wheezing or shortness of breath.    Historical Provider, MD  esomeprazole (NEXIUM) 40 MG capsule Take 40 mg by mouth daily at 12 noon.    Historical Provider, MD  fluticasone (FLOVENT HFA) 110 MCG/ACT inhaler Inhale into the lungs 2 (two) times daily.    Historical Provider, MD  ibuprofen  (ADVIL,MOTRIN) 600 MG tablet Take 1 tablet (600 mg total) by mouth every 6 (six) hours as needed. 12/21/15   Jami L Hagler, PA-C  ketorolac (TORADOL) 10 MG tablet Take 1 tablet (10 mg total) by mouth every 8 (eight) hours. 01/15/16   Jenise V Bacon Menshew, PA-C  meclizine (ANTIVERT) 25 MG tablet Take 1 tablet (25 mg total) by mouth 3 (three) times daily as needed for dizziness. 04/23/16   Myrna Blazeravid Matthew Schaevitz, MD  orphenadrine (NORFLEX) 100 MG tablet Take 1 tablet (100 mg total) by mouth 2 (two) times daily as needed for muscle spasms. 01/15/16   Jenise V Bacon Menshew, PA-C  predniSONE (DELTASONE) 20 MG tablet Take 2 tablets (40 mg total) by mouth daily. Take 40 mg by mouth daily for 3 days, then 20mg  by mouth daily for 3 days, then 10mg  daily for 3 days 01/16/14   Mellody DrownLauren Parker, PA-C  traMADol (ULTRAM) 50 MG tablet Take 1 tablet (50 mg total) by mouth every 6 (six) hours as needed. 05/27/16 05/27/17  Minna AntisKevin Paduchowski, MD    Allergies Patient has no known allergies.  Family History  Problem Relation Age of Onset  . Adopted: Yes  . Asthma Mother   . Depression Mother   . Diabetes Brother     Social History Social History  Substance Use Topics  . Smoking status: Current Every Day Smoker  . Smokeless tobacco: Never Used  . Alcohol use Yes    Review of Systems  Constitutional: Positive for myalgias. No fever/chills. Eyes: No visual changes. ENT: No sore throat. Cardiovascular: Denies  chest pain. Respiratory: Positive for nonproductive cough, wheezing and shortness of breath. Gastrointestinal: No abdominal pain.  No nausea, no vomiting.  No diarrhea.  No constipation. Genitourinary: Negative for dysuria. Musculoskeletal: Negative for back pain. Skin: Negative for rash. Neurological: Negative for headaches, focal weakness or numbness.  10-point ROS otherwise negative.  ____________________________________________   PHYSICAL EXAM:  VITAL SIGNS: ED Triage Vitals  Enc Vitals  Group     BP 09/10/16 2108 121/77     Pulse Rate 09/10/16 2108 (!) 105     Resp 09/10/16 2108 (!) 28     Temp 09/10/16 2108 99 F (37.2 C)     Temp Source 09/10/16 2108 Oral     SpO2 09/10/16 2108 99 %     Weight 09/10/16 2108 267 lb (121.1 kg)     Height 09/10/16 2108 5\' 3"  (1.6 m)     Head Circumference --      Peak Flow --      Pain Score 09/10/16 2109 7     Pain Loc --      Pain Edu? --      Excl. in GC? --     Constitutional: Alert and oriented. Well appearing and in mild acute distress. Eyes: Conjunctivae are normal. PERRL. EOMI. Head: Atraumatic. Ears: Bilateral TM dullness. Nose: No congestion/rhinnorhea. Mouth/Throat: Mucous membranes are moist.  Oropharynx mildly erythematous without tonsillar swelling, exudates or peritonsillar abscess. No hoarse or muffled voice. There is no drooling. Neck: No stridor.   Cardiovascular: Normal rate, regular rhythm. Grossly normal heart sounds.  Good peripheral circulation. Respiratory: Increased respiratory effort.  No retractions. Lungs with diffuse wheezing. Gastrointestinal: Obese. Soft and nontender. No distention. No abdominal bruits. No CVA tenderness. Musculoskeletal: No lower extremity tenderness nor edema.  No joint effusions. Neurologic:  Normal speech and language. No gross focal neurologic deficits are appreciated.  Skin:  Skin is warm, dry and intact. No rash noted. No petechiae. Psychiatric: Mood and affect are normal. Speech and behavior are normal.  ____________________________________________   LABS (all labs ordered are listed, but only abnormal results are displayed)  Labs Reviewed  BASIC METABOLIC PANEL - Abnormal; Notable for the following:       Result Value   Glucose, Bld 117 (*)    All other components within normal limits  CBC - Abnormal; Notable for the following:    Hemoglobin 9.1 (*)    HCT 28.5 (*)    MCV 63.9 (*)    MCH 20.4 (*)    RDW 18.9 (*)    All other components within normal limits    URINALYSIS, COMPLETE (UACMP) WITH MICROSCOPIC - Abnormal; Notable for the following:    Color, Urine YELLOW (*)    APPearance CLEAR (*)    Leukocytes, UA MODERATE (*)    Bacteria, UA RARE (*)    Squamous Epithelial / LPF 0-5 (*)    All other components within normal limits  TROPONIN I  POCT PREGNANCY, URINE  POC URINE PREG, ED   ____________________________________________  EKG  ED ECG REPORT I, SUNG,JADE J, the attending physician, personally viewed and interpreted this ECG.   Date: 09/11/2016  EKG Time: 2108  Rate: 98  Rhythm: normal EKG, normal sinus rhythm  Axis: Normal  Intervals:none  ST&T Change: Nonspecific  ____________________________________________  RADIOLOGY  Chest 2 view (viewed by me, interpreted per Dr. Harrie Jeans): No active cardiopulmonary disease. ____________________________________________   PROCEDURES  Procedure(s) performed: None  Procedures  Critical Care performed: No  ____________________________________________   INITIAL  IMPRESSION / ASSESSMENT AND PLAN / ED COURSE  Pertinent labs & imaging results that were available during my care of the patient were reviewed by me and considered in my medical decision making (see chart for details).  35 year old female with a history of asthma who presents with a 3 day history of URI-type symptoms. Received albuterol nebs belies her while awaiting treatment room. Persist with diffuse wheezing. Will initiate IV fluid resuscitation, IV Solu-Medrol, DuoNeb and reassess. Laboratory and imaging studies remarkable for baseline anemia; no pneumonia on chest x-ray.  Clinical Course as of Sep 11 218  Sat Sep 11, 2016  0216 Remains diffusely wheezy after her third total nebulizer treatment. Tachypneic on mild exertion. Room air saturations 98%. Will discuss with hospitalist to evaluate patient in the emergency department for admission.  [JS]    Clinical Course User Index [JS] Irean HongJade J Sung, MD      ____________________________________________   FINAL CLINICAL IMPRESSION(S) / ED DIAGNOSES  Final diagnoses:  Moderate asthma with exacerbation, unspecified whether persistent  Upper respiratory tract infection, unspecified type      NEW MEDICATIONS STARTED DURING THIS VISIT:  New Prescriptions   No medications on file     Note:  This document was prepared using Dragon voice recognition software and may include unintentional dictation errors.    Irean HongJade J Sung, MD 09/11/16 [phone]

## 2016-09-11 NOTE — Progress Notes (Signed)
Still with wheezing, continue breathing treatments, steroids - plan dc tomorrow

## 2016-09-11 NOTE — ED Notes (Signed)
Transporting patient to room 111-1C

## 2016-09-12 LAB — HEMOGLOBIN A1C
Hgb A1c MFr Bld: 6 % — ABNORMAL HIGH (ref 4.8–5.6)
Mean Plasma Glucose: 126 mg/dL

## 2016-09-12 MED ORDER — NICOTINE 21 MG/24HR TD PT24: 21.0000 mg | MEDICATED_PATCH | Freq: Every day | TRANSDERMAL | 0 refills | Status: DC

## 2016-09-12 MED ORDER — IPRATROPIUM-ALBUTEROL 18-103 MCG/ACT IN AERO: 1.0000 | INHALATION_SPRAY | RESPIRATORY_TRACT | 0 refills | Status: DC | PRN

## 2016-09-12 MED ORDER — ALBUTEROL SULFATE HFA 108 (90 BASE) MCG/ACT IN AERS: 2.0000 | INHALATION_SPRAY | Freq: Four times a day (QID) | RESPIRATORY_TRACT | 0 refills | Status: DC | PRN

## 2016-09-12 MED ORDER — IPRATROPIUM-ALBUTEROL 0.5-2.5 (3) MG/3ML IN SOLN
3.0000 mL | Freq: Four times a day (QID) | RESPIRATORY_TRACT | Status: DC
  Administered 2016-09-12: 15:00:00 3 mL via RESPIRATORY_TRACT
  Filled 2016-09-12: qty 3

## 2016-09-12 MED ORDER — MOMETASONE FURO-FORMOTEROL FUM 200-5 MCG/ACT IN AERO: 2.0000 | INHALATION_SPRAY | Freq: Two times a day (BID) | RESPIRATORY_TRACT | 0 refills | Status: DC

## 2016-09-12 MED ORDER — ALBUTEROL SULFATE (2.5 MG/3ML) 0.083% IN NEBU
2.5000 mg | INHALATION_SOLUTION | Freq: Four times a day (QID) | RESPIRATORY_TRACT | 12 refills | Status: DC | PRN
Start: 2016-09-12 — End: 2017-10-29

## 2016-09-12 MED ORDER — PREDNISONE 10 MG PO TABS: ORAL_TABLET | ORAL | 0 refills | Status: DC

## 2016-09-12 MED ORDER — TRAMADOL HCL 50 MG PO TABS: 50.0000 mg | ORAL_TABLET | Freq: Four times a day (QID) | ORAL | 0 refills | Status: DC | PRN

## 2016-09-12 MED ORDER — IPRATROPIUM-ALBUTEROL 0.5-2.5 (3) MG/3ML IN SOLN
3.0000 mL | RESPIRATORY_TRACT | Status: DC | PRN
  Administered 2016-09-12: 3 mL via RESPIRATORY_TRACT
  Filled 2016-09-12: qty 3

## 2016-09-12 MED ORDER — GUAIFENESIN-CODEINE 100-10 MG/5ML PO SOLN: 10.0000 mL | ORAL | 0 refills | Status: DC | PRN

## 2016-09-12 NOTE — Progress Notes (Signed)
MD order received to discharge pt home today; verbally reviewed AVS with pt, no questions voiced at this time; pt's discharge pending arrival of her ride home 

## 2016-09-12 NOTE — Discharge Summary (Signed)
[employer] [facility] [place] [facility]   PATIENT NAME: Amy Pennington    MR#:  [id number]  DATE OF BIRTH:  Nov 03, 1980  DATE OF ADMISSION:  09/11/2016 ADMITTING PHYSICIAN: Arnaldo NatalMichael S Diamond, MD  DATE OF DISCHARGE: 09/12/16  PRIMARY CARE PHYSICIAN: No PCP Per Patient    ADMISSION DIAGNOSIS:  Upper respiratory tract infection, unspecified type [J06.9] Moderate asthma with exacerbation, unspecified whether persistent [J45.901]  DISCHARGE DIAGNOSIS:  Active Problems:   Asthma exacerbation   SECONDARY DIAGNOSIS:   Past Medical History:  Diagnosis Date  . Asthma   . Bipolar 1 disorder (HCC)   . Depression   . GERD (gastroesophageal reflux disease)   . Migraines   . Schizo affective schizophrenia [facility](HCC)     HOSPITAL COURSE:  Amy RumpfQuatius Standage  is a 35 y.o. female admitted 09/11/2016 with chief complaint Shortness of Breath . Please see H&P performed by Arnaldo NatalMichael S Diamond, MD for further information. Patient presented with the above symptoms, stated ran out of inhalers a few days prior to arrival. She received breathing treatments as well as steroids with improvement.  DISCHARGE CONDITIONS:   stable  CONSULTS OBTAINED:    DRUG ALLERGIES:  No Known Allergies  DISCHARGE MEDICATIONS:   Current Discharge Medication List    START taking these medications   Details  guaiFENesin-codeine 100-10 MG/5ML syrup Take 10 mLs by mouth every 4 (four) hours as needed for cough. Qty: 120 mL, Refills: 0    mometasone-formoterol (DULERA) 200-5 MCG/ACT AERO Inhale 2 puffs into the lungs 2 (two) times daily. Qty: 1 Inhaler, Refills: 0    predniSONE (DELTASONE) 10 MG tablet 40mg  x1 day, 20mg  x2 day, 10mg  x2 day then stop Qty: 10 tablet, Refills: 0      CONTINUE these medications which have CHANGED   Details  albuterol-ipratropium (COMBIVENT) 18-103 MCG/ACT inhaler Inhale 1 puff into the lungs every 4 (four) hours as needed for wheezing or shortness of breath. Qty: 1  Inhaler, Refills: 0    traMADol (ULTRAM) 50 MG tablet Take 1 tablet (50 mg total) by mouth every 6 (six) hours as needed for moderate pain. Qty: 30 tablet, Refills: 0      CONTINUE these medications which have NOT CHANGED   Details  albuterol (PROVENTIL HFA;VENTOLIN HFA) 108 (90 Base) MCG/ACT inhaler Inhale 2 puffs into the lungs every 6 (six) hours as needed for wheezing or shortness of breath.      STOP taking these medications     esomeprazole (NEXIUM) 40 MG capsule      meclizine (ANTIVERT) 25 MG tablet      orphenadrine (NORFLEX) 100 MG tablet          DISCHARGE INSTRUCTIONS:    DIET:  Regular diet  DISCHARGE CONDITION:  Stable  ACTIVITY:  Activity as tolerated  OXYGEN:  Home Oxygen: No.   Oxygen Delivery: room air  DISCHARGE LOCATION:  home   If you experience worsening of your admission symptoms, develop shortness of breath, life threatening emergency, suicidal or homicidal thoughts you must seek medical attention immediately by calling 911 or calling your MD immediately  if symptoms less severe.  You Must read complete instructions/literature along with all the possible adverse reactions/side effects for all the Medicines you take and that have been prescribed to you. Take any new Medicines after you have completely understood and accpet all the possible adverse reactions/side effects.   Please note  You were cared for by a hospitalist during your hospital stay. If you  have any questions about your discharge medications or the care you received while you were in the hospital after you are discharged, you can call the unit and asked to speak with the hospitalist on call if the hospitalist that took care of you is not available. Once you are discharged, your primary care physician will handle any further medical issues. Please note that NO REFILLS for any discharge medications will be authorized once you are discharged, as it is imperative that you return to  your primary care physician (or establish a relationship with a primary care physician if you do not have one) for your aftercare needs so that they can reassess your need for medications and monitor your lab values.    On the day of Discharge:   VITAL SIGNS:  Blood pressure 139/80, pulse (!) 102, temperature 98 F (36.7 C), temperature source Oral, resp. rate 17, height 5\' 3"  (1.6 m), weight 125.4 kg (276 lb 6.4 oz), last menstrual period 08/21/2016, SpO2 98 %.  I/O:   Intake/Output Summary (Last 24 hours) at 09/12/16 0939 Last data filed at 09/11/16 2200  Gross per 24 hour  Intake              480 ml  Output              250 ml  Net              230 ml    PHYSICAL EXAMINATION:  GENERAL:  35 y.o.-year-old patient lying in the bed with no acute distress.  EYES: Pupils equal, round, reactive to light and accommodation. No scleral icterus. Extraocular muscles intact.  HEENT: Head atraumatic, normocephalic. Oropharynx and nasopharynx clear.  NECK:  Supple, no jugular venous distention. No thyroid enlargement, no tenderness.  LUNGS: Normal breath sounds bilaterally, no wheezing, rales,rhonchi or crepitation. No use of accessory muscles of respiration.  CARDIOVASCULAR: S1, S2 normal. No murmurs, rubs, or gallops.  ABDOMEN: Soft, non-tender, non-distended. Bowel sounds present. No organomegaly or mass.  EXTREMITIES: No pedal edema, cyanosis, or clubbing.  NEUROLOGIC: Cranial nerves II through XII are intact. Muscle strength 5/5 in all extremities. Sensation intact. Gait not checked.  PSYCHIATRIC: The patient is alert and oriented x 3.  SKIN: No obvious rash, lesion, or ulcer.   DATA REVIEW:   CBC  Recent Labs Lab 09/10/16 2112  WBC 9.2  HGB 9.1*  HCT 28.5*  PLT 428    Chemistries   Recent Labs Lab 09/10/16 2112  NA 135  K 3.5  CL 105  CO2 22  GLUCOSE 117*  BUN 6  CREATININE 0.72  CALCIUM 9.3    Cardiac Enzymes  Recent Labs Lab 09/10/16 2112  TROPONINI  <0.03    Microbiology Results  Results for orders placed or performed in visit on 06/02/13  Culture, expectorated sputum-assessment     Status: None   Collection Time: 06/03/13 12:23 PM  Result Value Ref Range Status   Micro Text Report   Final       ORGANISM 1                LIGHT GROWTH GROUP F STREPTOCOCCUS   COMMENT                   -   GRAM STAIN                FAIR SPECIMEN-70-80% WBC   GRAM STAIN  FEW WHITE BLOOD CELLS   GRAM STAIN                MODERATE GRAM POSITIVE COCCI IN PAIRS   GRAM STAIN                FEW GRAM POSITIVE RODS RARE GRAM VARIABLE RODS   ANTIBIOTIC                                                        RADIOLOGY:  Dg Chest 2 View  Result Date: 09/10/2016 CLINICAL DATA:  35 y/o  F; shortness of breath. EXAM: CHEST  2 VIEW COMPARISON:  05/27/2016 chest radiograph FINDINGS: The heart size and mediastinal contours are within normal limits and stable. Both lungs are clear. The visualized skeletal structures are unremarkable. IMPRESSION: No active cardiopulmonary disease. Electronically Signed   By: Mitzi Hansen M.D.   On: 09/10/2016 22:11     Management plans discussed with the patient, family and they are in agreement.  CODE STATUS:     Code Status Orders        Start     Ordered   09/11/16 0406  Full code  Continuous     09/11/16 0406    Code Status History    Date Active Date Inactive Code Status Order ID Comments User Context   This patient has a current code status but no historical code status.      TOTAL TIME TAKING CARE OF THIS PATIENT: 33 minutes.    Avory Rahimi,  Mardi Mainland.D on 09/12/2016 at 9:39 AM  Between 7am to 6pm - Pager - [phone]  After 6pm go to [email or web address] - Scientist, research (life sciences) [facility]  Office  [phone]  CC: Primary care physician; No PCP Per Patient

## 2016-09-12 NOTE — Progress Notes (Signed)
Pt's ride present for pt discharge; pt discharged via wheelchair by auxillary to the visitor's entrance

## 2016-10-15 ENCOUNTER — Ambulatory Visit: Payer: Medicaid Other | Admitting: Pharmacy Technician

## 2016-10-15 NOTE — Progress Notes (Signed)
Patient scheduled for eligibility appointment at [facility].  Patient did not show for the appointment on 10/15/16 at 10:30a.m.  Patient did not reschedule eligibility appointment.  Amy DacostaBetty J. Amy Pennington Care Manager [facility]

## 2017-03-22 IMAGING — CR DG CHEST 2V
1 series · 2 of 2 positions shown · non-contrast
Comparison: 05/27/2016 chest radiograph

CLINICAL DATA: 34 y/o  F; shortness of breath.

EXAM:
CHEST  2 VIEW

[Series 1: dg chest 2 view · 0.14mm/px · 2 of 2 slices shown]
[im 1/2]
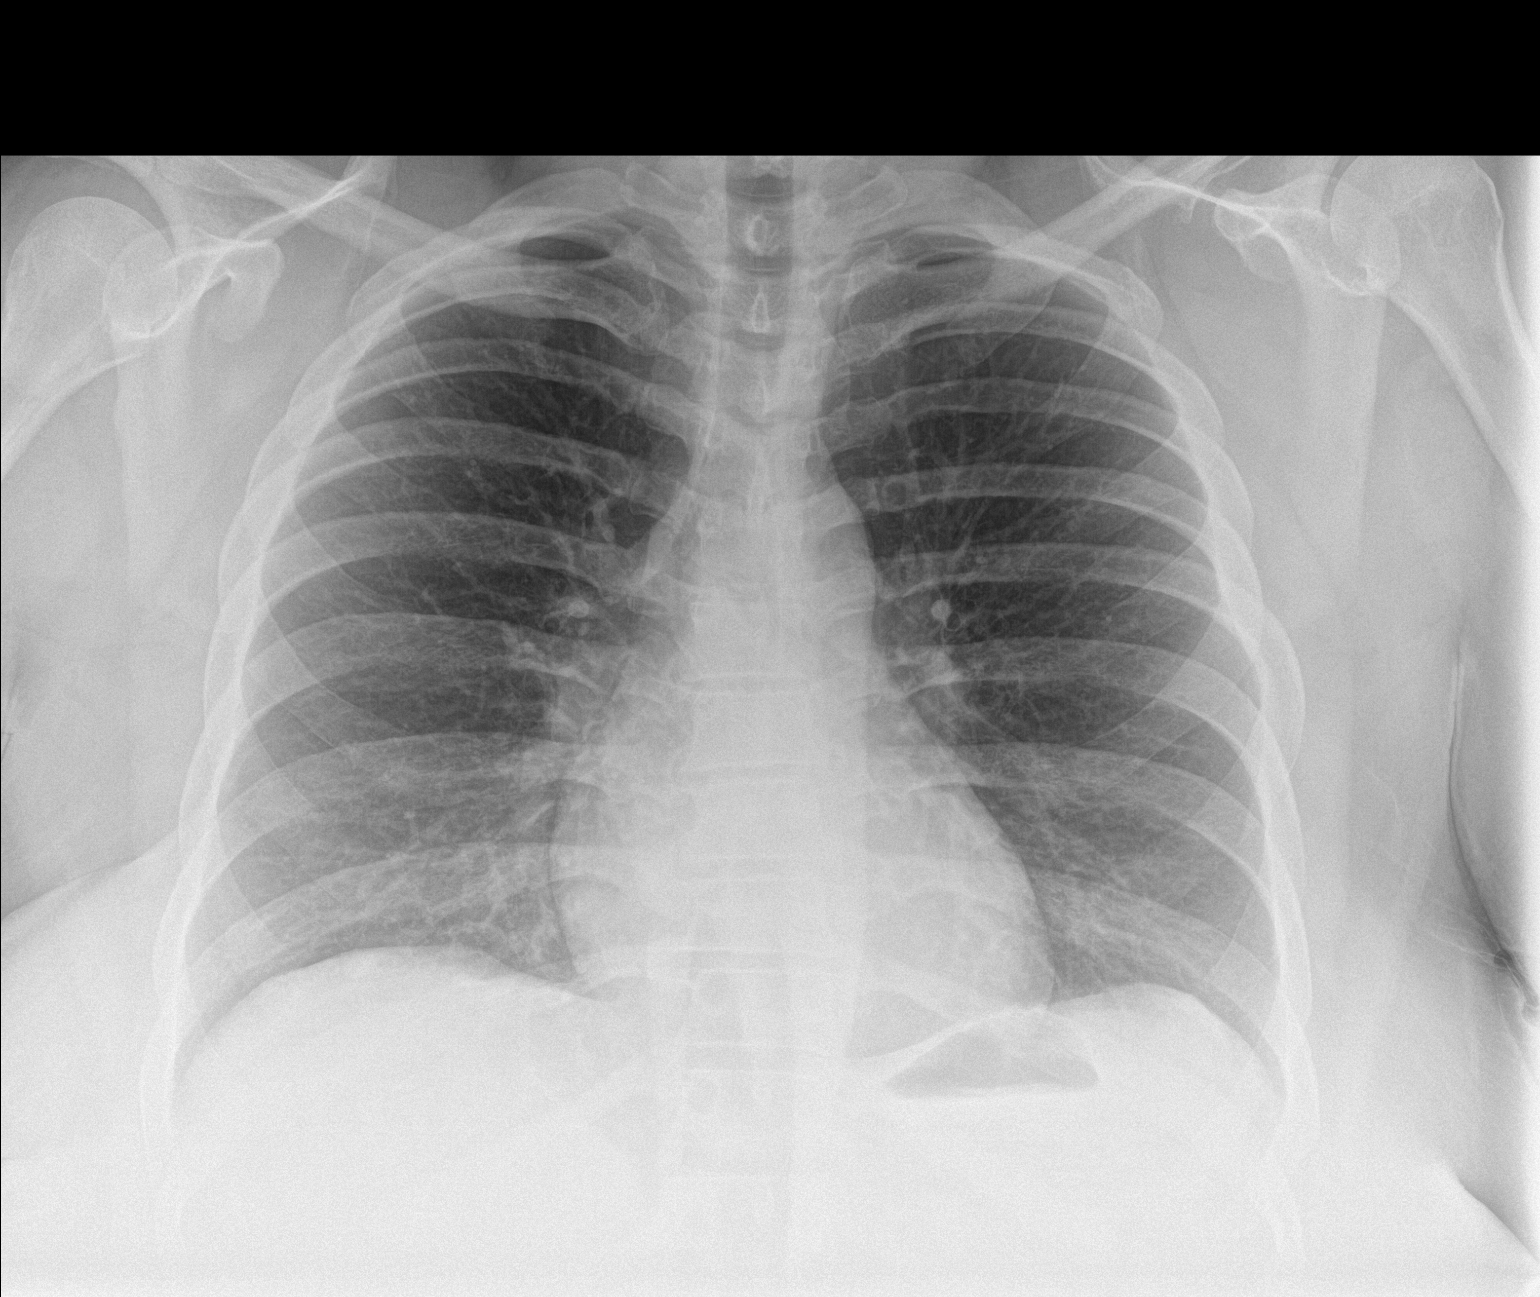
[im 2/2]
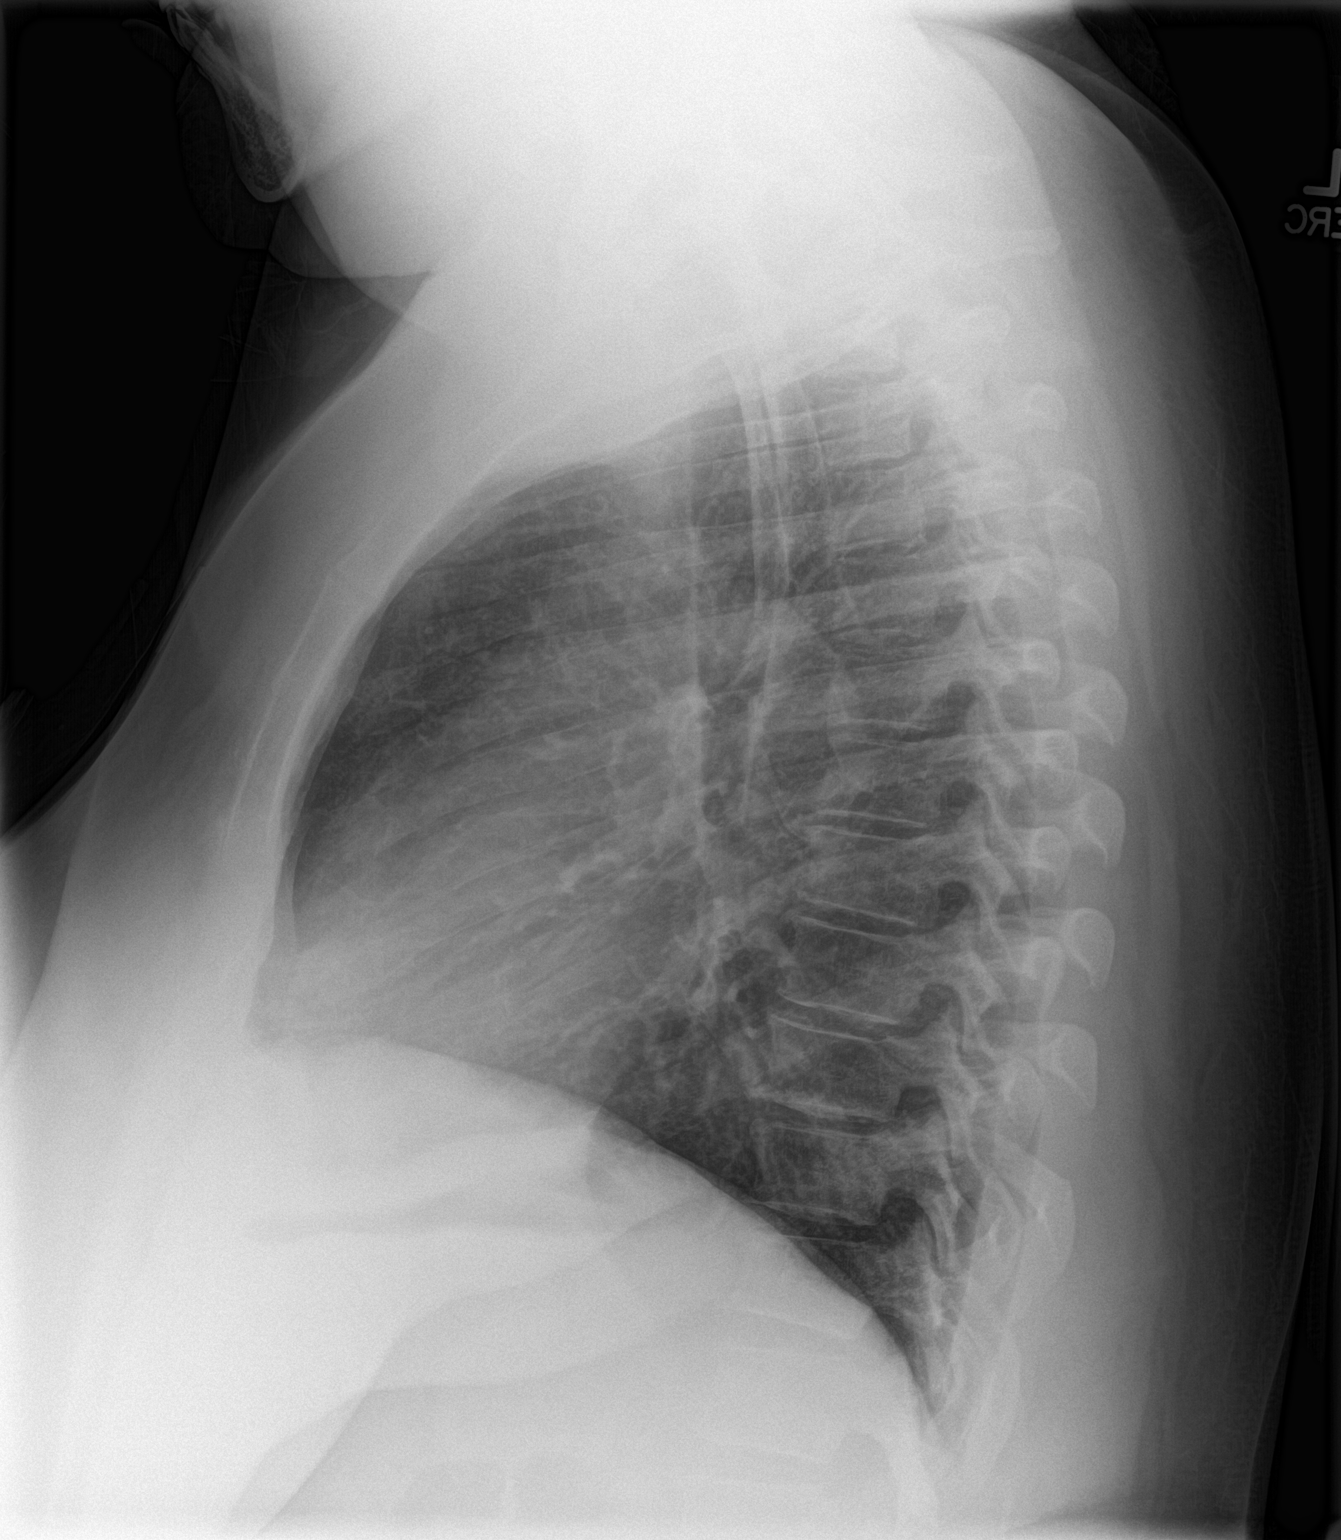

[2 of 2 positions shown; findings below may reference images not displayed]

FINDINGS: The heart size and mediastinal contours are within normal limits and
stable. Both lungs are clear. The visualized skeletal structures are
unremarkable.
IMPRESSION: No active cardiopulmonary disease.

By: Joe Farrugia Razilef M.D.

## 2017-10-04 ENCOUNTER — Other Ambulatory Visit: Payer: Self-pay

## 2017-10-04 ENCOUNTER — Emergency Department
Admission: EM | Admit: 2017-10-04 | Discharge: 2017-10-04 | Disposition: A | Discharge status: Home / Self Care | Payer: Self-pay | Attending: Emergency Medicine | Admitting: Emergency Medicine

## 2017-10-04 ENCOUNTER — Encounter: Payer: Self-pay | Admitting: Emergency Medicine

## 2017-10-04 ENCOUNTER — Emergency Department: Payer: Self-pay

## 2017-10-04 DIAGNOSIS — L239 Allergic contact dermatitis, unspecified cause: Secondary | ICD-10-CM | POA: Insufficient documentation

## 2017-10-04 DIAGNOSIS — Z79899 Other long term (current) drug therapy: Secondary | ICD-10-CM | POA: Insufficient documentation

## 2017-10-04 DIAGNOSIS — F17210 Nicotine dependence, cigarettes, uncomplicated: Secondary | ICD-10-CM | POA: Insufficient documentation

## 2017-10-04 DIAGNOSIS — J4521 Mild intermittent asthma with (acute) exacerbation: Secondary | ICD-10-CM | POA: Insufficient documentation

## 2017-10-04 MED ORDER — METHYLPREDNISOLONE 4 MG PO TBPK: ORAL_TABLET | ORAL | 0 refills | Status: DC

## 2017-10-04 MED ORDER — IPRATROPIUM-ALBUTEROL 0.5-2.5 (3) MG/3ML IN SOLN
3.0000 mL | Freq: Once | RESPIRATORY_TRACT | Status: AC
  Administered 2017-10-04: 3 mL via RESPIRATORY_TRACT
  Filled 2017-10-04: qty 3

## 2017-10-04 MED ORDER — ALBUTEROL SULFATE HFA 108 (90 BASE) MCG/ACT IN AERS: 2.0000 | INHALATION_SPRAY | Freq: Four times a day (QID) | RESPIRATORY_TRACT | 2 refills | Status: DC | PRN

## 2017-10-04 MED ORDER — DEXAMETHASONE SODIUM PHOSPHATE 10 MG/ML IJ SOLN
10.0000 mg | Freq: Once | INTRAMUSCULAR | Status: AC
  Administered 2017-10-04: 10 mg via INTRAMUSCULAR
  Filled 2017-10-04: qty 1

## 2017-10-04 MED ORDER — HYDROCORTISONE VALERATE 0.2 % EX OINT: TOPICAL_OINTMENT | CUTANEOUS | 1 refills | Status: DC

## 2017-10-04 NOTE — ED Notes (Signed)
Clear BS, non-productive cough, NAD

## 2017-10-04 NOTE — ED Provider Notes (Signed)
[facility] Emergency Department Provider Note   ____________________________________________   First MD Initiated Contact with Patient 10/04/17 1143     (approximate)  I have reviewed the triage vital signs and the nursing notes.   HISTORY  Chief Complaint Shortness of Breath    HPI Amy Pennington is a 37 y.o. female patient complaining of shortness of breath for 3 days. Patient complaining of worsened this morning. Patient states she is out of her inhaler. Patient also concern about a rash around her ears. Patient denies new laundry or personal hygiene products.  Patient denies chest   Past Medical History:  Diagnosis Date  . Asthma   . Bipolar 1 disorder (HCC)   . Depression   . GERD (gastroesophageal reflux disease)   . Migraines   . Schizo affective schizophrenia [facility](HCC)     Patient Active Problem List   Diagnosis Date Noted  . Asthma exacerbation 09/11/2016  . Breast abscess 03/19/2016  . Asthma 09/08/2013    Past Surgical History:  Procedure Laterality Date  . none      Prior to Admission medications   Medication Sig Start Date End Date Taking? Authorizing Provider  albuterol (PROVENTIL HFA;VENTOLIN HFA) 108 (90 Base) MCG/ACT inhaler Inhale 2 puffs into the lungs every 6 (six) hours as needed for wheezing or shortness of breath. 09/12/16   Hower, Cletis Athensavid K, MD  albuterol (PROVENTIL HFA;VENTOLIN HFA) 108 (90 Base) MCG/ACT inhaler Inhale 2 puffs into the lungs every 6 (six) hours as needed for wheezing or shortness of breath. 10/04/17   Joni ReiningSmith, Ronald K, PA-C  albuterol (PROVENTIL) (2.5 MG/3ML) 0.083% nebulizer solution Take 3 mLs (2.5 mg total) by nebulization every 6 (six) hours as needed for wheezing or shortness of breath. 09/12/16   Hower, Cletis Athensavid K, MD  albuterol-ipratropium (COMBIVENT) 18-103 MCG/ACT inhaler Inhale 1 puff into the lungs every 4 (four) hours as needed for wheezing or shortness of breath. 09/12/16   Hower, Cletis Athensavid K, MD    guaiFENesin-codeine 100-10 MG/5ML syrup Take 10 mLs by mouth every 4 (four) hours as needed for cough. 09/12/16   Hower, Cletis Athensavid K, MD  hydrocortisone valerate ointment (WESTCORT) 0.2 % Apply to affected area daily 10/04/17 10/04/18  Joni ReiningSmith, Ronald K, PA-C  methylPREDNISolone (MEDROL DOSEPAK) 4 MG TBPK tablet Take Tapered dose as directed 10/04/17   Joni ReiningSmith, Ronald K, PA-C  mometasone-formoterol (DULERA) 200-5 MCG/ACT AERO Inhale 2 puffs into the lungs 2 (two) times daily. 09/12/16   Hower, Cletis Athensavid K, MD  nicotine (NICODERM CQ - DOSED IN MG/24 HOURS) 21 mg/24hr patch Place 1 patch (21 mg total) onto the skin daily. 09/13/16   Hower, Cletis Athensavid K, MD  predniSONE (DELTASONE) 10 MG tablet 40mg  x1 day, 20mg  x2 day, 10mg  x2 day then stop 09/12/16   Hower, Cletis Athensavid K, MD  traMADol (ULTRAM) 50 MG tablet Take 1 tablet (50 mg total) by mouth every 6 (six) hours as needed for moderate pain. 09/12/16   Hower, Cletis Athensavid K, MD    Allergies Patient has no known allergies.  Family History  Adopted: Yes  Problem Relation Age of Onset  . Asthma Mother   . Depression Mother   . Diabetes Brother     Social History Social History   Tobacco Use  . Smoking status: Current Every Day Smoker    Packs/day: 1.00    Years: 15.00    Pack years: 15.00    Types: Cigarettes  . Smokeless tobacco: Never Used  . Tobacco comment: pt states  that she is thinking about alternatives  Substance Use Topics  . Alcohol use: Yes    Alcohol/week: 0.6 oz    Types: 1 Cans of beer per week  . Drug use: Yes    Frequency: 2.0 times per week    Types: Cocaine, Marijuana    Comment: Cocaine and Marijuana once or twice a week    Review of Systems Constitutional: No fever/chills Eyes: No visual changes. ENT: No sore throat. Cardiovascular: Denies chest pain. Respiratory: Denies shortness of breath. Gastrointestinal: No abdominal pain.  No nausea, no vomiting.  No diarrhea.  No constipation. Genitourinary: Negative for dysuria. Musculoskeletal:  Negative for back pain. Skin: Negative for rash. Neurological: Negative for headaches, focal weakness or numbness.  ____________________________________________   PHYSICAL EXAM:  VITAL SIGNS: ED Triage Vitals  Enc Vitals Group     BP 10/04/17 1137 130/77     Pulse Rate 10/04/17 1137 85     Resp 10/04/17 1137 18     Temp 10/04/17 1137 98.4 F (36.9 C)     Temp Source 10/04/17 1137 Oral     SpO2 10/04/17 1137 97 %     Weight 10/04/17 1138 276 lb (125.2 kg)     Height --      Head Circumference --      Peak Flow --      Pain Score 10/04/17 1137 0     Pain Loc --      Pain Edu? --      Excl. in GC? --    Constitutional: Alert and oriented. Well appearing and in no acute distress. Nose: No congestion/rhinnorhea. Neck: No stridor. Hematological/Lymphatic/Immunilogical: No cervical lymphadenopathy. Cardiovascular: Normal rate, regular rhythm. Grossly normal heart sounds.  Good peripheral circulation. Respiratory: Normal respiratory effort.  No retractions. Lungs mild wheezing Neurologic:  Normal speech and language. No gross focal neurologic deficits are appreciated. No gait instability. Skin:  Skin is warm, dry and intact. No rash noted. Psychiatric: Mood and affect are normal. Speech and behavior are normal.  ____________________________________________   LABS (all labs ordered are listed, but only abnormal results are displayed)  Labs Reviewed - No data to display ____________________________________________  EKG   ____________________________________________  RADIOLOGY  Dg Chest 2 View  Result Date: 10/04/2017 CLINICAL DATA:  Shortness of Breath EXAM: CHEST  2 VIEW COMPARISON:  September 10, 2016 FINDINGS: Lungs are clear. Heart size and pulmonary vascularity are normal. No adenopathy. No evident bone lesions. IMPRESSION: No edema or consolidation. Electronically Signed   By: Bretta Bang III M.D.   On: 10/04/2017 12:16     ____________________________________________   PROCEDURES  Procedure(s) performed: None  Procedures  Critical Care performed: No  ____________________________________________   INITIAL IMPRESSION / ASSESSMENT AND PLAN / ED COURSE  As part of my medical decision making, I reviewed the following data within the electronic MEDICAL RECORD NUMBER    Patient presents with mild wheezing secondary to asthmatic condition. Patient also has a rash bilateral ear.  Patient state she needs a new inhaler. Discussed x-ray findings the patient were unremarkable. Patient given a nebulized treatment prior to departure. Patient given a prescription for Medrol Dosepak, Ventolin, and Westcort. Patient advised [facility].      ____________________________________________   FINAL CLINICAL IMPRESSION(S) / ED DIAGNOSES  Final diagnoses:  Mild intermittent asthma with exacerbation  Allergic contact dermatitis, unspecified trigger     ED Discharge Orders        Ordered  albuterol (PROVENTIL HFA;VENTOLIN HFA) 108 (90 Base) MCG/ACT inhaler  Every 6 hours PRN     10/04/17 1224    methylPREDNISolone (MEDROL DOSEPAK) 4 MG TBPK tablet     10/04/17 1224    hydrocortisone valerate ointment (WESTCORT) 0.2 %     10/04/17 1224       Note:  This document was prepared using Dragon voice recognition software and may include unintentional dictation errors.    Joni Reining, PA-C 10/04/17 1229    Sharman Cheek, MD 10/04/17 1534

## 2017-10-04 NOTE — ED Notes (Signed)
FIRST NURSE NOTE: Pt ambulatory by POV to ER c/o SOB and bilateral ear burning X 2 days. NAD.

## 2017-10-04 NOTE — ED Triage Notes (Signed)
Pt to ed with c/o sob x 3 days, worsening.  Pt states she does not have her inhaler as she [place] it [place] another state.  Pt able to speak in full sentences, states her ears were hot and "fire red" on Saturday, states afterwards her skin peeled off of her ears bilat.

## 2017-10-11 ENCOUNTER — Ambulatory Visit: Payer: Self-pay | Admitting: Pharmacy Technician

## 2017-10-13 NOTE — Progress Notes (Signed)
Patient did not attend eligibility appt.  Had provided all poi with the exception of signed cotract.  Pt to sign and return to [facility].  Pt approved to receive medication assistance at [facility] through 2019, as long as eligibility criteria continues to be met.  [facility]

## 2017-10-27 ENCOUNTER — Encounter: Payer: Self-pay | Admitting: Emergency Medicine

## 2017-10-27 ENCOUNTER — Inpatient Hospital Stay
Admission: EM | Admit: 2017-10-27 | Discharge: 2017-10-29 | DRG: 853 | Disposition: A | Discharge status: Home / Self Care | Payer: Self-pay | Attending: Internal Medicine | Admitting: Internal Medicine

## 2017-10-27 ENCOUNTER — Emergency Department: Payer: Self-pay

## 2017-10-27 ENCOUNTER — Other Ambulatory Visit: Payer: Self-pay

## 2017-10-27 DIAGNOSIS — J45909 Unspecified asthma, uncomplicated: Secondary | ICD-10-CM | POA: Diagnosis present

## 2017-10-27 DIAGNOSIS — F259 Schizoaffective disorder, unspecified: Secondary | ICD-10-CM | POA: Diagnosis present

## 2017-10-27 DIAGNOSIS — N390 Urinary tract infection, site not specified: Secondary | ICD-10-CM

## 2017-10-27 DIAGNOSIS — R6521 Severe sepsis with septic shock: Secondary | ICD-10-CM | POA: Diagnosis present

## 2017-10-27 DIAGNOSIS — Z79899 Other long term (current) drug therapy: Secondary | ICD-10-CM

## 2017-10-27 DIAGNOSIS — K219 Gastro-esophageal reflux disease without esophagitis: Secondary | ICD-10-CM | POA: Diagnosis present

## 2017-10-27 DIAGNOSIS — N201 Calculus of ureter: Secondary | ICD-10-CM

## 2017-10-27 DIAGNOSIS — F319 Bipolar disorder, unspecified: Secondary | ICD-10-CM | POA: Diagnosis present

## 2017-10-27 DIAGNOSIS — Z818 Family history of other mental and behavioral disorders: Secondary | ICD-10-CM

## 2017-10-27 DIAGNOSIS — A419 Sepsis, unspecified organism: Principal | ICD-10-CM | POA: Diagnosis present

## 2017-10-27 DIAGNOSIS — D62 Acute posthemorrhagic anemia: Secondary | ICD-10-CM | POA: Diagnosis not present

## 2017-10-27 DIAGNOSIS — G43909 Migraine, unspecified, not intractable, without status migrainosus: Secondary | ICD-10-CM | POA: Diagnosis present

## 2017-10-27 DIAGNOSIS — F17210 Nicotine dependence, cigarettes, uncomplicated: Secondary | ICD-10-CM | POA: Diagnosis present

## 2017-10-27 DIAGNOSIS — E876 Hypokalemia: Secondary | ICD-10-CM | POA: Diagnosis present

## 2017-10-27 DIAGNOSIS — F1490 Cocaine use, unspecified, uncomplicated: Secondary | ICD-10-CM | POA: Diagnosis present

## 2017-10-27 DIAGNOSIS — F1290 Cannabis use, unspecified, uncomplicated: Secondary | ICD-10-CM | POA: Diagnosis present

## 2017-10-27 DIAGNOSIS — Z825 Family history of asthma and other chronic lower respiratory diseases: Secondary | ICD-10-CM

## 2017-10-27 DIAGNOSIS — N136 Pyonephrosis: Secondary | ICD-10-CM | POA: Diagnosis present

## 2017-10-27 LAB — INFLUENZA PANEL BY PCR (TYPE A & B)
Influenza A By PCR: NEGATIVE
Influenza B By PCR: NEGATIVE

## 2017-10-27 MED ORDER — ACETAMINOPHEN 500 MG PO TABS
1000.0000 mg | ORAL_TABLET | Freq: Once | ORAL | Status: AC
  Administered 2017-10-27: 1000 mg via ORAL
  Filled 2017-10-27: qty 2

## 2017-10-27 MED ORDER — KETOROLAC TROMETHAMINE 30 MG/ML IJ SOLN
15.0000 mg | Freq: Once | INTRAMUSCULAR | Status: AC
  Administered 2017-10-28: 15 mg via INTRAVENOUS
  Filled 2017-10-27: qty 1

## 2017-10-27 MED ORDER — DIPHENHYDRAMINE HCL 50 MG/ML IJ SOLN
12.5000 mg | INTRAMUSCULAR | Status: AC
  Administered 2017-10-28: 12.5 mg via INTRAVENOUS
  Filled 2017-10-27: qty 1

## 2017-10-27 MED ORDER — METOCLOPRAMIDE HCL 5 MG/ML IJ SOLN
10.0000 mg | INTRAMUSCULAR | Status: AC
  Administered 2017-10-28: 10 mg via INTRAVENOUS
  Filled 2017-10-27: qty 2

## 2017-10-27 MED ORDER — SODIUM CHLORIDE 0.9 % IV BOLUS (SEPSIS)
1000.0000 mL | Freq: Once | INTRAVENOUS | Status: AC
  Administered 2017-10-27: 1000 mL via INTRAVENOUS

## 2017-10-27 MED ORDER — SODIUM CHLORIDE 0.9 % IV BOLUS (SEPSIS)
1000.0000 mL | INTRAVENOUS | Status: AC
  Administered 2017-10-28: 1000 mL via INTRAVENOUS

## 2017-10-27 NOTE — ED Notes (Signed)
Informed RN that patient has been roomed and is ready for evaluation.  Patient in NAD at this time and call bell placed within reach.   

## 2017-10-27 NOTE — ED Triage Notes (Addendum)
Patient ambulatory to triage with steady gait, without difficulty or distress noted, mask in place; pt reports having chills & fever with body aches x 3 days; also c/o frontal HA with hx of same

## 2017-10-27 NOTE — ED Notes (Signed)
Pt reports having a bad HA, abdominal pain and coughing for past two days. No vomiting or diarrhea. Pt Also states her entire right side is aching. Family is bedside. Dr. York CeriseForbach is at bedside. Pt has Hx of migraines.

## 2017-10-27 NOTE — ED Provider Notes (Signed)
[facility] Emergency Department Provider Note  ____________________________________________   First MD Initiated Contact with Patient 10/27/17 2301     (approximate)  I have reviewed the triage vital signs and the nursing notes.   HISTORY  Chief Complaint Migraine and Fever    HPI Amy Pennington is a 37 y.o. female with medical history as listed below who presents for evaluation of general malaise and fatigue, intermittent headache, generalized body aches, abdominal pain, nausea, decreased appetite, cough, and occasional shortness of breath.  All of the symptoms have been going on for about 3 days.  She states that the coughing and moving around make her abdominal pain worse.  She sometimes feels short of breath but does have a history of asthma.  She feels bad all over and states that her headache will go away briefly and sometimes feels similar to prior migraines but then it will come back and feel much worse.  She describes it as a frontal headache that is pounding particularly in both temples.  It is sensitive to light and it makes the headache worse.  She denies any neck pain or stiffness.  She has not had any confusion, difficulty ambulating, or numbness or weakness in any of her extremities.  She states that she just feels bad all over and wants to know what is wrong.  She has not had any dysuria or hematuria.  Her last menstrual period started about a week ago which was about a week early.  She describes all of her symptoms as severe.  Past Medical History:  Diagnosis Date  . Asthma   . Bipolar 1 disorder (HCC)   . Depression   . GERD (gastroesophageal reflux disease)   . Migraines   . Schizo affective schizophrenia [facility](HCC)     Patient Active Problem List   Diagnosis Date Noted  . Sepsis (HCC) 10/28/2017  . Asthma exacerbation 09/11/2016  . Breast abscess 03/19/2016  . Asthma 09/08/2013    Past Surgical History:  Procedure Laterality Date  .  none      Prior to Admission medications   Medication Sig Start Date End Date Taking? Authorizing Provider  albuterol (PROVENTIL HFA;VENTOLIN HFA) 108 (90 Base) MCG/ACT inhaler Inhale 2 puffs into the lungs every 6 (six) hours as needed for wheezing or shortness of breath. Patient not taking: Reported on 10/28/2017 09/12/16   Hower, Cletis Athensavid K, MD  albuterol (PROVENTIL HFA;VENTOLIN HFA) 108 (90 Base) MCG/ACT inhaler Inhale 2 puffs into the lungs every 6 (six) hours as needed for wheezing or shortness of breath. Patient not taking: Reported on 10/28/2017 10/04/17   Joni ReiningSmith, Ronald K, PA-C  albuterol (PROVENTIL) (2.5 MG/3ML) 0.083% nebulizer solution Take 3 mLs (2.5 mg total) by nebulization every 6 (six) hours as needed for wheezing or shortness of breath. Patient not taking: Reported on 10/28/2017 09/12/16   Hower, Cletis Athensavid K, MD  albuterol-ipratropium (COMBIVENT) 18-103 MCG/ACT inhaler Inhale 1 puff into the lungs every 4 (four) hours as needed for wheezing or shortness of breath. Patient not taking: Reported on 10/28/2017 09/12/16   Hower, Cletis Athensavid K, MD  guaiFENesin-codeine 100-10 MG/5ML syrup Take 10 mLs by mouth every 4 (four) hours as needed for cough. Patient not taking: Reported on 10/28/2017 09/12/16   Hower, Cletis Athensavid K, MD  hydrocortisone valerate ointment (WESTCORT) 0.2 % Apply to affected area daily Patient not taking: Reported on 10/28/2017 10/04/17 10/04/18  Joni ReiningSmith, Ronald K, PA-C  methylPREDNISolone (MEDROL DOSEPAK) 4 MG TBPK tablet Take Tapered dose as  directed Patient not taking: Reported on 10/28/2017 10/04/17   Joni Reining, PA-C  mometasone-formoterol [facility]) 200-5 MCG/ACT AERO Inhale 2 puffs into the lungs 2 (two) times daily. Patient not taking: Reported on 10/28/2017 09/12/16   Hower, Cletis Athens, MD  nicotine (NICODERM CQ - DOSED IN MG/24 HOURS) 21 mg/24hr patch Place 1 patch (21 mg total) onto the skin daily. Patient not taking: Reported on 10/28/2017 09/13/16   Hower, Cletis Athens, MD  predniSONE (DELTASONE) 10  MG tablet 40mg  x1 day, 20mg  x2 day, 10mg  x2 day then stop Patient not taking: Reported on 10/28/2017 09/12/16   Hower, Cletis Athens, MD  traMADol (ULTRAM) 50 MG tablet Take 1 tablet (50 mg total) by mouth every 6 (six) hours as needed for moderate pain. Patient not taking: Reported on 10/28/2017 09/12/16   Hower, Cletis Athens, MD    Allergies Patient has no known allergies.  Family History  Adopted: Yes  Problem Relation Age of Onset  . Asthma Mother   . Depression Mother   . Diabetes Brother     Social History Social History   Tobacco Use  . Smoking status: Current Every Day Smoker    Packs/day: 1.00    Years: 15.00    Pack years: 15.00    Types: Cigarettes  . Smokeless tobacco: Never Used  . Tobacco comment: pt states that she is thinking about alternatives  Substance Use Topics  . Alcohol use: Yes    Alcohol/week: 0.6 oz    Types: 1 Cans of beer per week  . Drug use: Yes    Frequency: 2.0 times per week    Types: Cocaine, Marijuana    Comment: Cocaine and Marijuana once or twice a week    Review of Systems Constitutional: Fever to 102 in triage Eyes: Photophobia but no visual changes ENT: No sore throat.  No Difficulty swallowing.  Cardiovascular: Denies chest pain. Respiratory: Shortness of breath with cough Gastrointestinal: Lower abdominal pain worse with coughing times 2-3 days.  Nausea, no vomiting.  No diarrhea. Genitourinary: Negative for dysuria.  Last menstrual period started last week which was early Musculoskeletal: Negative for neck pain and stiffness.  Negative for back pain.  Right flank pain.  Generalized muscle aches/myalgias Integumentary: Negative for rash. Neurological: Negative for headaches, focal weakness or numbness.  ____________________________________________   PHYSICAL EXAM:  VITAL SIGNS: ED Triage Vitals  Enc Vitals Group     BP 10/27/17 2121 116/63     Pulse Rate 10/27/17 2121 (!) 110     Resp 10/27/17 2121 19     Temp 10/27/17 2121 (!)  102.3 F (39.1 C)     Temp Source 10/27/17 2121 Oral     SpO2 10/27/17 2121 100 %     Weight 10/27/17 2123 121.1 kg (267 lb)     Height 10/27/17 2123 1.6 m (5\' 3" )     Head Circumference --      Peak Flow --      Pain Score 10/27/17 2121 8     Pain Loc --      Pain Edu? --      Excl. in GC? --     Constitutional: Alert and oriented.  Appears uncomfortable but is in no acute distress Eyes: Conjunctivae are normal. PERRL. EOMI. Head: Atraumatic. Nose: No congestion/rhinnorhea. Mouth/Throat: Mucous membranes are moist.  Oropharynx non-erythematous with no exudate or petechiae on the palate Neck: No stridor.  No meningeal signs; she is able to fully rotate her head side to  side and flex and extend without any limitation of range of motion and no pain/tenderness Cardiovascular: Mild tachycardia, regular rhythm. Good peripheral circulation. Grossly normal heart sounds. Respiratory: Normal respiratory effort.  No retractions. Lungs CTAB.  Occasional cough Gastrointestinal: Soft with diffuse tenderness throughout lower abdomen but no localized peritonitis. Genitourinary: deferred Musculoskeletal: No lower extremity tenderness nor edema. No gross deformities of extremities. Neurologic:  Normal speech and language. No gross focal neurologic deficits are appreciated.  Skin:  Skin is warm, dry and intact. No rash noted. Psychiatric: Mood and affect are normal. Speech and behavior are normal.  ____________________________________________   LABS (all labs ordered are listed, but only abnormal results are displayed)  Labs Reviewed  COMPREHENSIVE METABOLIC PANEL - Abnormal; Notable for the following components:      Result Value   Sodium 134 (*)    Potassium 3.2 (*)    Glucose, Bld 104 (*)    BUN 5 (*)    Calcium 8.6 (*)    Albumin 3.4 (*)    All other components within normal limits  CBC WITH DIFFERENTIAL/PLATELET - Abnormal; Notable for the following components:   Hemoglobin 10.9 (*)      HCT 33.9 (*)    MCV 74.7 (*)    MCH 23.9 (*)    RDW 18.7 (*)    All other components within normal limits  URINALYSIS, COMPLETE (UACMP) WITH MICROSCOPIC - Abnormal; Notable for the following components:   Color, Urine AMBER (*)    APPearance CLOUDY (*)    Hgb urine dipstick SMALL (*)    Leukocytes, UA MODERATE (*)    Bacteria, UA FEW (*)    Squamous Epithelial / LPF 0-5 (*)    All other components within normal limits  CULTURE, BLOOD (ROUTINE X 2)  CULTURE, BLOOD (ROUTINE X 2)  URINE CULTURE  URINE CULTURE  INFLUENZA PANEL BY PCR (TYPE A & B)  LACTIC ACID, PLASMA  LACTIC ACID, PLASMA  LIPASE, BLOOD  BRAIN NATRIURETIC PEPTIDE  TROPONIN I  PROCALCITONIN  PROTIME-INR  HCG, QUANTITATIVE, PREGNANCY  PREGNANCY, URINE  POC URINE PREG, ED   ____________________________________________  EKG  ED ECG REPORT I, Loleta Rose, the attending physician, personally viewed and interpreted this ECG.  Date: 10/27/2017 EKG Time: 23: 30 Rate: 77 Rhythm: normal sinus rhythm QRS Axis: normal Intervals: normal ST/T Wave abnormalities: normal Narrative Interpretation: no evidence of acute ischemia  ____________________________________________  RADIOLOGY I, Loleta Rose, personally viewed and evaluated these images (plain radiographs) as part of my medical decision making, as well as reviewing the written report by the radiologist.  ED MD interpretation: No focal opacities on chest x-ray and no evidence of pulmonary edema.  Official radiology report(s): Dg Chest Port 1 View  Result Date: 10/27/2017 CLINICAL DATA:  Chills and fever with body aches for 3 days. Frontal headache. EXAM: PORTABLE CHEST 1 VIEW COMPARISON:  Chest x-ray dated 10/04/2017. FINDINGS: The heart size and mediastinal contours are within normal limits. Both lungs are clear. The visualized skeletal structures are unremarkable. IMPRESSION: No active disease.  No evidence of pneumonia. Electronically Signed   By: Bary Richard M.D.   On: 10/27/2017 23:27   Ct Renal Stone Study  Result Date: 10/28/2017 CLINICAL DATA:  Headache, abdominal pain, and cough for 2 days. Entire right side is aching. EXAM: CT ABDOMEN AND PELVIS WITHOUT CONTRAST TECHNIQUE: Multidetector CT imaging of the abdomen and pelvis was performed following the standard protocol without IV contrast. COMPARISON:  Ultrasound abdomen 05/20/2012 FINDINGS: Lower chest:  Mild dependent atelectasis in the lung bases. Hepatobiliary: Increased density in the gallbladder likely representing layering sludge. No discrete stones identified. No wall thickening or inflammatory infiltration. No focal liver lesions. No bile duct dilatation. Pancreas: Unremarkable. No pancreatic ductal dilatation or surrounding inflammatory changes. Spleen: Normal in size without focal abnormality. Adrenals/Urinary Tract: No adrenal gland nodules. 3 mm stone in the distal right ureter just above the ureterovesical junction. Moderate right hydronephrosis and hydroureter. Stranding around the right kidney and ureter. Left kidney and ureter are unremarkable. Bladder wall is not thickened and no bladder stones are demonstrated. Stomach/Bowel: Stomach is within normal limits. Appendix appears normal. No evidence of bowel wall thickening, distention, or inflammatory changes. Vascular/Lymphatic: No significant vascular findings are present. No enlarged abdominal or pelvic lymph nodes. Reproductive: Uterus and bilateral adnexa are unremarkable. Other: No abdominal wall hernia or abnormality. No abdominopelvic ascites. Musculoskeletal: Mild degenerative changes in the spine. No destructive bone lesions. IMPRESSION: 3 mm stone in the distal right ureter with moderate proximal obstruction. Probable sludge in the gallbladder without stone or wall thickening. Electronically Signed   By: Burman Nieves M.D.   On: 10/28/2017 01:36    ____________________________________________   PROCEDURES  Critical  Care performed: Yes, see critical care procedure note(s)   Procedure(s) performed:   .Critical Care Performed by: Loleta Rose, MD Authorized by: Loleta Rose, MD   Critical care provider statement:    Critical care time (minutes):  45   Critical care time was exclusive of:  Separately billable procedures and treating other patients   Critical care was necessary to treat or prevent imminent or life-threatening deterioration of the following conditions:  Sepsis   Critical care was time spent personally by me on the following activities:  Development of treatment plan with patient or surrogate, discussions with consultants, evaluation of patient's response to treatment, examination of patient, obtaining history from patient or surrogate, ordering and performing treatments and interventions, ordering and review of laboratory studies, ordering and review of radiographic studies, pulse oximetry, re-evaluation of patient's condition and review of old charts      ____________________________________________   INITIAL IMPRESSION / ASSESSMENT AND PLAN / ED COURSE  As part of my medical decision making, I reviewed the following data within the electronic MEDICAL RECORD NUMBER Nursing notes reviewed and incorporated, Labs reviewed , EKG interpreted , Radiograph reviewed  and A consult was requested and obtained from this/these consultant(s) Urology    Differential diagnosis includes, but is not limited to, nonspecific viral infection, meningitis/encephalitis, community-acquired pneumonia, intra-abdominal infection, UTI/pyelonephritis with or without kidney stone, migraine.  The patient has many positive findings on review of systems and call to narrow down the differential based on the history of present illness.  The physical exam is generally reassuring with acute or localized findings.  She does have diffuse tenderness throughout her lower abdomen and this is 1 of her chief complaints.  She has  absolutely no meningismus and no focal neurological deficits which is reassuring.  I suspect that her headache is more likely to be secondary to fever and decreased p.o. intake over the last several days rather than representing meningitis or encephalitis.  During the course of our history and physical, I did have my usual risks and benefits discussions with her regarding a lumbar puncture, but given the probability that she has another source including intra-abdominal infection or urinary tract infection, we agreed to hold off on a lumbar function as part of the initial evaluation.  I am awaiting  the results of urinalysis and chest x-ray.  Her blood pressure is a little bit low at 93/64 but her heart rate has improved to 84 from an initial 110.  Her oxygen saturation is a little bit on the low side but she is morbidly obese and she may have some degree of restrictive lung disease due to her habitus.  I will start giving IV fluids and initiate a broad workup.  Her initial presentation is most consistent with a generalized viral infection but her influenza screening tests were negative.  Clinical Course as of Oct 28 425  Fri Oct 28, 2017  0057 WNL Lactic Acid, Venous: 0.7 [CF]  0058 No leukocytosis WBC: 7.2 [CF]  0058 Strongly positive UA suggestive of UTI, will being treatment with ceftriaxone 1 g IV.  Patient currently sleeping comfortably.  Will check CT abd/pelvis to make sure she does not have pyelo nor obstructing/infected ureteral stone given history of fever, flank pain, and lower abdominal pain. Leukocytes, UA: (!) MODERATE [CF]  0151 obstructive 3-mm stone in right distal ureter.  Given fever, hypotension, UTI, and obstructive stone, I am paging urology to request urgent intervention. CT Renal Stone Study [CF]  0200 Although the patient has no leukocytosis, she is febrile and was initially tachycardic and is hypotensive.  She qualifies for code sepsis  [CF]  0203 Procalcitonin: 0.28 [CF]  0210 By  phone with Dr. Lonna Cobb and explained the severity of the patient's illness and condition and he said he will be right and to take care of her.  He is calling the house supervisor now to add on a case to the operating room.  I will let the hospitalist know that she will need to be admitted after her urological procedure.  Patient increasingly hypotensive at about 85/50, I am starting a third liter of fluids.    [CF]    Clinical Course User Index [CF] Loleta Rose, MD    ____________________________________________  FINAL CLINICAL IMPRESSION(S) / ED DIAGNOSES  Final diagnoses:  Septic shock (HCC)  Right ureteral stone  Urinary tract infection without hematuria, site unspecified     MEDICATIONS GIVEN DURING THIS VISIT:  Medications  oxyCODONE (Oxy IR/ROXICODONE) immediate release tablet 5 mg (not administered)    Or  oxyCODONE (ROXICODONE) 5 MG/5ML solution 5 mg (not administered)  fentaNYL (SUBLIMAZE) injection 25-50 mcg (25 mcg Intravenous Given 10/28/17 0420)  HYDROmorphone (DILAUDID) injection 0.25-0.5 mg (not administered)  promethazine (PHENERGAN) injection 6.25-12.5 mg (not administered)  acetaminophen (TYLENOL) tablet 1,000 mg (1,000 mg Oral Given 10/27/17 2128)  sodium chloride 0.9 % bolus 1,000 mL (0 mLs Intravenous Stopped 10/28/17 0208)  metoCLOPramide (REGLAN) injection 10 mg (10 mg Intravenous Given 10/28/17 0012)  ketorolac (TORADOL) 30 MG/ML injection 15 mg (15 mg Intravenous Given 10/28/17 0011)  diphenhydrAMINE (BENADRYL) injection 12.5 mg (12.5 mg Intravenous Given 10/28/17 0012)  sodium chloride 0.9 % bolus 1,000 mL (0 mLs Intravenous Stopped 10/28/17 0208)  cefTRIAXone (ROCEPHIN) 1 g in dextrose 5 % 50 mL IVPB (0 g Intravenous Stopped 10/28/17 0208)  cefTRIAXone (ROCEPHIN) 1 g in dextrose 5 % 50 mL IVPB (0 g Intravenous Stopped 10/28/17 0238)  sodium chloride 0.9 % bolus 2,000 mL (2,000 mLs Intravenous New Bag/Given 10/28/17 0216)  albuterol (PROVENTIL) (2.5 MG/3ML) 0.083%  nebulizer solution 2.5 mg (2.5 mg Nebulization Given 10/28/17 0410)     ED Discharge Orders    None       Note:  This document was prepared using Dragon voice recognition software and may  include unintentional dictation errors.    Loleta Rose, MD 10/28/17 [phone]

## 2017-10-28 ENCOUNTER — Other Ambulatory Visit: Payer: Self-pay

## 2017-10-28 ENCOUNTER — Emergency Department: Payer: Self-pay | Admitting: Certified Registered Nurse Anesthetist

## 2017-10-28 ENCOUNTER — Encounter: Payer: Self-pay | Admitting: Anesthesiology

## 2017-10-28 ENCOUNTER — Emergency Department: Payer: Self-pay

## 2017-10-28 ENCOUNTER — Encounter
Admission: EM | Disposition: A | Discharge status: Home / Self Care | Payer: Self-pay | Source: Home / Self Care | Attending: Internal Medicine

## 2017-10-28 DIAGNOSIS — A419 Sepsis, unspecified organism: Principal | ICD-10-CM

## 2017-10-28 DIAGNOSIS — R6521 Severe sepsis with septic shock: Secondary | ICD-10-CM

## 2017-10-28 DIAGNOSIS — N201 Calculus of ureter: Secondary | ICD-10-CM

## 2017-10-28 DIAGNOSIS — N390 Urinary tract infection, site not specified: Secondary | ICD-10-CM

## 2017-10-28 DIAGNOSIS — N132 Hydronephrosis with renal and ureteral calculous obstruction: Secondary | ICD-10-CM

## 2017-10-28 HISTORY — PX: CYSTOSCOPY WITH STENT PLACEMENT: SHX5790

## 2017-10-28 LAB — BLOOD CULTURE ID PANEL (REFLEXED)
ACINETOBACTER BAUMANNII: NOT DETECTED
CANDIDA KRUSEI: NOT DETECTED
CANDIDA PARAPSILOSIS: NOT DETECTED
CANDIDA TROPICALIS: NOT DETECTED
Candida albicans: NOT DETECTED
Candida glabrata: NOT DETECTED
ESCHERICHIA COLI: NOT DETECTED
Enterobacter cloacae complex: NOT DETECTED
Enterobacteriaceae species: NOT DETECTED
Enterococcus species: NOT DETECTED
HAEMOPHILUS INFLUENZAE: NOT DETECTED
KLEBSIELLA OXYTOCA: NOT DETECTED
KLEBSIELLA PNEUMONIAE: NOT DETECTED
Listeria monocytogenes: NOT DETECTED
Methicillin resistance: NOT DETECTED
Neisseria meningitidis: NOT DETECTED
PSEUDOMONAS AERUGINOSA: NOT DETECTED
Proteus species: NOT DETECTED
SERRATIA MARCESCENS: NOT DETECTED
STAPHYLOCOCCUS AUREUS BCID: NOT DETECTED
STREPTOCOCCUS PNEUMONIAE: NOT DETECTED
Staphylococcus species: DETECTED — AB
Streptococcus agalactiae: NOT DETECTED
Streptococcus pyogenes: NOT DETECTED
Streptococcus species: NOT DETECTED

## 2017-10-28 LAB — BASIC METABOLIC PANEL
ANION GAP: 5 (ref 5–15)
BUN: 5 mg/dL — AB (ref 6–20)
CALCIUM: 7.3 mg/dL — AB (ref 8.9–10.3)
CO2: 21 mmol/L — ABNORMAL LOW (ref 22–32)
CREATININE: 0.65 mg/dL (ref 0.44–1.00)
Chloride: 110 mmol/L (ref 101–111)
GFR calc Af Amer: >60 mL/min (ref >60)
GFR calc non Af Amer: >60 mL/min (ref >60)
GLUCOSE: 123 mg/dL — AB (ref 65–99)
Potassium: 3.5 mmol/L (ref 3.5–5.1)
Sodium: 136 mmol/L (ref 135–145)

## 2017-10-28 LAB — URINALYSIS, COMPLETE (UACMP) WITH MICROSCOPIC
Bilirubin Urine: NEGATIVE
Glucose, UA: NEGATIVE mg/dL
Ketones, ur: NEGATIVE mg/dL
NITRITE: NEGATIVE
PH: 7 (ref 5.0–8.0)
Protein, ur: NEGATIVE mg/dL
SPECIFIC GRAVITY, URINE: 1.014 (ref 1.005–1.030)

## 2017-10-28 LAB — COMPREHENSIVE METABOLIC PANEL
ALT: 14 U/L (ref 14–54)
ANION GAP: 10 (ref 5–15)
AST: 15 U/L (ref 15–41)
Albumin: 3.4 g/dL — ABNORMAL LOW (ref 3.5–5.0)
Alkaline Phosphatase: 50 U/L (ref 38–126)
BUN: 5 mg/dL — ABNORMAL LOW (ref 6–20)
CHLORIDE: 101 mmol/L (ref 101–111)
CO2: 23 mmol/L (ref 22–32)
Calcium: 8.6 mg/dL — ABNORMAL LOW (ref 8.9–10.3)
Creatinine, Ser: 0.76 mg/dL (ref 0.44–1.00)
GFR calc non Af Amer: >60 mL/min (ref >60)
Glucose, Bld: 104 mg/dL — ABNORMAL HIGH (ref 65–99)
Potassium: 3.2 mmol/L — ABNORMAL LOW (ref 3.5–5.1)
SODIUM: 134 mmol/L — AB (ref 135–145)
Total Bilirubin: 0.6 mg/dL (ref 0.3–1.2)
Total Protein: 7.4 g/dL (ref 6.5–8.1)

## 2017-10-28 LAB — CBC
HCT: 31 % — ABNORMAL LOW (ref 35.0–47.0)
HEMOGLOBIN: 9.8 g/dL — AB (ref 12.0–16.0)
MCH: 24.1 pg — AB (ref 26.0–34.0)
MCHC: 31.7 g/dL — AB (ref 32.0–36.0)
MCV: 75.9 fL — ABNORMAL LOW (ref 80.0–100.0)
PLATELETS: 245 10*3/uL (ref 150–440)
RBC: 4.09 MIL/uL (ref 3.80–5.20)
RDW: 18.5 % — AB (ref 11.5–14.5)
WBC: 5 10*3/uL (ref 3.6–11.0)

## 2017-10-28 LAB — CBC WITH DIFFERENTIAL/PLATELET
BASOS ABS: 0.1 10*3/uL (ref 0–0.1)
BASOS PCT: 1 %
EOS ABS: 0.1 10*3/uL (ref 0–0.7)
Eosinophils Relative: 1 %
HEMATOCRIT: 33.9 % — AB (ref 35.0–47.0)
HEMOGLOBIN: 10.9 g/dL — AB (ref 12.0–16.0)
Lymphocytes Relative: 25 %
Lymphs Abs: 1.8 10*3/uL (ref 1.0–3.6)
MCH: 23.9 pg — ABNORMAL LOW (ref 26.0–34.0)
MCHC: 32.1 g/dL (ref 32.0–36.0)
MCV: 74.7 fL — ABNORMAL LOW (ref 80.0–100.0)
MONO ABS: 0.9 10*3/uL (ref 0.2–0.9)
Monocytes Relative: 12 %
NEUTROS ABS: 4.4 10*3/uL (ref 1.4–6.5)
NEUTROS PCT: 61 %
Platelets: 331 10*3/uL (ref 150–440)
RBC: 4.54 MIL/uL (ref 3.80–5.20)
RDW: 18.7 % — AB (ref 11.5–14.5)
WBC: 7.2 10*3/uL (ref 3.6–11.0)

## 2017-10-28 LAB — MRSA PCR SCREENING: MRSA BY PCR: NEGATIVE

## 2017-10-28 LAB — PROTIME-INR
INR: 1.05
PROTHROMBIN TIME: 13.6 s (ref 11.4–15.2)

## 2017-10-28 LAB — PROCALCITONIN: PROCALCITONIN: 0.28 ng/mL

## 2017-10-28 LAB — PREGNANCY, URINE: PREG TEST UR: NEGATIVE

## 2017-10-28 LAB — HCG, QUANTITATIVE, PREGNANCY: hCG, Beta Chain, Quant, S: <1 m[IU]/mL (ref <5)

## 2017-10-28 LAB — BRAIN NATRIURETIC PEPTIDE: B NATRIURETIC PEPTIDE 5: 10 pg/mL (ref 0.0–100.0)

## 2017-10-28 LAB — LACTIC ACID, PLASMA
LACTIC ACID, VENOUS: 0.7 mmol/L (ref 0.5–1.9)
LACTIC ACID, VENOUS: 0.7 mmol/L (ref 0.5–1.9)
Lactic Acid, Venous: 0.6 mmol/L (ref 0.5–1.9)

## 2017-10-28 LAB — LIPASE, BLOOD: LIPASE: 22 U/L (ref 11–51)

## 2017-10-28 LAB — GLUCOSE, CAPILLARY: GLUCOSE-CAPILLARY: 103 mg/dL — AB (ref 65–99)

## 2017-10-28 LAB — TROPONIN I

## 2017-10-28 SURGERY — CYSTOSCOPY, WITH STENT INSERTION
Anesthesia: General | Site: Ureter | Laterality: Right | Wound class: Clean Contaminated

## 2017-10-28 MED ORDER — ONDANSETRON HCL 4 MG/2ML IJ SOLN
INTRAMUSCULAR | Status: DC | PRN
  Administered 2017-10-28: 4 mg via INTRAVENOUS

## 2017-10-28 MED ORDER — DEXTROSE 5 % IV SOLN
1.0000 g | INTRAVENOUS | Status: AC
  Administered 2017-10-28: 1 g via INTRAVENOUS
  Filled 2017-10-28: qty 10

## 2017-10-28 MED ORDER — FENTANYL CITRATE (PF) 100 MCG/2ML IJ SOLN
INTRAMUSCULAR | Status: AC
  Filled 2017-10-28: qty 2

## 2017-10-28 MED ORDER — IPRATROPIUM-ALBUTEROL 0.5-2.5 (3) MG/3ML IN SOLN
RESPIRATORY_TRACT | Status: AC
  Filled 2017-10-28: qty 3

## 2017-10-28 MED ORDER — FENTANYL CITRATE (PF) 100 MCG/2ML IJ SOLN
25.0000 ug | INTRAMUSCULAR | Status: DC | PRN
  Administered 2017-10-28 (×4): 25 ug via INTRAVENOUS

## 2017-10-28 MED ORDER — SODIUM CHLORIDE 0.9 % IV SOLN
INTRAVENOUS | Status: DC
  Administered 2017-10-28: 05:00:00 via INTRAVENOUS

## 2017-10-28 MED ORDER — ALBUTEROL SULFATE (2.5 MG/3ML) 0.083% IN NEBU
2.5000 mg | INHALATION_SOLUTION | Freq: Once | RESPIRATORY_TRACT | Status: AC | PRN
  Administered 2017-10-28: 2.5 mg via RESPIRATORY_TRACT

## 2017-10-28 MED ORDER — LIDOCAINE HCL (CARDIAC) 20 MG/ML IV SOLN
INTRAVENOUS | Status: DC | PRN
  Administered 2017-10-28: 100 mg via INTRAVENOUS

## 2017-10-28 MED ORDER — INFLUENZA VAC SPLIT QUAD 0.5 ML IM SUSY: 0.5000 mL | PREFILLED_SYRINGE | INTRAMUSCULAR | Status: DC

## 2017-10-28 MED ORDER — ALBUTEROL SULFATE (2.5 MG/3ML) 0.083% IN NEBU
2.5000 mg | INHALATION_SOLUTION | Freq: Four times a day (QID) | RESPIRATORY_TRACT | Status: DC | PRN
  Administered 2017-10-28: 2.5 mg via RESPIRATORY_TRACT
  Filled 2017-10-28: qty 3

## 2017-10-28 MED ORDER — MORPHINE SULFATE (PF) 2 MG/ML IV SOLN
1.0000 mg | Freq: Once | INTRAVENOUS | Status: AC
  Administered 2017-10-28: 1 mg via INTRAVENOUS
  Filled 2017-10-28: qty 1

## 2017-10-28 MED ORDER — DEXAMETHASONE SODIUM PHOSPHATE 10 MG/ML IJ SOLN
INTRAMUSCULAR | Status: DC | PRN
  Administered 2017-10-28: 10 mg via INTRAVENOUS

## 2017-10-28 MED ORDER — OXYCODONE HCL 5 MG/5ML PO SOLN: 5.0000 mg | Freq: Once | ORAL | Status: DC | PRN

## 2017-10-28 MED ORDER — PROPOFOL 10 MG/ML IV BOLUS
INTRAVENOUS | Status: AC
Start: 2017-10-28 — End: ?
  Filled 2017-10-28: qty 20

## 2017-10-28 MED ORDER — ACETAMINOPHEN 325 MG PO TABS: 650.0000 mg | ORAL_TABLET | Freq: Four times a day (QID) | ORAL | Status: DC | PRN

## 2017-10-28 MED ORDER — SUCCINYLCHOLINE CHLORIDE 20 MG/ML IJ SOLN
INTRAMUSCULAR | Status: DC | PRN
  Administered 2017-10-28: 100 mg via INTRAVENOUS

## 2017-10-28 MED ORDER — PROPOFOL 10 MG/ML IV BOLUS
INTRAVENOUS | Status: DC | PRN
  Administered 2017-10-28: 180 mg via INTRAVENOUS

## 2017-10-28 MED ORDER — OXYCODONE HCL 5 MG PO TABS: 5.0000 mg | ORAL_TABLET | Freq: Once | ORAL | Status: DC | PRN

## 2017-10-28 MED ORDER — PANTOPRAZOLE SODIUM 40 MG PO TBEC
40.0000 mg | DELAYED_RELEASE_TABLET | Freq: Every day | ORAL | Status: DC
  Administered 2017-10-28 – 2017-10-29 (×2): 40 mg via ORAL
  Filled 2017-10-28 (×3): qty 1

## 2017-10-28 MED ORDER — ZOLPIDEM TARTRATE 5 MG PO TABS
5.0000 mg | ORAL_TABLET | Freq: Once | ORAL | Status: AC
  Administered 2017-10-28: 5 mg via ORAL
  Filled 2017-10-28: qty 1

## 2017-10-28 MED ORDER — FENTANYL CITRATE (PF) 100 MCG/2ML IJ SOLN
INTRAMUSCULAR | Status: AC
  Administered 2017-10-28: 25 ug via INTRAVENOUS
  Filled 2017-10-28: qty 2

## 2017-10-28 MED ORDER — ONDANSETRON HCL 4 MG/2ML IJ SOLN: 4.0000 mg | Freq: Four times a day (QID) | INTRAMUSCULAR | Status: DC | PRN

## 2017-10-28 MED ORDER — IOTHALAMATE MEGLUMINE 43 % IV SOLN
INTRAVENOUS | Status: DC | PRN
  Administered 2017-10-28: 5 mL via URETHRAL

## 2017-10-28 MED ORDER — SODIUM CHLORIDE 0.9 % IV BOLUS (SEPSIS)
2000.0000 mL | Freq: Once | INTRAVENOUS | Status: AC
  Administered 2017-10-28: 2000 mL via INTRAVENOUS

## 2017-10-28 MED ORDER — ALBUTEROL SULFATE (2.5 MG/3ML) 0.083% IN NEBU
INHALATION_SOLUTION | RESPIRATORY_TRACT | Status: AC
  Filled 2017-10-28: qty 3

## 2017-10-28 MED ORDER — PHENYLEPHRINE HCL 10 MG/ML IJ SOLN
INTRAMUSCULAR | Status: DC | PRN
  Administered 2017-10-28 (×2): 200 ug via INTRAVENOUS

## 2017-10-28 MED ORDER — ONDANSETRON HCL 4 MG PO TABS: 4.0000 mg | ORAL_TABLET | Freq: Four times a day (QID) | ORAL | Status: DC | PRN

## 2017-10-28 MED ORDER — DEXTROSE 5 % IV SOLN
2.0000 g | INTRAVENOUS | Status: DC
  Administered 2017-10-28: 2 g via INTRAVENOUS
  Filled 2017-10-28: qty 2

## 2017-10-28 MED ORDER — SENNOSIDES-DOCUSATE SODIUM 8.6-50 MG PO TABS
1.0000 | ORAL_TABLET | Freq: Every evening | ORAL | Status: DC | PRN
  Administered 2017-10-28: 1 via ORAL
  Filled 2017-10-28: qty 1

## 2017-10-28 MED ORDER — SODIUM CHLORIDE 0.9 % IV SOLN
INTRAVENOUS | Status: DC | PRN
  Administered 2017-10-28 (×2): via INTRAVENOUS

## 2017-10-28 MED ORDER — FENTANYL CITRATE (PF) 100 MCG/2ML IJ SOLN
INTRAMUSCULAR | Status: DC | PRN
  Administered 2017-10-28 (×2): 50 ug via INTRAVENOUS

## 2017-10-28 MED ORDER — SODIUM CHLORIDE 0.9 % IV SOLN
INTRAVENOUS | Status: DC | PRN
  Administered 2017-10-28: 03:00:00 via INTRAVENOUS

## 2017-10-28 MED ORDER — MOMETASONE FURO-FORMOTEROL FUM 200-5 MCG/ACT IN AERO
2.0000 | INHALATION_SPRAY | Freq: Two times a day (BID) | RESPIRATORY_TRACT | Status: DC
  Administered 2017-10-28 – 2017-10-29 (×2): 2 via RESPIRATORY_TRACT
  Filled 2017-10-28: qty 8.8

## 2017-10-28 MED ORDER — HYDROMORPHONE HCL 1 MG/ML IJ SOLN: 0.2500 mg | INTRAMUSCULAR | Status: DC | PRN

## 2017-10-28 MED ORDER — ACETAMINOPHEN 650 MG RE SUPP: 650.0000 mg | Freq: Four times a day (QID) | RECTAL | Status: DC | PRN

## 2017-10-28 MED ORDER — PROMETHAZINE HCL 25 MG/ML IJ SOLN: 6.2500 mg | INTRAMUSCULAR | Status: DC | PRN

## 2017-10-28 MED ORDER — DEXTROSE 5 % IV SOLN
1.0000 g | INTRAVENOUS | Status: DC
  Filled 2017-10-28 (×2): qty 10

## 2017-10-28 MED ORDER — HYDROCODONE-ACETAMINOPHEN 5-325 MG PO TABS
1.0000 | ORAL_TABLET | ORAL | Status: DC | PRN
  Administered 2017-10-28 (×4): 2 via ORAL
  Administered 2017-10-29: 1 via ORAL
  Administered 2017-10-29 (×3): 2 via ORAL
  Filled 2017-10-28 (×8): qty 2
  Filled 2017-10-28: qty 1

## 2017-10-28 SURGICAL SUPPLY — 21 items
BAG DRAIN CYSTO-URO LG1000N (MISCELLANEOUS) IMPLANT
BRUSH SCRUB EZ  4% CHG (MISCELLANEOUS)
BRUSH SCRUB EZ 4% CHG (MISCELLANEOUS) IMPLANT
CATH URETL 5X70 OPEN END (CATHETERS) ×3 IMPLANT
CONRAY 43 FOR UROLOGY 50M (MISCELLANEOUS) ×3 IMPLANT
GLOVE BIOGEL PI IND STRL 8 (GLOVE) ×2 IMPLANT
GLOVE BIOGEL PI INDICATOR 8 (GLOVE) ×4
GOWN STANDARD XL  REUSABL (MISCELLANEOUS) ×6 IMPLANT
KIT TURNOVER CYSTO (KITS) ×3 IMPLANT
PACK CYSTO AR (MISCELLANEOUS) ×3 IMPLANT
SENSORWIRE 0.038 NOT ANGLED (WIRE) ×3
SET CYSTO W/LG BORE CLAMP LF (SET/KITS/TRAYS/PACK) ×3 IMPLANT
SOL .9 NS 3000ML IRR  AL (IV SOLUTION) ×2
SOL .9 NS 3000ML IRR UROMATIC (IV SOLUTION) ×1 IMPLANT
STENT URET 6FRX22 CONTOUR (STENTS) ×3 IMPLANT
STENT URET 6FRX24 CONTOUR (STENTS) IMPLANT
STENT URET 6FRX26 CONTOUR (STENTS) IMPLANT
SURGILUBE 2OZ TUBE FLIPTOP (MISCELLANEOUS) ×3 IMPLANT
SYRINGE IRR TOOMEY STRL 70CC (SYRINGE) ×3 IMPLANT
WATER STERILE IRR 1000ML POUR (IV SOLUTION) ×3 IMPLANT
WIRE SENSOR 0.038 NOT ANGLED (WIRE) ×1 IMPLANT

## 2017-10-28 NOTE — Progress Notes (Signed)
PHARMACY - PHYSICIAN COMMUNICATION CRITICAL VALUE ALERT - BLOOD CULTURE IDENTIFICATION (BCID)  Amy Pennington is an 37 y.o. female [facility] with a chief complaint of migraine w/ fever found to have ureteral obstruction on CT renal  Assessment:  Febrile (102.3), tachycardic, hypotensive, CXR NG for PNA (include suspected source if known)  Name of physician (or Provider) Contacted: Cammy CopaAngela Maier  Current antibiotics: Ceftriaxone 1g IV daily  Changes to prescribed antibiotics recommended:  Patient is on recommended antibiotics - No changes needed -- 1/4 GPC Staph possible contamination  Results for orders placed or performed during the hospital encounter of 10/27/17  Blood Culture ID Panel (Reflexed) (Collected: 10/27/2017 11:25 PM)  Result Value Ref Range   Enterococcus species NOT DETECTED NOT DETECTED   Listeria monocytogenes NOT DETECTED NOT DETECTED   Staphylococcus species DETECTED (A) NOT DETECTED   Staphylococcus aureus NOT DETECTED NOT DETECTED   Methicillin resistance NOT DETECTED NOT DETECTED   Streptococcus species NOT DETECTED NOT DETECTED   Streptococcus agalactiae NOT DETECTED NOT DETECTED   Streptococcus pneumoniae NOT DETECTED NOT DETECTED   Streptococcus pyogenes NOT DETECTED NOT DETECTED   Acinetobacter baumannii NOT DETECTED NOT DETECTED   Enterobacteriaceae species NOT DETECTED NOT DETECTED   Enterobacter cloacae complex NOT DETECTED NOT DETECTED   Escherichia coli NOT DETECTED NOT DETECTED   Klebsiella oxytoca NOT DETECTED NOT DETECTED   Klebsiella pneumoniae NOT DETECTED NOT DETECTED   Proteus species NOT DETECTED NOT DETECTED   Serratia marcescens NOT DETECTED NOT DETECTED   Haemophilus influenzae NOT DETECTED NOT DETECTED   Neisseria meningitidis NOT DETECTED NOT DETECTED   Pseudomonas aeruginosa NOT DETECTED NOT DETECTED   Candida albicans NOT DETECTED NOT DETECTED   Candida glabrata NOT DETECTED NOT DETECTED   Candida  krusei NOT DETECTED NOT DETECTED   Candida parapsilosis NOT DETECTED NOT DETECTED   Candida tropicalis NOT DETECTED NOT DETECTED    Thomasene Rippleavid Morio Widen, PharmD, BCPS Clinical Pharmacist 10/28/2017

## 2017-10-28 NOTE — Consult Note (Signed)
Name: Amy Pennington MRN: [id number] DOB: Jan 23, 1981    ADMISSION DATE:  10/27/2017 CONSULTATION DATE: 10/27/2017  REFERRING MD : Dr. Tobi BastosPyreddy   CHIEF COMPLAINT: Chills and Fever   BRIEF PATIENT DESCRIPTION:  [age] yo female admitted with urosepsis secondary to obstructing right distal ureteral calculus s/p cystoscopy and right ureteral stent placement on 02/1  SIGNIFICANT EVENTS  02/1-Pt admitted to stepdown unit   STUDIES:  CT Renal Stone Study 02/1>>3 mm stone in the distal right ureter with moderate proximal obstruction. Probable sludge in the gallbladder without stone or wall thickening.  HISTORY OF PRESENT ILLNESS:   This is a 37 yo female with a PMH of Schizoaffective Disorder, Bipolar 1 Disorder, Depression, Asthma, Migraines, and GERD.  She presented to [facility] ER 01/31 with c/o chills, moderate to severe right lower quadrant pain, and malaise onset of symptoms 3 days prior to presentation.  In the ER she was found to be hypotensive and febrile with temp 102.3 F ruling her in for sepsis, therefore she received iv fluids and abx.  CT Renal Stone Study revealed an obstructing right distal ureteral stone, UA positive for UTI, CXR negative, and Influenza Panel negative.  Due to CT results Urology consulted pt underwent a cystoscopy and right ureteral stent placement on 10/28/17.  She was admitted to the stepdown unit by hospitalist postop for further workup and treatment.   PAST MEDICAL HISTORY :   has a past medical history of Asthma, Bipolar 1 disorder (HCC), Depression, GERD (gastroesophageal reflux disease), Migraines, and Schizo affective schizophrenia (HCC).  has a past surgical history that includes none. Prior to Admission medications   Medication Sig Start Date End Date Taking? Authorizing Provider  albuterol (PROVENTIL HFA;VENTOLIN HFA) 108 (90 Base) MCG/ACT inhaler Inhale 2 puffs into the lungs every 6 (six) hours as needed for wheezing or shortness of breath. Patient not  taking: Reported on 10/28/2017 09/12/16   Hower, Cletis Athensavid K, MD  albuterol (PROVENTIL HFA;VENTOLIN HFA) 108 (90 Base) MCG/ACT inhaler Inhale 2 puffs into the lungs every 6 (six) hours as needed for wheezing or shortness of breath. Patient not taking: Reported on 10/28/2017 10/04/17   Joni ReiningSmith, Ronald K, PA-C  albuterol (PROVENTIL) (2.5 MG/3ML) 0.083% nebulizer solution Take 3 mLs (2.5 mg total) by nebulization every 6 (six) hours as needed for wheezing or shortness of breath. Patient not taking: Reported on 10/28/2017 09/12/16   Hower, Cletis Athensavid K, MD  albuterol-ipratropium (COMBIVENT) 18-103 MCG/ACT inhaler Inhale 1 puff into the lungs every 4 (four) hours as needed for wheezing or shortness of breath. Patient not taking: Reported on 10/28/2017 09/12/16   Hower, Cletis Athensavid K, MD  guaiFENesin-codeine 100-10 MG/5ML syrup Take 10 mLs by mouth every 4 (four) hours as needed for cough. Patient not taking: Reported on 10/28/2017 09/12/16   Hower, Cletis Athensavid K, MD  hydrocortisone valerate ointment (WESTCORT) 0.2 % Apply to affected area daily Patient not taking: Reported on 10/28/2017 10/04/17 10/04/18  Joni ReiningSmith, Ronald K, PA-C  methylPREDNISolone (MEDROL DOSEPAK) 4 MG TBPK tablet Take Tapered dose as directed Patient not taking: Reported on 10/28/2017 10/04/17   Joni ReiningSmith, Ronald K, PA-C  mometasone-formoterol (DULERA) 200-5 MCG/ACT AERO Inhale 2 puffs into the lungs 2 (two) times daily. Patient not taking: Reported on 10/28/2017 09/12/16   Hower, Cletis Athensavid K, MD  nicotine (NICODERM CQ - DOSED IN MG/24 HOURS) 21 mg/24hr patch Place 1 patch (21 mg total) onto the skin daily. Patient not taking: Reported on 10/28/2017 09/13/16   Hower, Cletis Athensavid K, MD  predniSONE (  DELTASONE) 10 MG tablet 40mg  x1 day, 20mg  x2 day, 10mg  x2 day then stop Patient not taking: Reported on 10/28/2017 09/12/16   Hower, Cletis Athens, MD  traMADol (ULTRAM) 50 MG tablet Take 1 tablet (50 mg total) by mouth every 6 (six) hours as needed for moderate pain. Patient not taking: Reported on 10/28/2017  09/12/16   Hower, Cletis Athens, MD   No Known Allergies  FAMILY HISTORY:  family history includes Asthma in her mother; Depression in her mother; Diabetes in her brother. She was adopted. SOCIAL HISTORY:  reports that she has been smoking cigarettes.  She has a 15.00 pack-year smoking history. she has never used smokeless tobacco. She reports that she drinks about 0.6 oz of alcohol per week. She reports that she uses drugs. Drugs: Cocaine and Marijuana. Frequency: 2.00 times per week.  REVIEW OF SYSTEMS: Positives in BOLD  Constitutional: fever, chills, weight loss, malaise/fatigue and diaphoresis.  HENT: Negative for hearing loss, ear pain, nosebleeds, congestion, sore throat, neck pain, tinnitus and ear discharge.   Eyes: Negative for blurred vision, double vision, photophobia, pain, discharge and redness.  Respiratory: Negative for cough, hemoptysis, sputum production, shortness of breath, wheezing and stridor.   Cardiovascular: Negative for chest pain, palpitations, orthopnea, claudication, leg swelling and PND.  Gastrointestinal: heartburn, nausea, vomiting, right sided abdominal pain, diarrhea, constipation, blood in stool and melena.  Genitourinary: Negative for dysuria, urgency, frequency, hematuria and flank pain.  Musculoskeletal: Negative for myalgias, back pain, joint pain and falls.  Skin: Negative for itching and rash.  Neurological: Negative for dizziness, tingling, tremors, sensory change, speech change, focal weakness, seizures, loss of consciousness, weakness and headaches.  Endo/Heme/Allergies: Negative for environmental allergies and polydipsia. Does not bruise/bleed easily.  SUBJECTIVE:  Pt c/o 8/10 abdominal pain   VITAL SIGNS: Temp:  [97.1 F (36.2 C)-102.3 F (39.1 C)] 97.1 F (36.2 C) (02/01 0345) Pulse Rate:  [72-110] 76 (02/01 0345) Resp:  [13-19] 16 (02/01 0345) BP: (80-116)/(44-64) 98/49 (02/01 0345) SpO2:  [95 %-100 %] 100 % (02/01 0345) Weight:  [121.1 kg  (267 lb)] 121.1 kg (267 lb) (01/31 2123)  PHYSICAL EXAMINATION: General: well developed, well nourished female, NAD  Neuro: alert and oriented, follows commands  HEENT: supple, no JVD Cardiovascular: nsr, s1s2, no M/R/G Lungs: clear throughout, even, non labored  Abdomen: +BS x4, soft, abdominal tenderness, non distended  Musculoskeletal: normal bulk and tone, no edema  Skin: no rashes or lesions   Recent Labs  Lab 10/27/17 2325  NA 134*  K 3.2*  CL 101  CO2 23  BUN 5*  CREATININE 0.76  GLUCOSE 104*   Recent Labs  Lab 10/27/17 2325  HGB 10.9*  HCT 33.9*  WBC 7.2  PLT 331   Dg Chest Port 1 View  Result Date: 10/27/2017 CLINICAL DATA:  Chills and fever with body aches for 3 days. Frontal headache. EXAM: PORTABLE CHEST 1 VIEW COMPARISON:  Chest x-ray dated 10/04/2017. FINDINGS: The heart size and mediastinal contours are within normal limits. Both lungs are clear. The visualized skeletal structures are unremarkable. IMPRESSION: No active disease.  No evidence of pneumonia. Electronically Signed   By: Bary Richard M.D.   On: 10/27/2017 23:27   Ct Renal Stone Study  Result Date: 10/28/2017 CLINICAL DATA:  Headache, abdominal pain, and cough for 2 days. Entire right side is aching. EXAM: CT ABDOMEN AND PELVIS WITHOUT CONTRAST TECHNIQUE: Multidetector CT imaging of the abdomen and pelvis was performed following the standard protocol without IV contrast. COMPARISON:  Ultrasound abdomen 05/20/2012 FINDINGS: Lower chest: Mild dependent atelectasis in the lung bases. Hepatobiliary: Increased density in the gallbladder likely representing layering sludge. No discrete stones identified. No wall thickening or inflammatory infiltration. No focal liver lesions. No bile duct dilatation. Pancreas: Unremarkable. No pancreatic ductal dilatation or surrounding inflammatory changes. Spleen: Normal in size without focal abnormality. Adrenals/Urinary Tract: No adrenal gland nodules. 3 mm stone in the  distal right ureter just above the ureterovesical junction. Moderate right hydronephrosis and hydroureter. Stranding around the right kidney and ureter. Left kidney and ureter are unremarkable. Bladder wall is not thickened and no bladder stones are demonstrated. Stomach/Bowel: Stomach is within normal limits. Appendix appears normal. No evidence of bowel wall thickening, distention, or inflammatory changes. Vascular/Lymphatic: No significant vascular findings are present. No enlarged abdominal or pelvic lymph nodes. Reproductive: Uterus and bilateral adnexa are unremarkable. Other: No abdominal wall hernia or abnormality. No abdominopelvic ascites. Musculoskeletal: Mild degenerative changes in the spine. No destructive bone lesions. IMPRESSION: 3 mm stone in the distal right ureter with moderate proximal obstruction. Probable sludge in the gallbladder without stone or wall thickening. Electronically Signed   By: Burman Nieves M.D.   On: 10/28/2017 01:36    ASSESSMENT / PLAN: Urosepsis secondary to obstructing right distal ureteral calculus s/p cystoscopy and right ureteral stent placement on 10/28/17 Hypokalemia  Anemia Postop pain  Hx: Schizoaffective Disorder, Bipolar 1 Disorder, Depression, Asthma, and GERD  P: Supplemental O2 for dyspnea and/or hypoxia Will restart pts outpatient bronchodilator therapy  Pulmonary hygiene  Continue NS @125  ml/hr Maintain map >65 Trend WBC and monitor fever  Trend PCT and lactic acid Follow cultures Continue ceftriaxone Trend BMP  Replace electrolytes as indicated  Monitor UOP  Urology consulted appreciate input  SCD's for VTE prophylaxis  Trend CBC and monitor for s/sx of bleeding  Transfuse for hgb <7 Prn norco for pain management  Clear liquid diet for now   Sonda Rumble, [facility]  Pulmonary/Critical Care Pager [phone] (please enter 7 digits) PCCM Consult Pager [phone] (please enter 7 digits)

## 2017-10-28 NOTE — Progress Notes (Signed)
Report called to Josh RN on 2C 

## 2017-10-28 NOTE — Progress Notes (Signed)
Notified MD of pt request for sleeping med, orders taken.

## 2017-10-28 NOTE — OR Nursing (Signed)
Dr. Randa NgoPiscitello notified about previous drug history. Acknowledged and will proceed with case without toxicology screening.

## 2017-10-28 NOTE — Progress Notes (Signed)
[facility] [place] at [facility]   PATIENT NAME: Amy Pennington    MR#:  [id number]  DATE OF BIRTH:  07-16-1981  SUBJECTIVE:  CHIEF COMPLAINT: Patient is resting comfortably.  Reporting right flank pain n the right side of the abdomen.  Denies any nausea vomiting.  REVIEW OF SYSTEMS:  CONSTITUTIONAL: No fever, fatigue or weakness.  EYES: No blurred or double vision.  EARS, NOSE, AND THROAT: No tinnitus or ear pain.  RESPIRATORY: No cough, shortness of breath, wheezing or hemoptysis.  CARDIOVASCULAR: No chest pain, orthopnea, edema.  GASTROINTESTINAL: No nausea, vomiting, diarrhea  GENITOURINARY: No dysuria, hematuria.  ENDOCRINE: No polyuria, nocturia,  HEMATOLOGY: No anemia, easy bruising or bleeding SKIN: No rash or lesion. MUSCULOSKELETAL: No joint pain or arthritis.   NEUROLOGIC: No tingling, numbness, weakness.  PSYCHIATRY: No anxiety or depression.   DRUG ALLERGIES:  No Known Allergies  VITALS:  Blood pressure (!) 128/59, pulse 60, temperature 97.7 F (36.5 C), temperature source Oral, resp. rate (!) 22, height 5\' 3"  (1.6 m), weight 121.1 kg (267 lb), last menstrual period 10/27/2017, SpO2 100 %.  PHYSICAL EXAMINATION:  GENERAL:  37 y.o.-year-old patient lying in the bed with no acute distress.  EYES: Pupils equal, round, reactive to light and accommodation. No scleral icterus. Extraocular muscles intact.  HEENT: Head atraumatic, normocephalic. Oropharynx and nasopharynx clear.  NECK:  Supple, no jugular venous distention. No thyroid enlargement, no tenderness.  LUNGS: Normal breath sounds bilaterally, no wheezing, rales,rhonchi or crepitation. No use of accessory muscles of respiration.  CARDIOVASCULAR: S1, S2 normal. No murmurs, rubs, or gallops.  ABDOMEN: Soft, right lower quadrant abdominal tenderness nondistended. Bowel sounds present.  Right flank is tender EXTREMITIES: No pedal edema, cyanosis, or clubbing.  NEUROLOGIC: Cranial nerves  II through XII are intact. Muscle strength 5/5 in all extremities. Sensation intact. Gait not checked.  PSYCHIATRIC: The patient is alert and oriented x 3.  SKIN: No obvious rash, lesion, or ulcer.    LABORATORY PANEL:   CBC Recent Labs  Lab 10/28/17 0510  WBC 5.0  HGB 9.8*  HCT 31.0*  PLT 245   ------------------------------------------------------------------------------------------------------------------  Chemistries  Recent Labs  Lab 10/27/17 2325 10/28/17 0510  NA 134* 136  K 3.2* 3.5  CL 101 110  CO2 23 21*  GLUCOSE 104* 123*  BUN 5* 5*  CREATININE 0.76 0.65  CALCIUM 8.6* 7.3*  AST 15  --   ALT 14  --   ALKPHOS 50  --   BILITOT 0.6  --    ------------------------------------------------------------------------------------------------------------------  Cardiac Enzymes Recent Labs  Lab 10/27/17 2325  TROPONINI <0.03   ------------------------------------------------------------------------------------------------------------------  RADIOLOGY:  Dg Chest Port 1 View  Result Date: 10/27/2017 CLINICAL DATA:  Chills and fever with body aches for 3 days. Frontal headache. EXAM: PORTABLE CHEST 1 VIEW COMPARISON:  Chest x-ray dated 10/04/2017. FINDINGS: The heart size and mediastinal contours are within normal limits. Both lungs are clear. The visualized skeletal structures are unremarkable. IMPRESSION: No active disease.  No evidence of pneumonia. Electronically Signed   By: Bary Richard M.D.   On: 10/27/2017 23:27   Ct Renal Stone Study  Result Date: 10/28/2017 CLINICAL DATA:  Headache, abdominal pain, and cough for 2 days. Entire right side is aching. EXAM: CT ABDOMEN AND PELVIS WITHOUT CONTRAST TECHNIQUE: Multidetector CT imaging of the abdomen and pelvis was performed following the standard protocol without IV contrast. COMPARISON:  Ultrasound abdomen 05/20/2012 FINDINGS: Lower chest: Mild dependent atelectasis in the lung bases. Hepatobiliary:  Increased  density in the gallbladder likely representing layering sludge. No discrete stones identified. No wall thickening or inflammatory infiltration. No focal liver lesions. No bile duct dilatation. Pancreas: Unremarkable. No pancreatic ductal dilatation or surrounding inflammatory changes. Spleen: Normal in size without focal abnormality. Adrenals/Urinary Tract: No adrenal gland nodules. 3 mm stone in the distal right ureter just above the ureterovesical junction. Moderate right hydronephrosis and hydroureter. Stranding around the right kidney and ureter. Left kidney and ureter are unremarkable. Bladder wall is not thickened and no bladder stones are demonstrated. Stomach/Bowel: Stomach is within normal limits. Appendix appears normal. No evidence of bowel wall thickening, distention, or inflammatory changes. Vascular/Lymphatic: No significant vascular findings are present. No enlarged abdominal or pelvic lymph nodes. Reproductive: Uterus and bilateral adnexa are unremarkable. Other: No abdominal wall hernia or abnormality. No abdominopelvic ascites. Musculoskeletal: Mild degenerative changes in the spine. No destructive bone lesions. IMPRESSION: 3 mm stone in the distal right ureter with moderate proximal obstruction. Probable sludge in the gallbladder without stone or wall thickening. Electronically Signed   By: Burman NievesWilliam  Stevens M.D.   On: 10/28/2017 01:36    EKG:   Orders placed or performed during the hospital encounter of 10/27/17  . EKG 12-Lead  . EKG 12-Lead    ASSESSMENT AND PLAN:   37 year old female patient with history of bipolar disorder GERD, migraine, schizoaffective schizophrenia presented to the emergency room with flank pain and fever.   1.  Obstructive uropathy CT kidney has revealed 3 mm stone in the distal right ureter with obstruction Urology  recommended stone extraction and ureteral stent placement, patient had stent placement for 3 mm obstructing right ureteral calculus Urology  is recommending outpatient ureteroscopy and stent removal  2.  Urosepsis Cultures are pending IV Rocephin and IV fluids   3.  Hypotension Continue IV fluids PCA.  Patient does not need any pressors  4.  GERD ppi   5.  Bipolar disorder Not on any home medications       All the records are reviewed and case discussed with Care Management/Social Workerr. Management plans discussed with the patient, family and they are in agreement.  CODE STATUS: fc   TOTAL TIME TAKING CARE OF THIS PATIENT: 36  minutes.   POSSIBLE D/C IN 1-2  DAYS, DEPENDING ON CLINICAL CONDITION.  Note: This dictation was prepared with Dragon dictation along with smaller phrase technology. Any transcriptional errors that result from this process are unintentional.   Ramonita LabAruna Homero Hyson M.D on 10/28/2017 at 2:46 PM  Between 7am to 6pm - Pager - [phone] After 6pm go to [email or web address] - password [email or web address]  Fabio [facility] [place] [facility]  Office  [phone]  CC: Primary care physician; Patient, No Pcp Per

## 2017-10-28 NOTE — Op Note (Signed)
Preoperative diagnosis:  1. Obstructing right distal ureteral calculus with sepsis  Postoperative diagnosis:  1. Obstructing right distal ureteral calculus with sepsis  Procedure:  1. Cystoscopy 2. Right ureteral stent placement 3. Right retrograde pyelography with interpretation   Surgeon: Lorin PicketScott C. Casy Tavano, M.D.  Anesthesia: General  Complications: None  Intraoperative findings:  Retrograde pyelogram-mild right hydronephrosis and hydroureter.  EBL: Minimal  Specimens: None  Indication: Amy Pennington is a 37 y.o. patient with a 3-day history of right lower quadrant abdominal pain, fever, chills and nausea.  CT of the abdomen pelvis remarkable for 3 mm right distal ureteral calculus with proximal hydroureter.  She presents for emergent stent placement. After reviewing the management options for treatment, he elected to proceed with the above surgical procedure(s). We have discussed the potential benefits and risks of the procedure, side effects of the proposed treatment, the likelihood of the patient achieving the goals of the procedure, and any potential problems that might occur during the procedure or recuperation. Informed consent has been obtained.  Description of procedure:  The patient was taken to the operating room and general anesthesia was induced.  The patient was placed in the dorsal lithotomy position, prepped and draped in the usual sterile fashion, and preoperative antibiotics were administered. A preoperative time-out was performed.   Cystourethroscopy was performed.  The patient's urethra was examined and was normal. The bladder was then systematically examined in its entirety. There was no evidence for any bladder tumors, stones.  Mild mucosal erythema was noted.  The right ureteral orifice demonstrated no efflux.  Attention then turned to the right ureteral orifice and a 0.038 Sensor wire was placed through a 6 [place]French open-ended ureteral catheter position at the  ureteral orifice.  The wire was advanced up the ureter under fluoroscopic guidance without difficulty.  The open-ended catheter was then advanced over the wire to the renal pelvis.  The wire was withdrawn.  Omnipaque contrast was injected through the ureteral catheter and a retrograde pyelogram was performed with findings as dictated above.  Urine was aspirated from the right renal pelvis and sent for culture.  The sensor wire was then replaced and the ureteral catheter was removed.  A 6 French/22 cm double-J ureteral stent was advanced over the wire.  Adequate position was noted in the renal pelvis under fluoroscopy and the distal and the stent was well-positioned in the bladder under direct vision.  There was brisk efflux of urine noted through the distal end of the stent.  The bladder was then emptied and the procedure ended.  The patient appeared to tolerate the procedure well and without complications.  She was taken to PACU and stable condition after anesthetic reversal.

## 2017-10-28 NOTE — Anesthesia Preprocedure Evaluation (Signed)
Anesthesia Evaluation  Patient identified by MRN, date of birth, ID band Patient awake    Reviewed: Allergy & Precautions, H&P , NPO status , Patient's Chart, lab work & pertinent test results  Airway Mallampati: III  TM Distance: <3 FB Neck ROM: full    Dental  (+) Chipped, Poor Dentition   Pulmonary neg shortness of breath, asthma , Current Smoker,           Cardiovascular Exercise Tolerance: Good (-) angina(-) Past MI and (-) DOE      Neuro/Psych  Headaches, PSYCHIATRIC DISORDERS Depression Bipolar Disorder Schizophrenia    GI/Hepatic Neg liver ROS, GERD  Medicated and Controlled,  Endo/Other  negative endocrine ROS  Renal/GU      Musculoskeletal   Abdominal   Peds  Hematology negative hematology ROS (+)   Anesthesia Other Findings Past Medical History: No date: Asthma No date: Bipolar 1 disorder (HCC) No date: Depression No date: GERD (gastroesophageal reflux disease) No date: Migraines No date: Schizo affective schizophrenia (HCC)  Past Surgical History: No date: none  BMI    Body Mass Index:  47.30 kg/m      Reproductive/Obstetrics negative OB ROS                             Anesthesia Physical Anesthesia Plan  ASA: IV and emergent  Anesthesia Plan: General ETT, Rapid Sequence and Cricoid Pressure   Post-op Pain Management:    Induction: Intravenous  PONV Risk Score and Plan: Ondansetron, Dexamethasone, Midazolam and Treatment may vary due to age or medical condition  Airway Management Planned: Oral ETT and Video Laryngoscope Planned  Additional Equipment:   Intra-op Plan:   Post-operative Plan: Extubation in OR  Informed Consent: I have reviewed the patients History and Physical, chart, labs and discussed the procedure including the risks, benefits and alternatives for the proposed anesthesia with the patient or authorized representative who has indicated  his/her understanding and acceptance.   Dental Advisory Given  Plan Discussed with: Anesthesiologist, CRNA and Surgeon  Anesthesia Plan Comments: (Patient consented for risks of anesthesia including but not limited to:  - adverse reactions to medications - damage to teeth, lips or other oral mucosa - sore throat or hoarseness - Damage to heart, brain, lungs or loss of life  Patient voiced understanding.)        Anesthesia Quick Evaluation

## 2017-10-28 NOTE — Anesthesia Postprocedure Evaluation (Addendum)
Anesthesia Post Note  Patient: Amy Pennington  Procedure(s) Performed: CYSTOSCOPY WITH right ureteral STENT PLACEMENT (Right Ureter)  Patient location during evaluation: PACU Anesthesia Type: General Level of consciousness: awake and alert Pain management: pain level controlled Vital Signs Assessment: post-procedure vital signs reviewed and stable Respiratory status: spontaneous breathing, nonlabored ventilation, respiratory function stable and patient connected to nasal cannula oxygen Cardiovascular status: blood pressure returned to baseline and stable Postop Assessment: no apparent nausea or vomiting Anesthetic complications: no     Last Vitals:  Vitals:   10/28/17 0600 10/28/17 0700  BP: (!) 101/57 114/66  Pulse: 71 64  Resp: 13 14  Temp:    SpO2: 94% 96%    Last Pain:  Vitals:   10/28/17 0500  TempSrc: Oral  PainSc: 7                  Cleda MccreedyJoseph K Kvion Shapley

## 2017-10-28 NOTE — Anesthesia Procedure Notes (Signed)
Procedure Name: Intubation Performed by: Malva CoganBeane, Maida Widger, CRNA Pre-anesthesia Checklist: Patient identified, Patient being monitored, Timeout performed, Emergency Drugs available and Suction available Patient Re-evaluated:Patient Re-evaluated prior to induction Oxygen Delivery Method: Circle system utilized Preoxygenation: Pre-oxygenation with 100% oxygen Induction Type: IV induction, Rapid sequence and Cricoid Pressure applied Ventilation: Mask ventilation without difficulty Laryngoscope Size: 3 and McGraph Grade View: Grade I Tube type: Oral Tube size: 7.0 mm Number of attempts: 1 Airway Equipment and Method: Stylet Placement Confirmation: ETT inserted through vocal cords under direct vision,  positive ETCO2 and breath sounds checked- equal and bilateral Secured at: 22 cm Tube secured with: Tape Dental Injury: Teeth and Oropharynx as per pre-operative assessment

## 2017-10-28 NOTE — Progress Notes (Signed)
Urology  Complaining of mild lower abdominal discomfort.  Max temp post stent placement 98.  Urine culture negative at 12 hours.  Intraoperative culture from renal pelvis pending.  Impression: Status post stent placement for 3 mm obstructing right ureteral calculus-clinically improved  Recommendation: Continue plan per primary team.  Will need to schedule follow-up ureteroscopy/stent removal as an outpatient.

## 2017-10-28 NOTE — Progress Notes (Signed)
Pt with unrelieved pain after taking norco. Talked with Dr. Amado CoeGouru and she verbally gave order for morphine 1 mg IV as a one time dose.

## 2017-10-28 NOTE — Anesthesia Post-op Follow-up Note (Signed)
Anesthesia QCDR form completed.        

## 2017-10-28 NOTE — Consult Note (Signed)
Urology Consult  I have been asked to see the patient by Dr.Forbach, for evaluation and management of an obstructing right ureteral stone with sepsis.  Chief Complaint: Abdominal pain  History of Present Illness: Amy Pennington is a 37 y.o. year old who [facility] ED on 10/27/2017 with a 3-4-day history of right lower quadrant abdominal pain associated with fever, chills and malaise. There were no identifiable precipitating, aggravating or alleviating factors to her pain.  Pain is rated moderate to severe.  She was febrile to 102 degrees and hypotensive in the ED.  A stone protocol CT of the abdomen pelvis was performed which showed a 3 mm right distal ureteral calculus with proximal hydroureter/hydronephrosis.  She denies dysuria or gross hematuria.  She has nausea without vomiting.  Past Medical History:  Diagnosis Date  . Asthma   . Bipolar 1 disorder (HCC)   . Depression   . GERD (gastroesophageal reflux disease)   . Migraines   . Schizo affective schizophrenia [facility](HCC)     Past Surgical History:  Procedure Laterality Date  . none      Home Medications:  No outpatient medications have been marked as taking for the 10/27/17 encounter [facility](Hospital Encounter).    Allergies: No Known Allergies  Family History  Adopted: Yes  Problem Relation Age of Onset  . Asthma Mother   . Depression Mother   . Diabetes Brother     Social History:  reports that she has been smoking cigarettes.  She has a 15.00 pack-year smoking history. she has never used smokeless tobacco. She reports that she drinks about 0.6 oz of alcohol per week. She reports that she uses drugs. Drugs: Cocaine and Marijuana. Frequency: 2.00 times per week.  ROS: A complete review of systems was performed.  All systems are negative except for pertinent findings as noted.  Physical Exam:  Vital signs in last 24 hours: Temp:  [102.3 F (39.1 C)] 102.3 F (39.1 C) (01/31 2121) Pulse Rate:  [72-110] 77  (02/01 0200) Resp:  [13-19] 15 (02/01 0138) BP: (80-116)/(44-64) 89/48 (02/01 0200) SpO2:  [95 %-100 %] 97 % (02/01 0200) Weight:  [267 lb (121.1 kg)] 267 lb (121.1 kg) (01/31 2123) Constitutional:  Alert and oriented, No acute distress HEENT: [place] AT, moist mucus membranes.  Trachea midline, no masses Cardiovascular: Regular rate and rhythm, no clubbing, cyanosis, or edema. Respiratory: Normal respiratory effort, lungs clear bilaterally GI: Abdomen is soft, nontender, nondistended, no abdominal masses GU: No CVA tenderness Skin: No rashes, bruises or suspicious lesions Lymph: No cervical or inguinal adenopathy Neurologic: Grossly intact, no focal deficits, moving all 4 extremities Psychiatric: Normal mood and affect   Laboratory Data:  Recent Labs    10/27/17 2325  WBC 7.2  HGB 10.9*  HCT 33.9*   Recent Labs    10/27/17 2325  NA 134*  K 3.2*  CL 101  CO2 23  GLUCOSE 104*  BUN 5*  CREATININE 0.76  CALCIUM 8.6*   Recent Labs    10/27/17 2325  INR 1.05    Radiologic Imaging: Dg Chest Port 1 View  Result Date: 10/27/2017 CLINICAL DATA:  Chills and fever with body aches for 3 days. Frontal headache. EXAM: PORTABLE CHEST 1 VIEW COMPARISON:  Chest x-ray dated 10/04/2017. FINDINGS: The heart size and mediastinal contours are within normal limits. Both lungs are clear. The visualized skeletal structures are unremarkable. IMPRESSION: No active disease.  No evidence of pneumonia. Electronically Signed   By:  Bary Richard M.D.   On: 10/27/2017 23:27   Ct Renal Stone Study  Result Date: 10/28/2017 CLINICAL DATA:  Headache, abdominal pain, and cough for 2 days. Entire right side is aching. EXAM: CT ABDOMEN AND PELVIS WITHOUT CONTRAST TECHNIQUE: Multidetector CT imaging of the abdomen and pelvis was performed following the standard protocol without IV contrast. COMPARISON:  Ultrasound abdomen 05/20/2012 FINDINGS: Lower chest: Mild dependent atelectasis in the lung bases.  Hepatobiliary: Increased density in the gallbladder likely representing layering sludge. No discrete stones identified. No wall thickening or inflammatory infiltration. No focal liver lesions. No bile duct dilatation. Pancreas: Unremarkable. No pancreatic ductal dilatation or surrounding inflammatory changes. Spleen: Normal in size without focal abnormality. Adrenals/Urinary Tract: No adrenal gland nodules. 3 mm stone in the distal right ureter just above the ureterovesical junction. Moderate right hydronephrosis and hydroureter. Stranding around the right kidney and ureter. Left kidney and ureter are unremarkable. Bladder wall is not thickened and no bladder stones are demonstrated. Stomach/Bowel: Stomach is within normal limits. Appendix appears normal. No evidence of bowel wall thickening, distention, or inflammatory changes. Vascular/Lymphatic: No significant vascular findings are present. No enlarged abdominal or pelvic lymph nodes. Reproductive: Uterus and bilateral adnexa are unremarkable. Other: No abdominal wall hernia or abnormality. No abdominopelvic ascites. Musculoskeletal: Mild degenerative changes in the spine. No destructive bone lesions. IMPRESSION: 3 mm stone in the distal right ureter with moderate proximal obstruction. Probable sludge in the gallbladder without stone or wall thickening. Electronically Signed   By: Burman Nieves M.D.   On: 10/28/2017 01:36    Impression/Assessment:  Obstructing right distal ureteral calculus with sepsis.  Plan:  I recommended emergent placement of cystoscopy with placement of a right ureteral stent.  The procedure was discussed in detail including potential risks of bleeding and worsening sepsis.  The potential need for placement of a percutaneous nephrostomy tube was discussed if stent placement is unsuccessful.  She indicated all questions were answered and desires to proceed.  10/28/2017, 3:02 AM  Irineo Axon,  MD   Thank you for involving me in  this patient's care, I will continue to follow along.  Please page with any further questions or concerns. Lyriq Jarchow C Kattie Santoyo

## 2017-10-28 NOTE — Progress Notes (Signed)
Pharmacy Antibiotic Note  Amy Pennington is a 37 y.o. female admitted on 10/27/2017 with UTI/pyelonephritis s/t obstructed ureter.  Pharmacy has been consulted for ceftriaxone dosing.  Plan: Patient received ceftriaxone 1g IV x 2 in ED  Will continue ceftriaxone 2g IV daily  Height: 5\' 3"  (160 cm) Weight: 267 lb (121.1 kg) IBW/kg (Calculated) : 52.4  Temp (24hrs), Avg:98.9 F (37.2 C), Min:97.1 F (36.2 C), Max:102.3 F (39.1 C)  Recent Labs  Lab 10/27/17 2325 10/28/17 0203  WBC 7.2  --   CREATININE 0.76  --   LATICACIDVEN 0.7 0.7    Estimated Creatinine Clearance: 121.4 mL/min (by C-G formula based on SCr of 0.76 mg/dL).    No Known Allergies   Thank you for allowing pharmacy to be a part of this patient's care.  Thomasene Rippleavid Yarethzy Croak, PharmD, BCPS Clinical Pharmacist 10/28/2017

## 2017-10-28 NOTE — ED Notes (Signed)
Amy StanfordJenna RN informed MD York CeriseForbach that patient's BP is 91/51. No new orders at this time.

## 2017-10-28 NOTE — H&P (Signed)
[facility] [place] [facility]   PATIENT NAME: Amy Pennington    MR#:  [id number]  DATE OF BIRTH:  11-15-1980  DATE OF ADMISSION:  10/27/2017  PRIMARY CARE PHYSICIAN: Patient, No Pcp Per   REQUESTING/REFERRING PHYSICIAN:   CHIEF COMPLAINT:   Chief Complaint  Patient presents with  . Migraine  . Fever    HISTORY OF PRESENT ILLNESS: Amy Pennington  is a 37 y.o. female with a known history of bipolar disorder bronchial asthma, GERD, schizoaffective disorder presented to the emergency room with fever and right flank pain.  The right flank pain started 3 days ago it is sharp in nature 7 out of 10 on a scale of 1-10.  Patient also had fever and blood pressure was low when she presented to the emergency room.  She was given IV fluid boluses and was worked up in the emergency room.  CT kidney stone protocol showed 3 mm stone in the distal right ureter with obstruction.  Case was discussed with urology by ER physician who recommended surgical intervention for stone extraction and ureteral stent placement.  Hospitalist service was consulted for admission the patient.  No complaints of any chest pain, shortness of breath.  PAST MEDICAL HISTORY:   Past Medical History:  Diagnosis Date  . Asthma   . Bipolar 1 disorder (HCC)   . Depression   . GERD (gastroesophageal reflux disease)   . Migraines   . Schizo affective schizophrenia (HCC)     PAST SURGICAL HISTORY:  Past Surgical History:  Procedure Laterality Date  . none      SOCIAL HISTORY:  Social History   Tobacco Use  . Smoking status: Current Every Day Smoker    Packs/day: 1.00    Years: 15.00    Pack years: 15.00    Types: Cigarettes  . Smokeless tobacco: Never Used  . Tobacco comment: pt states that she is thinking about alternatives  Substance Use Topics  . Alcohol use: Yes    Alcohol/week: 0.6 oz    Types: 1 Cans of beer per week    FAMILY HISTORY:  Family History  Adopted: Yes  Problem  Relation Age of Onset  . Asthma Mother   . Depression Mother   . Diabetes Brother     DRUG ALLERGIES: No Known Allergies  REVIEW OF SYSTEMS:   CONSTITUTIONAL: Has fever,no fatigue or weakness.  EYES: No blurred or double vision.  EARS, NOSE, AND THROAT: No tinnitus or ear pain.  RESPIRATORY: No cough, shortness of breath, wheezing or hemoptysis.  CARDIOVASCULAR: No chest pain, orthopnea, edema.  GASTROINTESTINAL: No nausea, vomiting, diarrhea or abdominal pain.  Has right flank pain. GENITOURINARY: Has dysuria,no hematuria.  ENDOCRINE: No polyuria, nocturia,  HEMATOLOGY: No anemia, easy bruising or bleeding SKIN: No rash or lesion. MUSCULOSKELETAL: No joint pain or arthritis.   NEUROLOGIC: No tingling, numbness, weakness.  PSYCHIATRY: No anxiety or depression.   MEDICATIONS AT HOME:  Prior to Admission medications   Medication Sig Start Date End Date Taking? Authorizing Provider  albuterol (PROVENTIL HFA;VENTOLIN HFA) 108 (90 Base) MCG/ACT inhaler Inhale 2 puffs into the lungs every 6 (six) hours as needed for wheezing or shortness of breath. Patient not taking: Reported on 10/28/2017 09/12/16   Hower, Cletis Athens, MD  albuterol (PROVENTIL HFA;VENTOLIN HFA) 108 (90 Base) MCG/ACT inhaler Inhale 2 puffs into the lungs every 6 (six) hours as needed for wheezing or shortness of breath. Patient not taking: Reported on 10/28/2017 10/04/17  Joni Reining, PA-C  albuterol (PROVENTIL) (2.5 MG/3ML) 0.083% nebulizer solution Take 3 mLs (2.5 mg total) by nebulization every 6 (six) hours as needed for wheezing or shortness of breath. Patient not taking: Reported on 10/28/2017 09/12/16   Hower, Cletis Athens, MD  albuterol-ipratropium (COMBIVENT) 18-103 MCG/ACT inhaler Inhale 1 puff into the lungs every 4 (four) hours as needed for wheezing or shortness of breath. Patient not taking: Reported on 10/28/2017 09/12/16   Hower, Cletis Athens, MD  guaiFENesin-codeine 100-10 MG/5ML syrup Take 10 mLs by mouth every 4 (four)  hours as needed for cough. Patient not taking: Reported on 10/28/2017 09/12/16   Hower, Cletis Athens, MD  hydrocortisone valerate ointment (WESTCORT) 0.2 % Apply to affected area daily Patient not taking: Reported on 10/28/2017 10/04/17 10/04/18  Joni Reining, PA-C  methylPREDNISolone (MEDROL DOSEPAK) 4 MG TBPK tablet Take Tapered dose as directed Patient not taking: Reported on 10/28/2017 10/04/17   Joni Reining, PA-C  mometasone-formoterol (DULERA) 200-5 MCG/ACT AERO Inhale 2 puffs into the lungs 2 (two) times daily. Patient not taking: Reported on 10/28/2017 09/12/16   Hower, Cletis Athens, MD  nicotine (NICODERM CQ - DOSED IN MG/24 HOURS) 21 mg/24hr patch Place 1 patch (21 mg total) onto the skin daily. Patient not taking: Reported on 10/28/2017 09/13/16   Hower, Cletis Athens, MD  predniSONE (DELTASONE) 10 MG tablet 40mg  x1 day, 20mg  x2 day, 10mg  x2 day then stop Patient not taking: Reported on 10/28/2017 09/12/16   Hower, Cletis Athens, MD  traMADol (ULTRAM) 50 MG tablet Take 1 tablet (50 mg total) by mouth every 6 (six) hours as needed for moderate pain. Patient not taking: Reported on 10/28/2017 09/12/16   Hower, Cletis Athens, MD      PHYSICAL EXAMINATION:   VITAL SIGNS: Blood pressure (!) 89/48, pulse 77, temperature (!) 102.3 F (39.1 C), temperature source Oral, resp. rate 15, height 5\' 3"  (1.6 m), weight 121.1 kg (267 lb), last menstrual period 10/27/2017, SpO2 97 %.  GENERAL:  37 y.o.-year-old patient lying in the bed with no acute distress.  EYES: Pupils equal, round, reactive to light and accommodation. No scleral icterus. Extraocular muscles intact.  HEENT: Head atraumatic, normocephalic. Oropharynx dry and nasopharynx clear.  NECK:  Supple, no jugular venous distention. No thyroid enlargement, no tenderness.  LUNGS: Normal breath sounds bilaterally, no wheezing, rales,rhonchi or crepitation. No use of accessory muscles of respiration.  CARDIOVASCULAR: S1, S2 normal. No murmurs, rubs, or gallops.  ABDOMEN: Soft,  nontender, nondistended. Bowel sounds present. No organomegaly or mass.  Tenderness in the right flank. EXTREMITIES: No pedal edema, cyanosis, or clubbing.  NEUROLOGIC: Cranial nerves II through XII are intact. Muscle strength 5/5 in all extremities. Sensation intact. Gait not checked.  PSYCHIATRIC: The patient is alert and oriented x 3.  SKIN: No obvious rash, lesion, or ulcer.   LABORATORY PANEL:   CBC Recent Labs  Lab 10/27/17 2325  WBC 7.2  HGB 10.9*  HCT 33.9*  PLT 331  MCV 74.7*  MCH 23.9*  MCHC 32.1  RDW 18.7*  LYMPHSABS 1.8  MONOABS 0.9  EOSABS 0.1  BASOSABS 0.1   ------------------------------------------------------------------------------------------------------------------  Chemistries  Recent Labs  Lab 10/27/17 2325  NA 134*  K 3.2*  CL 101  CO2 23  GLUCOSE 104*  BUN 5*  CREATININE 0.76  CALCIUM 8.6*  AST 15  ALT 14  ALKPHOS 50  BILITOT 0.6   ------------------------------------------------------------------------------------------------------------------ estimated creatinine clearance is 121.4 mL/min (by C-G formula based on SCr of 0.76  mg/dL). ------------------------------------------------------------------------------------------------------------------ No results for input(s): TSH, T4TOTAL, T3FREE, THYROIDAB in the last 72 hours.  Invalid input(s): FREET3   Coagulation profile Recent Labs  Lab 10/27/17 2325  INR 1.05   ------------------------------------------------------------------------------------------------------------------- No results for input(s): DDIMER in the last 72 hours. -------------------------------------------------------------------------------------------------------------------  Cardiac Enzymes Recent Labs  Lab 10/27/17 2325  TROPONINI <0.03   ------------------------------------------------------------------------------------------------------------------ Invalid input(s):  POCBNP  ---------------------------------------------------------------------------------------------------------------  Urinalysis    Component Value Date/Time   COLORURINE AMBER (A) 10/27/2017 2335   APPEARANCEUR CLOUDY (A) 10/27/2017 2335   APPEARANCEUR Hazy 05/31/2013 2020   LABSPEC 1.014 10/27/2017 2335   LABSPEC 1.020 05/31/2013 2020   PHURINE 7.0 10/27/2017 2335   GLUCOSEU NEGATIVE 10/27/2017 2335   GLUCOSEU Negative 05/31/2013 2020   HGBUR SMALL (A) 10/27/2017 2335   BILIRUBINUR NEGATIVE 10/27/2017 2335   BILIRUBINUR Negative 05/31/2013 2020   KETONESUR NEGATIVE 10/27/2017 2335   PROTEINUR NEGATIVE 10/27/2017 2335   NITRITE NEGATIVE 10/27/2017 2335   LEUKOCYTESUR MODERATE (A) 10/27/2017 2335   LEUKOCYTESUR Negative 05/31/2013 2020     RADIOLOGY: Dg Chest Port 1 View  Result Date: 10/27/2017 CLINICAL DATA:  Chills and fever with body aches for 3 days. Frontal headache. EXAM: PORTABLE CHEST 1 VIEW COMPARISON:  Chest x-ray dated 10/04/2017. FINDINGS: The heart size and mediastinal contours are within normal limits. Both lungs are clear. The visualized skeletal structures are unremarkable. IMPRESSION: No active disease.  No evidence of pneumonia. Electronically Signed   By: Bary RichardStan  Maynard M.D.   On: 10/27/2017 23:27   Ct Renal Stone Study  Result Date: 10/28/2017 CLINICAL DATA:  Headache, abdominal pain, and cough for 2 days. Entire right side is aching. EXAM: CT ABDOMEN AND PELVIS WITHOUT CONTRAST TECHNIQUE: Multidetector CT imaging of the abdomen and pelvis was performed following the standard protocol without IV contrast. COMPARISON:  Ultrasound abdomen 05/20/2012 FINDINGS: Lower chest: Mild dependent atelectasis in the lung bases. Hepatobiliary: Increased density in the gallbladder likely representing layering sludge. No discrete stones identified. No wall thickening or inflammatory infiltration. No focal liver lesions. No bile duct dilatation. Pancreas: Unremarkable. No  pancreatic ductal dilatation or surrounding inflammatory changes. Spleen: Normal in size without focal abnormality. Adrenals/Urinary Tract: No adrenal gland nodules. 3 mm stone in the distal right ureter just above the ureterovesical junction. Moderate right hydronephrosis and hydroureter. Stranding around the right kidney and ureter. Left kidney and ureter are unremarkable. Bladder wall is not thickened and no bladder stones are demonstrated. Stomach/Bowel: Stomach is within normal limits. Appendix appears normal. No evidence of bowel wall thickening, distention, or inflammatory changes. Vascular/Lymphatic: No significant vascular findings are present. No enlarged abdominal or pelvic lymph nodes. Reproductive: Uterus and bilateral adnexa are unremarkable. Other: No abdominal wall hernia or abnormality. No abdominopelvic ascites. Musculoskeletal: Mild degenerative changes in the spine. No destructive bone lesions. IMPRESSION: 3 mm stone in the distal right ureter with moderate proximal obstruction. Probable sludge in the gallbladder without stone or wall thickening. Electronically Signed   By: Burman NievesWilliam  Stevens M.D.   On: 10/28/2017 01:36    EKG: Orders placed or performed during the hospital encounter of 10/27/17  . EKG 12-Lead  . EKG 12-Lead    IMPRESSION AND PLAN: 37 year old female patient with history of bipolar disorder GERD, migraine, schizoaffective schizophrenia presented to the emergency room with flank pain and fever.  Admitting diagnosis 1.  Obstructive uropathy 2.  Urosepsis 3.  Hypotension 4.  GERD 5.  Bipolar disorder Treatment plan Admit patient to stepdown unit Start patient on IV antibiotics IV Rocephin  1 g daily IV fluids Ureteral stent placement with stone extraction by urology Follow-up cultures  All the records are reviewed and case discussed with ED provider. Management plans discussed with the patient, family and they are in agreement.  CODE STATUS:FULL CODE Code  Status History    Date Active Date Inactive Code Status Order ID Comments User Context   09/11/2016 04:06 09/12/2016 19:30 Full Code [id number]  Arnaldo Natal, MD Inpatient       TOTAL CRITICAL CARE TIME TAKING CARE OF THIS PATIENT: 54 minutes.    Ihor Austin M.D on 10/28/2017 at 3:34 AM  Between 7am to 6pm - Pager - [phone]  After 6pm go to [email or web address] - password [email or web address]  [facility]  Office  [phone]  CC: Primary care physician; Patient, No Pcp Per   '

## 2017-10-28 NOTE — Progress Notes (Signed)
eLink Physician-Brief Progress Note Patient Name: Amy AmatoQuatius T Pennington DOB: June 28, 1981 MRN: [id number]   Date of Service  10/28/2017  HPI/Events of Note   37 y.o. patient with a 3-day history of right lower quadrant abdominal pain, fever, chills and nausea.  CT of the abdomen pelvis remarkable for 3 mm right distal ureteral calculus with proximal hydroureter.  She was taken to the OR for stent placement and returns to the SDU extubated.  On camera check she is on RA with sats of 97%, BP of 117/61 (81), HR 77 and RR of 18.  She is lying in bed in NAD.  eICU Interventions  Plan of care per primary admitting team and urology ABX  Pain control PCCM to see at bedside [facility]     Intervention Category Evaluation Type: New Patient Evaluation  Gabriell Daigneault 10/28/2017, 4:49 AM

## 2017-10-28 NOTE — Transfer of Care (Signed)
Immediate Anesthesia Transfer of Care Note  Patient: Amy Pennington  Procedure(s) Performed: CYSTOSCOPY WITH right ureteral STENT PLACEMENT (Right Ureter)  Patient Location: PACU  Anesthesia Type:General  Level of Consciousness: awake, alert  and oriented  Airway & Oxygen Therapy: Patient Spontanous Breathing and Patient connected to nasal cannula oxygen  Post-op Assessment: Report given to RN and Post -op Vital signs reviewed and stable  Post vital signs: Reviewed and stable  Last Vitals:  Vitals:   10/28/17 0138 10/28/17 0200  BP: (!) 91/51 (!) 89/48  Pulse: 76 77  Resp: 15   Temp:    SpO2: 96% 97%    Last Pain:  Vitals:   10/27/17 2331  TempSrc:   PainSc: 9          Complications: No apparent anesthesia complications

## 2017-10-28 NOTE — ED Notes (Signed)
RN to bedside to administer medication. Patient at CT.

## 2017-10-29 ENCOUNTER — Encounter: Payer: Self-pay | Admitting: Urology

## 2017-10-29 LAB — BASIC METABOLIC PANEL
Anion gap: 10 (ref 5–15)
BUN: 9 mg/dL (ref 6–20)
CHLORIDE: 107 mmol/L (ref 101–111)
CO2: 24 mmol/L (ref 22–32)
Calcium: 8.8 mg/dL — ABNORMAL LOW (ref 8.9–10.3)
Creatinine, Ser: 0.63 mg/dL (ref 0.44–1.00)
GFR calc Af Amer: >60 mL/min (ref >60)
GFR calc non Af Amer: >60 mL/min (ref >60)
GLUCOSE: 118 mg/dL — AB (ref 65–99)
POTASSIUM: 3.7 mmol/L (ref 3.5–5.1)
Sodium: 141 mmol/L (ref 135–145)

## 2017-10-29 LAB — CBC
HCT: 30.3 % — ABNORMAL LOW (ref 35.0–47.0)
HEMOGLOBIN: 9.7 g/dL — AB (ref 12.0–16.0)
MCH: 24 pg — ABNORMAL LOW (ref 26.0–34.0)
MCHC: 31.9 g/dL — ABNORMAL LOW (ref 32.0–36.0)
MCV: 75.1 fL — ABNORMAL LOW (ref 80.0–100.0)
PLATELETS: 319 10*3/uL (ref 150–440)
RBC: 4.03 MIL/uL (ref 3.80–5.20)
RDW: 18.7 % — ABNORMAL HIGH (ref 11.5–14.5)
WBC: 8.8 10*3/uL (ref 3.6–11.0)

## 2017-10-29 LAB — URINE CULTURE: CULTURE: NO GROWTH

## 2017-10-29 LAB — HIV ANTIBODY (ROUTINE TESTING W REFLEX): HIV SCREEN 4TH GENERATION: NONREACTIVE

## 2017-10-29 MED ORDER — CEPHALEXIN 500 MG PO CAPS: 500.0000 mg | ORAL_CAPSULE | Freq: Three times a day (TID) | ORAL | 0 refills | Status: AC

## 2017-10-29 MED ORDER — TAMSULOSIN HCL 0.4 MG PO CAPS: 0.4000 mg | ORAL_CAPSULE | Freq: Every day | ORAL | 0 refills | Status: DC

## 2017-10-29 MED ORDER — HYDROCODONE-ACETAMINOPHEN 5-325 MG PO TABS
2.0000 | ORAL_TABLET | Freq: Once | ORAL | Status: AC
  Administered 2017-10-29: 2 via ORAL

## 2017-10-29 MED ORDER — TAMSULOSIN HCL 0.4 MG PO CAPS
0.4000 mg | ORAL_CAPSULE | Freq: Every day | ORAL | Status: DC
  Filled 2017-10-29: qty 1

## 2017-10-29 MED ORDER — HYDROCODONE-ACETAMINOPHEN 5-325 MG PO TABS: 1.0000 | ORAL_TABLET | Freq: Four times a day (QID) | ORAL | 0 refills | Status: DC | PRN

## 2017-10-29 NOTE — Discharge Summary (Signed)
[employer] - [place] at [facility]   PATIENT NAME: Amy Pennington    MR#:  [id number]  DATE OF BIRTH:  Jun 18, 1981  DATE OF ADMISSION:  10/27/2017 ADMITTING PHYSICIAN: Ihor Austin, MD  DATE OF DISCHARGE: 10/29/2017  PRIMARY CARE PHYSICIAN: Patient, No Pcp Per    ADMISSION DIAGNOSIS:  Right ureteral stone [N20.1] Septic shock (HCC) [A41.9, R65.21] Urinary tract infection without hematuria, site unspecified [N39.0]  DISCHARGE DIAGNOSIS:  Active Problems:   Sepsis (HCC)   Septic shock (HCC)   SECONDARY DIAGNOSIS:   Past Medical History:  Diagnosis Date  . Asthma   . Bipolar 1 disorder (HCC)   . Depression   . GERD (gastroesophageal reflux disease)   . Migraines   . Schizo affective schizophrenia [facility])     HOSPITAL COURSE:   1.  Obstructive uropathy.  Patient had a CT scan that revealed a 3 mm stone in the distal right ureter with obstruction.  Urology did a procedure on 10/28/2017 which included cystoscopy, right ureteral stent placement.  Patient feeling much better on 10/29/2017 and wanted to go home.  A few pain pills written.  Blood cultures in 1 bottle has staph species which is likely a contaminant.  Urine culture growing E. coli.  I will give a few more days of Keflex since she was pretty sick when she came in.  She did receive IV Rocephin while here.  Flomax prescribed at night. 2.  Hypotension on presentation secondary to sepsis and obstructive uropathy.  Patient was given empiric antibiotics with Rocephin.  Blood pressure has improved.  Patient given a few more days of Keflex. 3.  History of bipolar disorder 4.  History of GERD 5.  Hypokalemia replaced during hospital course   DISCHARGE CONDITIONS:   Satisfactory  CONSULTS OBTAINED:  Treatment Team:  Riki Altes, MD  DRUG ALLERGIES:  No Known Allergies  DISCHARGE MEDICATIONS:   Allergies as of 10/29/2017   No Known Allergies     Medication List    STOP taking these medications    albuterol-ipratropium 18-103 MCG/ACT inhaler Commonly known as:  COMBIVENT   guaiFENesin-codeine 100-10 MG/5ML syrup   hydrocortisone valerate ointment 0.2 % Commonly known as:  WESTCORT   methylPREDNISolone 4 MG Tbpk tablet Commonly known as:  MEDROL DOSEPAK   mometasone-formoterol 200-5 MCG/ACT Aero Commonly known as:  DULERA   nicotine 21 mg/24hr patch Commonly known as:  NICODERM CQ - dosed in mg/24 hours   predniSONE 10 MG tablet Commonly known as:  DELTASONE   traMADol 50 MG tablet Commonly known as:  ULTRAM     TAKE these medications   albuterol 108 (90 Base) MCG/ACT inhaler Commonly known as:  PROVENTIL HFA;VENTOLIN HFA Inhale 2 puffs into the lungs every 6 (six) hours as needed for wheezing or shortness of breath. What changed:  Another medication with the same name was removed. Continue taking this medication, and follow the directions you see here.   cephALEXin 500 MG capsule Commonly known as:  KEFLEX Take 1 capsule (500 mg total) by mouth 3 (three) times daily for 3 days.   HYDROcodone-acetaminophen 5-325 MG tablet Commonly known as:  NORCO/VICODIN Take 1 tablet by mouth every 6 (six) hours as needed for moderate pain.   tamsulosin 0.4 MG Caps capsule Commonly known as:  FLOMAX Take 1 capsule (0.4 mg total) by mouth daily after supper.        DISCHARGE INSTRUCTIONS:   Call Dr. Lonna Cobb for an appointment this week Follow-up with  PMD 1 week  If you experience worsening of your admission symptoms, develop shortness of breath, life threatening emergency, suicidal or homicidal thoughts you must seek medical attention immediately by calling 911 or calling your MD immediately  if symptoms less severe.  You Must read complete instructions/literature along with all the possible adverse reactions/side effects for all the Medicines you take and that have been prescribed to you. Take any new Medicines after you have completely understood and accept all the  possible adverse reactions/side effects.   Please note  You were cared for by a hospitalist during your hospital stay. If you have any questions about your discharge medications or the care you received while you were in the hospital after you are discharged, you can call the unit and asked to speak with the hospitalist on call if the hospitalist that took care of you is not available. Once you are discharged, your primary care physician will handle any further medical issues. Please note that NO REFILLS for any discharge medications will be authorized once you are discharged, as it is imperative that you return to your primary care physician (or establish a relationship with a primary care physician if you do not have one) for your aftercare needs so that they can reassess your need for medications and monitor your lab values.    Today   CHIEF COMPLAINT:   Chief Complaint  Patient presents with  . Migraine  . Fever    HISTORY OF PRESENT ILLNESS:  [name]  is a 37 y.o. female presented with headache and fever.   VITAL SIGNS:  Blood pressure 124/71, pulse 64, temperature (!) 97.5 F (36.4 C), temperature source Oral, resp. rate 18, height 5\' 3"  (1.6 m), weight 121.1 kg (267 lb), last menstrual period 10/27/2017, SpO2 100 %.   PHYSICAL EXAMINATION:  GENERAL:  37 y.o.-year-old patient lying in the bed with no acute distress.  EYES: Pupils equal, round, reactive to light and accommodation. No scleral icterus. Extraocular muscles intact.  HEENT: Head atraumatic, normocephalic. Oropharynx and nasopharynx clear.  NECK:  Supple, no jugular venous distention. No thyroid enlargement, no tenderness.  LUNGS: Normal breath sounds bilaterally, no wheezing, rales,rhonchi or crepitation. No use of accessory muscles of respiration.  CARDIOVASCULAR: S1, S2 normal. No murmurs, rubs, or gallops.  ABDOMEN: Soft, non-tender, non-distended. Bowel sounds present. No organomegaly or mass.   EXTREMITIES: No pedal edema, cyanosis, or clubbing.  NEUROLOGIC: Cranial nerves II through XII are intact. Muscle strength 5/5 in all extremities. Sensation intact. Gait not checked.  PSYCHIATRIC: The patient is alert and oriented x 3.  SKIN: No obvious rash, lesion, or ulcer.   DATA REVIEW:   CBC Recent Labs  Lab 10/29/17 0608  WBC 8.8  HGB 9.7*  HCT 30.3*  PLT 319    Chemistries  Recent Labs  Lab 10/27/17 2325  10/29/17 0608  NA 134*   < > 141  K 3.2*   < > 3.7  CL 101   < > 107  CO2 23   < > 24  GLUCOSE 104*   < > 118*  BUN 5*   < > 9  CREATININE 0.76   < > 0.63  CALCIUM 8.6*   < > 8.8*  AST 15  --   --   ALT 14  --   --   ALKPHOS 50  --   --   BILITOT 0.6  --   --    < > = values in this interval  not displayed.    Cardiac Enzymes Recent Labs  Lab 10/27/17 2325  TROPONINI <0.03    Microbiology Results  Results for orders placed or performed during the hospital encounter of 10/27/17  Blood Culture (routine x 2)     Status: None (Preliminary result)   Collection Time: 10/27/17 11:25 PM  Result Value Ref Range Status   Specimen Description BLOOD RIGHT HAND  Final   Special Requests   Final    BOTTLES DRAWN AEROBIC AND ANAEROBIC Blood Culture results may not be optimal due to an excessive volume of blood received in culture bottles   Culture  Setup Time   Final    Organism ID to follow GRAM POSITIVE COCCI IN BOTH AEROBIC AND ANAEROBIC BOTTLES CRITICAL RESULT CALLED TO, READ BACK BY AND VERIFIED WITH: DAVID BESANTI AT 2328 10/28/17.PMH Performed at [facility], [address] [place], [place] [address]    Culture [facility]  Final   Report Status PENDING  Incomplete  Blood Culture (routine x 2)     Status: None (Preliminary result)   Collection Time: 10/27/17 11:25 PM  Result Value Ref Range Status   Specimen Description BLOOD RIGHT [facility]  Final   Special Requests   Final    BOTTLES DRAWN AEROBIC AND ANAEROBIC Blood Culture adequate  volume   Culture   Final    NO GROWTH 1 DAY Performed at [facility], [address] [place], [place] [address]    Report Status PENDING  Incomplete  Blood Culture ID Panel (Reflexed)     Status: Abnormal   Collection Time: 10/27/17 11:25 PM  Result Value Ref Range Status   Enterococcus species NOT DETECTED NOT DETECTED Final   Listeria monocytogenes NOT DETECTED NOT DETECTED Final   Staphylococcus species DETECTED (A) NOT DETECTED Final    Comment: Methicillin (oxacillin) susceptible coagulase negative staphylococcus. Possible blood culture contaminant (unless isolated from more than one blood culture draw or clinical case suggests pathogenicity). No antibiotic treatment is indicated for blood  culture contaminants. CRITICAL RESULT CALLED TO, READ BACK BY AND VERIFIED WITH: DAVID BESANTI AT 2328 10/28/17.PMH    Staphylococcus aureus NOT DETECTED NOT DETECTED Final   Methicillin resistance NOT DETECTED NOT DETECTED Final   Streptococcus species NOT DETECTED NOT DETECTED Final   Streptococcus agalactiae NOT DETECTED NOT DETECTED Final   Streptococcus pneumoniae NOT DETECTED NOT DETECTED Final   Streptococcus pyogenes NOT DETECTED NOT DETECTED Final   Acinetobacter baumannii NOT DETECTED NOT DETECTED Final   Enterobacteriaceae species NOT DETECTED NOT DETECTED Final   Enterobacter cloacae complex NOT DETECTED NOT DETECTED Final   Escherichia coli NOT DETECTED NOT DETECTED Final   Klebsiella oxytoca NOT DETECTED NOT DETECTED Final   Klebsiella pneumoniae NOT DETECTED NOT DETECTED Final   Proteus species NOT DETECTED NOT DETECTED Final   Serratia marcescens NOT DETECTED NOT DETECTED Final   Haemophilus influenzae NOT DETECTED NOT DETECTED Final   Neisseria meningitidis NOT DETECTED NOT DETECTED Final   Pseudomonas aeruginosa NOT DETECTED NOT DETECTED Final   Candida albicans NOT DETECTED NOT DETECTED Final   Candida glabrata NOT DETECTED NOT DETECTED Final   Candida krusei  NOT DETECTED NOT DETECTED Final   Candida parapsilosis NOT DETECTED NOT DETECTED Final   Candida tropicalis NOT DETECTED NOT DETECTED Final    Comment: Performed at [facility], [address] [place], [place] [address]  Urine culture     Status: Abnormal (Preliminary result)   Collection Time: 10/27/17 11:35 PM  Result Value Ref Range Status   Specimen Description   Final    URINE, RANDOM Performed at [facility], [address] [place], [place] [address]    Special Requests   Final    NONE Performed at [facility], [address] [place], [place] [address]    Culture (A)  Final    >=100,000 COLONIES/mL ESCHERICHIA COLI SUSCEPTIBILITIES TO FOLLOW Performed at [facility], [address], [place], [place] [address]    Report Status PENDING  Incomplete  Urine Culture     Status: None   Collection Time: 10/28/17  3:31 AM  Result Value Ref Range Status   Specimen Description   Final    KIDNEY RIGHT PELVIS Performed at [facility], [address], [place], [place] [address]    Special Requests   Final    NONE Performed at [facility], [address] [place], [place] [address]    Culture   Final    NO GROWTH Performed at [facility], [address], [place], [place] [address]    Report Status 10/29/2017 FINAL  Final  MRSA PCR Screening     Status: None   Collection Time: 10/28/17  4:43 AM  Result Value Ref Range Status   MRSA by PCR NEGATIVE NEGATIVE Final    Comment:        The GeneXpert MRSA Assay (FDA approved for NASAL specimens only), is one component of a comprehensive MRSA colonization surveillance program. It is not intended to diagnose MRSA infection nor to guide or monitor treatment for MRSA infections. Performed at [facility], [address] [place], [place] [address]     RADIOLOGY:  Dg Chest Port 1 View  Result Date: 10/27/2017 CLINICAL DATA:  Chills and fever with body  aches for 3 days. Frontal headache. EXAM: PORTABLE CHEST 1 VIEW COMPARISON:  Chest x-ray dated 10/04/2017. FINDINGS: The heart size and mediastinal contours are within normal limits. Both lungs are clear. The visualized skeletal structures are unremarkable. IMPRESSION: No active disease.  No evidence of pneumonia. Electronically Signed   By: Bary  M.D.   On: 10/27/2017 23:27   Ct Renal Stone Study  Result Date: 10/28/2017 CLINICAL DATA:  Headache, abdominal pain, and cough for 2 days. Entire right side is aching. EXAM: CT ABDOMEN AND PELVIS WITHOUT CONTRAST TECHNIQUE: Multidetector CT imaging of the abdomen and pelvis was performed following the standard protocol without IV contrast. COMPARISON:  Ultrasound abdomen 05/20/2012 FINDINGS: Lower chest: Mild dependent atelectasis in the lung bases. Hepatobiliary: Increased density in the gallbladder likely representing layering sludge. No discrete stones identified. No wall thickening or inflammatory infiltration. No focal liver lesions. No bile duct dilatation. Pancreas: Unremarkable. No pancreatic ductal dilatation or surrounding inflammatory changes. Spleen: Normal in size without focal abnormality. Adrenals/Urinary Tract: No adrenal gland nodules. 3 mm stone in the distal right ureter just above the ureterovesical junction. Moderate right hydronephrosis and hydroureter. Stranding around the right kidney and ureter. Left kidney and ureter are unremarkable. Bladder wall is not thickened and no bladder stones are demonstrated. Stomach/Bowel: Stomach is within normal limits. Appendix appears normal. No evidence of bowel wall thickening, distention, or inflammatory changes. Vascular/Lymphatic: No significant vascular findings are present. No enlarged abdominal or pelvic lymph nodes. Reproductive: Uterus and bilateral adnexa are unremarkable. Other: No abdominal wall hernia or abnormality. No abdominopelvic ascites. Musculoskeletal: Mild degenerative changes in  the spine. No destructive bone lesions. IMPRESSION: 3 mm stone  in the distal right ureter with moderate proximal obstruction. Probable sludge in the gallbladder without stone or wall thickening. Electronically Signed   By: Burman Nieves M.D.   On: 10/28/2017 01:36     Management plans discussed with the patient, family and they are in agreement.  CODE STATUS:     Code Status Orders  (From admission, onward)        Start     Ordered   10/28/17 0448  Full code  Continuous     10/28/17 0447    Code Status History    Date Active Date Inactive Code Status Order ID Comments User Context   09/11/2016 04:06 09/12/2016 19:30 Full Code [id number]  Arnaldo Natal, MD Inpatient      TOTAL TIME TAKING CARE OF THIS PATIENT: 35 minutes.    Alford Highland M.D on 10/29/2017 at 2:03 PM  Between 7am to 6pm - Pager - [phone]  After 6pm go to [email or web address] - password [email or web address] [facility]  [employer] [facility] Office  [phone]  CC: Primary care physician; Patient, No Pcp Per

## 2017-10-29 NOTE — Discharge Instructions (Signed)
Need to follow up with Dr Lonna CobbStoioff to take out uretal stent and follow up kidney stone Stain all urine and bring stone to Dr Lonna CobbStoioff

## 2017-10-29 NOTE — Progress Notes (Signed)
Pt c/o pain and is requesting medication. Night shift had administered 1 norco at 0702 and the pt can only receive 1 additional norco at this time. However, patient prefers two pills because if we give one pill she cannot be medicated for 4 more hours. MD at bedside whom gives this RN permission to give the 2 norco that are schedule and then resume the every four hour schedule from this time.

## 2017-10-29 NOTE — Progress Notes (Signed)
Patient ID: Amy Pennington, female   DOB: 1980/12/15, 37 y.o.   MRN: [id number] [employer] [facility] - [place] at [facility]        Amy Pennington was admitted to the Hospital on 10/27/2017 and Discharged  10/29/2017 and should be excused from work/school   for 9  days starting 10/27/2017 , may return to work/school without any restrictions.  Alford Highlandichard Kathrin Folden M.D on 10/29/2017,at 2:03 PM  [employer] - [place] [facility]    Office  [phone]

## 2017-10-29 NOTE — Progress Notes (Signed)
Amy Pennington to be D/C'd home per MD order.  Discussed prescriptions and follow up appointments with the patient. Prescriptions given to patient, medication list explained in detail. Pt verbalized understanding.  Allergies as of 10/29/2017   No Known Allergies     Medication List    STOP taking these medications   albuterol-ipratropium 18-103 MCG/ACT inhaler Commonly known as:  COMBIVENT   guaiFENesin-codeine 100-10 MG/5ML syrup   hydrocortisone valerate ointment 0.2 % Commonly known as:  WESTCORT   methylPREDNISolone 4 MG Tbpk tablet Commonly known as:  MEDROL DOSEPAK   mometasone-formoterol 200-5 MCG/ACT Aero Commonly known as:  DULERA   nicotine 21 mg/24hr patch Commonly known as:  NICODERM CQ - dosed in mg/24 hours   predniSONE 10 MG tablet Commonly known as:  DELTASONE   traMADol 50 MG tablet Commonly known as:  ULTRAM     TAKE these medications   albuterol 108 (90 Base) MCG/ACT inhaler Commonly known as:  PROVENTIL HFA;VENTOLIN HFA Inhale 2 puffs into the lungs every 6 (six) hours as needed for wheezing or shortness of breath. What changed:  Another medication with the same name was removed. Continue taking this medication, and follow the directions you see here.   cephALEXin 500 MG capsule Commonly known as:  KEFLEX Take 1 capsule (500 mg total) by mouth 3 (three) times daily for 3 days.   HYDROcodone-acetaminophen 5-325 MG tablet Commonly known as:  NORCO/VICODIN Take 1 tablet by mouth every 6 (six) hours as needed for moderate pain.   tamsulosin 0.4 MG Caps capsule Commonly known as:  FLOMAX Take 1 capsule (0.4 mg total) by mouth daily after supper.       Vitals:   10/29/17 0445 10/29/17 1234  BP: 124/70 124/71  Pulse: (!) 57 64  Resp: 20 18  Temp: (!) 97.4 F (36.3 C) (!) 97.5 F (36.4 C)  SpO2: 100% 100%    Skin clean, dry and intact without evidence of skin break down, no evidence of skin tears noted. IV catheter discontinued intact. Site  without signs and symptoms of complications. Dressing and pressure applied. Pt denies pain at this time. No complaints noted.  An After Visit Summary was printed and given to the patient. Patient escorted via WC, and D/C home via private auto.  Amy Pennington A

## 2017-10-30 LAB — URINE CULTURE

## 2017-10-31 ENCOUNTER — Telehealth: Payer: Self-pay | Admitting: Urology

## 2017-10-31 LAB — CULTURE, BLOOD (ROUTINE X 2)

## 2017-10-31 NOTE — Telephone Encounter (Signed)
Pt had cysto in OR last Friday 2/1.  She called today and said she needed a follow up with you this week.   I didn't see a discharge note about a follow up appt.  Please advise.

## 2017-11-01 NOTE — Telephone Encounter (Signed)
She had a stent placed for an obstructing stone.  Okay to schedule this week if I have open slots

## 2017-11-02 LAB — CULTURE, BLOOD (ROUTINE X 2)
Culture: NO GROWTH
SPECIAL REQUESTS: ADEQUATE

## 2017-11-04 ENCOUNTER — Encounter: Payer: Self-pay | Admitting: Urology

## 2017-11-04 ENCOUNTER — Ambulatory Visit (INDEPENDENT_AMBULATORY_CARE_PROVIDER_SITE_OTHER): Payer: Self-pay | Admitting: Urology

## 2017-11-04 ENCOUNTER — Other Ambulatory Visit: Payer: Self-pay | Admitting: Radiology

## 2017-11-04 DIAGNOSIS — N201 Calculus of ureter: Secondary | ICD-10-CM

## 2017-11-04 LAB — URINALYSIS, COMPLETE
Bilirubin, UA: NEGATIVE
GLUCOSE, UA: NEGATIVE
Ketones, UA: NEGATIVE
NITRITE UA: NEGATIVE
Specific Gravity, UA: 1.025 (ref 1.005–1.030)
Urobilinogen, Ur: 0.2 mg/dL (ref 0.2–1.0)
pH, UA: 5.5 (ref 5.0–7.5)

## 2017-11-04 LAB — MICROSCOPIC EXAMINATION

## 2017-11-04 MED ORDER — HYDROCODONE-ACETAMINOPHEN 5-325 MG PO TABS: 1.0000 | ORAL_TABLET | Freq: Four times a day (QID) | ORAL | 0 refills | Status: DC | PRN

## 2017-11-07 ENCOUNTER — Encounter: Payer: Self-pay | Admitting: Urology

## 2017-11-07 NOTE — Progress Notes (Signed)
11/04/2017 7:33 AM   Amy Pennington 25-Mar-1981 [id number]  Referring provider: No referring provider defined for this encounter.  Chief Complaint  Patient presents with  . Follow-up    post op    HPI: 37 year old female presented to the ED on 10/27/2017 with a 3-4-day history of right lower quadrant abdominal pain associated with fever, chills and malaise.  Urinalysis was nitrite positive with significant pyuria.  A CT scan showed a 3 mm right distal ureteral calculus with proximal hydronephrosis/hydroureter.  She underwent emergent placement of a right ureteral stent with clinical improvement.  Her urine culture did grow pansensitive E. Coli.  Her only complaint is intermittent right lower quadrant and right flank pain most likely related to her stent.  She has mild irritative voiding symptoms.  She denies fever or chills.   PMH: Past Medical History:  Diagnosis Date  . Asthma   . Bipolar 1 disorder (HCC)   . Depression   . GERD (gastroesophageal reflux disease)   . Migraines   . Schizo affective schizophrenia [facility](HCC)     Surgical History: Past Surgical History:  Procedure Laterality Date  . CYSTOSCOPY WITH STENT PLACEMENT Right 10/28/2017   Procedure: CYSTOSCOPY WITH right ureteral STENT PLACEMENT;  Surgeon: Riki AltesStoioff, Braxton Vantrease C, MD;  Location: [facility] ORS;  Service: Urology;  Laterality: Right;  . none      Home Medications:  Allergies as of 11/04/2017   No Known Allergies     Medication List        Accurate as of 11/04/17 11:59 PM. Always use your most recent med list.          cephALEXin 500 MG capsule Commonly known as:  KEFLEX Take 500 mg by mouth 4 (four) times daily.   HYDROcodone-acetaminophen 5-325 MG tablet Commonly known as:  NORCO/VICODIN Take 1 tablet by mouth every 6 (six) hours as needed for moderate pain.   tamsulosin 0.4 MG Caps capsule Commonly known as:  FLOMAX Take 1 capsule (0.4 mg total) by mouth daily after supper.       Allergies: No  Known Allergies  Family History: Family History  Adopted: Yes  Problem Relation Age of Onset  . Asthma Mother   . Depression Mother   . Diabetes Brother     Social History:  reports that she has been smoking cigarettes.  She has a 15.00 pack-year smoking history. she has never used smokeless tobacco. She reports that she drinks about 0.6 oz of alcohol per week. She reports that she uses drugs. Drugs: Cocaine and Marijuana. Frequency: 2.00 times per week.  ROS: UROLOGY Frequent Urination?: Yes Hard to postpone urination?: Yes Burning/pain with urination?: No Get up at night to urinate?: Yes Leakage of urine?: No Urine stream starts and stops?: No Trouble starting stream?: No Do you have to strain to urinate?: No Blood in urine?: No Urinary tract infection?: Yes Sexually transmitted disease?: No Injury to kidneys or bladder?: No Painful intercourse?: No Weak stream?: No Currently pregnant?: No Vaginal bleeding?: No Last menstrual period?: n  Gastrointestinal Nausea?: Yes Vomiting?: No Indigestion/heartburn?: Yes Diarrhea?: Yes Constipation?: No  Constitutional Fever: Yes Night sweats?: Yes Weight loss?: Yes Fatigue?: Yes  Skin Skin rash/lesions?: No Itching?: No  Eyes Blurred vision?: No Double vision?: No  Ears/Nose/Throat Sore throat?: No Sinus problems?: No  Hematologic/Lymphatic Swollen glands?: No Easy bruising?: No  Cardiovascular Leg swelling?: No Chest pain?: No  Respiratory Cough?: No Shortness of breath?: No  Endocrine Excessive thirst?: No  Musculoskeletal Back  pain?: No Joint pain?: No  Neurological Headaches?: Yes Dizziness?: No  Psychologic Depression?: No Anxiety?: No  Physical Exam: BP 113/81   Pulse (!) 108   Ht 5\' 3"  (1.6 m)   Wt 258 lb (117 kg)   LMP 10/27/2017 (Exact Date) Comment: neg. preg test  BMI 45.70 kg/m   Constitutional:  Alert and oriented, No acute distress. HEENT: [place] AT, moist mucus membranes.   Trachea midline, no masses. Cardiovascular: No clubbing, cyanosis, or edema.  CV RRR Respiratory: Normal respiratory effort, no increased work of breathing.  Lungs clear GI: Abdomen is soft, nontender, nondistended, no abdominal masses GU: No CVA tenderness. Skin: No rashes, bruises or suspicious lesions. Lymph: No cervical or inguinal adenopathy. Neurologic: Grossly intact, no focal deficits, moving all 4 extremities. Psychiatric: Normal mood and affect.  Laboratory Data: Lab Results  Component Value Date   WBC 8.8 10/29/2017   HGB 9.7 (L) 10/29/2017   HCT 30.3 (L) 10/29/2017   MCV 75.1 (L) 10/29/2017   PLT 319 10/29/2017    Lab Results  Component Value Date   CREATININE 0.63 10/29/2017    Lab Results  Component Value Date   HGBA1C 6.0 (H) 09/10/2016    Urinalysis Lab Results  Component Value Date   SPECGRAV 1.025 11/04/2017   PHUR 5.5 11/04/2017   COLORU Yellow 11/04/2017   APPEARANCEUR Cloudy (A) 11/04/2017   LEUKOCYTESUR 1+ (A) 11/04/2017   PROTEINUR 2+ (A) 11/04/2017   GLUCOSEU Negative 11/04/2017   KETONESU Negative 11/04/2017   RBCU 2+ (A) 11/04/2017   BILIRUBINUR Negative 11/04/2017   UUROB 0.2 11/04/2017   NITRITE Negative 11/04/2017    Lab Results  Component Value Date   LABMICR See below: 11/04/2017   WBCUA 6-10 (A) 11/04/2017   RBCUA 11-30 (A) 11/04/2017   LABEPIT 0-10 11/04/2017   MUCUS Present (A) 11/04/2017   BACTERIA Moderate (A) 11/04/2017    Pertinent Imaging:  Results for orders placed during the hospital encounter of 10/27/17  CT Renal Stone Study   Narrative CLINICAL DATA:  Headache, abdominal pain, and cough for 2 days. Entire right side is aching.  EXAM: CT ABDOMEN AND PELVIS WITHOUT CONTRAST  TECHNIQUE: Multidetector CT imaging of the abdomen and pelvis was performed following the standard protocol without IV contrast.  COMPARISON:  Ultrasound abdomen 05/20/2012  FINDINGS: Lower chest: Mild dependent atelectasis in the  lung bases.  Hepatobiliary: Increased density in the gallbladder likely representing layering sludge. No discrete stones identified. No wall thickening or inflammatory infiltration. No focal liver lesions. No bile duct dilatation.  Pancreas: Unremarkable. No pancreatic ductal dilatation or surrounding inflammatory changes.  Spleen: Normal in size without focal abnormality.  Adrenals/Urinary Tract: No adrenal gland nodules. 3 mm stone in the distal right ureter just above the ureterovesical junction. Moderate right hydronephrosis and hydroureter. Stranding around the right kidney and ureter. Left kidney and ureter are unremarkable. Bladder wall is not thickened and no bladder stones are demonstrated.  Stomach/Bowel: Stomach is within normal limits. Appendix appears normal. No evidence of bowel wall thickening, distention, or inflammatory changes.  Vascular/Lymphatic: No significant vascular findings are present. No enlarged abdominal or pelvic lymph nodes.  Reproductive: Uterus and bilateral adnexa are unremarkable.  Other: No abdominal wall hernia or abnormality. No abdominopelvic ascites.  Musculoskeletal: Mild degenerative changes in the spine. No destructive bone lesions.  IMPRESSION: 3 mm stone in the distal right ureter with moderate proximal obstruction. Probable sludge in the gallbladder without stone or wall thickening.   Electronically Signed  By: Burman Nieves M.D.   On: 10/28/2017 01:36     Assessment & Plan:  37 year old female status post right ureteral stent placement for an obstructing right distal ureteral calculus with infection.  I recommended scheduling cystoscopy with stent removal and right ureteroscopy with possible stone extraction.  The indications and nature of the planned procedure were discussed as well as the potential  benefits and expected outcome.  Alternatives have been discussed in detail. The most common complications and side  effects were discussed including but not limited to infection/sepsis; blood loss; damage to urethra, bladder, ureter; need for prolonged stent placement as well as general anesthesia risks.  All of her questions were answered and she desires to proceed.  At the time of our visit he she did not appear to be distracted or in pain.   1. Right ureteral stone  - Urinalysis, Complete - CULTURE, URINE COMPREHENSIVE    Riki Altes, MD  [facility] [address], [address] [place], [place] [address] [phone]

## 2017-11-08 LAB — CULTURE, URINE COMPREHENSIVE

## 2017-11-15 ENCOUNTER — Inpatient Hospital Stay: Admission: RE | Admit: 2017-11-15 | Payer: Self-pay | Source: Ambulatory Visit

## 2017-11-15 ENCOUNTER — Other Ambulatory Visit: Payer: Self-pay | Admitting: Radiology

## 2017-11-17 ENCOUNTER — Inpatient Hospital Stay: Admission: RE | Admit: 2017-11-17 | Payer: Self-pay | Source: Ambulatory Visit

## 2017-11-21 ENCOUNTER — Other Ambulatory Visit: Payer: Self-pay

## 2017-11-21 ENCOUNTER — Encounter
Admission: RE | Admit: 2017-11-21 | Discharge: 2017-11-21 | Disposition: A | Discharge status: Home / Self Care | Payer: MEDICAID | Source: Ambulatory Visit | Attending: Urology | Admitting: Urology

## 2017-11-21 NOTE — Patient Instructions (Signed)
Your procedure is scheduled on: Tuesday November 22, 2017 @ 0600 am Report to Same Day Surgery 2nd floor medical mall [facility](Medical Mall Entrance-take elevator on left to 2nd floor.  Check in with surgery information desk.)  Remember: Instructions that are not followed completely may result in serious medical risk, up to and including death, or upon the discretion of your surgeon and anesthesiologist your surgery may need to be rescheduled.    _x___ 1. Do not eat food after midnight the night before your procedure. You may drink clear liquids up to 2 hours before you are scheduled to arrive at the hospital for your procedure.  Do not drink clear liquids within 2 hours of your scheduled arrival to the hospital.  Clear liquids include  --Water or Apple juice without pulp  --Clear carbohydrate beverage such as Gatorade  --Black Coffee or Clear Tea (No milk, no creamers, do not add anything to the coffee or Tea No gum chewing or hard candies.     __x__ 2. No Alcohol for 24 hours before or after surgery.   __x__3. No Smoking or e-cigarettes for 24 prior to surgery.  Do not use any chewable tobacco products for at least 6 hour prior to surgery   ____  4. Bring all medications with you on the day of surgery if instructed.    __x__ 5. Notify your doctor if there is any change in your medical condition     (cold, fever, infections).   __x__6. On the morning of surgery brush your teeth with toothpaste and water.  You may rinse your mouth with mouth wash if you wish.  Do not swallow any toothpaste or mouthwash.   Do not wear jewelry, make-up, hairpins, clips or nail polish.  Do not wear lotions, powders, or perfumes. You may wear deodorant.  Do not shave 48 hours prior to surgery. Men may shave face and neck.  Do not bring valuables to the hospital.    [facility] is not responsible for any belongings or valuables.               Contacts, dentures or bridgework may not be worn into surgery.  Leave  your suitcase in the car. After surgery it may be brought to your room.  For patients admitted to the hospital, discharge time is determined by your                       treatment team.  _  Patients discharged the day of surgery will not be allowed to drive home.  You will need someone to drive you home and stay with you the night of your procedure.    Please read over the following fact sheets that you were given:   [facility] Preparing for Surgery and or MRSA Information   _x___ Take anti-hypertensive listed below, cardiac, seizure, asthma,     anti-reflux and psychiatric medicines. These include:  _x___ Use CHG Soap or sage wipes as directed on instruction sheet   _x___ Use inhalers on the day of surgery and bring to hospital day of surgery  _x___ Follow recommendations from Cardiologist, Pulmonologist or PCP regarding stopping Aspirin, Coumadin, Plavix ,Eliquis, Effient, or Pradaxa, and Pletal.  _x___Stop Anti-inflammatories such as Advil, Aleve, Ibuprofen, Motrin, Naproxen, Naprosyn, Goodies powders or aspirin products. OK to take Tylenol and Celebrex.   _x___ Stop supplements until after surgery.  But may continue Vitamin D, Vitamin B, and multivitamin.   ____ Bring C-Pap to  the hospital.

## 2017-11-22 ENCOUNTER — Ambulatory Visit
Admission: RE | Admit: 2017-11-22 | Discharge: 2017-11-22 | Disposition: A | Discharge status: Home / Self Care | Payer: Self-pay | Source: Ambulatory Visit | Attending: Urology | Admitting: Urology

## 2017-11-22 ENCOUNTER — Telehealth: Payer: Self-pay | Admitting: Radiology

## 2017-11-22 ENCOUNTER — Encounter
Admission: RE | Disposition: A | Discharge status: Home / Self Care | Payer: Self-pay | Source: Ambulatory Visit | Attending: Urology

## 2017-11-22 ENCOUNTER — Encounter: Payer: Self-pay | Admitting: *Deleted

## 2017-11-22 ENCOUNTER — Other Ambulatory Visit: Payer: Self-pay | Admitting: Radiology

## 2017-11-22 ENCOUNTER — Encounter: Payer: Self-pay | Admitting: Anesthesiology

## 2017-11-22 ENCOUNTER — Other Ambulatory Visit: Payer: Self-pay | Admitting: Urology

## 2017-11-22 DIAGNOSIS — N201 Calculus of ureter: Secondary | ICD-10-CM

## 2017-11-22 DIAGNOSIS — Z538 Procedure and treatment not carried out for other reasons: Secondary | ICD-10-CM | POA: Insufficient documentation

## 2017-11-22 LAB — PREGNANCY, URINE: Preg Test, Ur: NEGATIVE

## 2017-11-22 LAB — URINE DRUG SCREEN, QUALITATIVE (ARMC ONLY)
Amphetamines, Ur Screen: NOT DETECTED
BARBITURATES, UR SCREEN: NOT DETECTED
BENZODIAZEPINE, UR SCRN: NOT DETECTED
CANNABINOID 50 NG, UR ~~LOC~~: POSITIVE — AB
Cocaine Metabolite,Ur ~~LOC~~: POSITIVE — AB
MDMA (Ecstasy)Ur Screen: NOT DETECTED
Methadone Scn, Ur: NOT DETECTED
Opiate, Ur Screen: NOT DETECTED
PHENCYCLIDINE (PCP) UR S: NOT DETECTED
Tricyclic, Ur Screen: NOT DETECTED

## 2017-11-22 LAB — POCT PREGNANCY, URINE: Preg Test, Ur: NEGATIVE

## 2017-11-22 SURGERY — CYSTOSCOPY, WITH CALCULUS MANIPULATION OR REMOVAL
Anesthesia: Choice | Laterality: Right

## 2017-11-22 MED ORDER — ONDANSETRON HCL 4 MG/2ML IJ SOLN
INTRAMUSCULAR | Status: AC
  Filled 2017-11-22: qty 2

## 2017-11-22 MED ORDER — CEFAZOLIN SODIUM-DEXTROSE 2-4 GM/100ML-% IV SOLN: 2.0000 g | INTRAVENOUS | Status: DC

## 2017-11-22 MED ORDER — SUCCINYLCHOLINE CHLORIDE 20 MG/ML IJ SOLN
INTRAMUSCULAR | Status: AC
  Filled 2017-11-22: qty 1

## 2017-11-22 MED ORDER — LIDOCAINE HCL (PF) 2 % IJ SOLN
INTRAMUSCULAR | Status: AC
  Filled 2017-11-22: qty 10

## 2017-11-22 MED ORDER — FAMOTIDINE 20 MG PO TABS
ORAL_TABLET | ORAL | Status: AC
  Administered 2017-11-22: 20 mg via ORAL
  Filled 2017-11-22: qty 1

## 2017-11-22 MED ORDER — CEFAZOLIN SODIUM-DEXTROSE 2-4 GM/100ML-% IV SOLN
INTRAVENOUS | Status: AC
  Filled 2017-11-22: qty 100

## 2017-11-22 MED ORDER — FAMOTIDINE 20 MG PO TABS
20.0000 mg | ORAL_TABLET | Freq: Once | ORAL | Status: AC
  Administered 2017-11-22: 20 mg via ORAL

## 2017-11-22 MED ORDER — FENTANYL CITRATE (PF) 100 MCG/2ML IJ SOLN
INTRAMUSCULAR | Status: AC
  Filled 2017-11-22: qty 2

## 2017-11-22 MED ORDER — LACTATED RINGERS IV SOLN: INTRAVENOUS | Status: DC

## 2017-11-22 MED ORDER — MIDAZOLAM HCL 2 MG/2ML IJ SOLN
INTRAMUSCULAR | Status: AC
  Filled 2017-11-22: qty 2

## 2017-11-22 MED ORDER — DEXAMETHASONE SODIUM PHOSPHATE 10 MG/ML IJ SOLN
INTRAMUSCULAR | Status: AC
  Filled 2017-11-22: qty 1

## 2017-11-22 MED ORDER — HYDROCODONE-ACETAMINOPHEN 5-325 MG PO TABS: 1.0000 | ORAL_TABLET | Freq: Four times a day (QID) | ORAL | 0 refills | Status: DC | PRN

## 2017-11-22 MED ORDER — PROPOFOL 10 MG/ML IV BOLUS
INTRAVENOUS | Status: AC
  Filled 2017-11-22: qty 20

## 2017-11-22 SURGICAL SUPPLY — 28 items
BAG DRAIN CYSTO-URO LG1000N (MISCELLANEOUS) ×3 IMPLANT
BASKET ZERO TIP 1.9FR (BASKET) IMPLANT
BRUSH SCRUB EZ 1% IODOPHOR (MISCELLANEOUS) ×3 IMPLANT
CATH URETL 5X70 OPEN END (CATHETERS) ×3 IMPLANT
CNTNR SPEC 2.5X3XGRAD LEK (MISCELLANEOUS)
CONRAY 43 FOR UROLOGY 50M (MISCELLANEOUS) ×3 IMPLANT
CONT SPEC 4OZ STER OR WHT (MISCELLANEOUS)
CONTAINER SPEC 2.5X3XGRAD LEK (MISCELLANEOUS) IMPLANT
DRAPE UTILITY 15X26 TOWEL STRL (DRAPES) ×3 IMPLANT
FIBER LASER LITHO 273 (Laser) IMPLANT
GLOVE BIO SURGEON STRL SZ8 (GLOVE) ×3 IMPLANT
GOWN STRL REUS W/ TWL LRG LVL3 (GOWN DISPOSABLE) ×2 IMPLANT
GOWN STRL REUS W/TWL LRG LVL3 (GOWN DISPOSABLE) ×4
GUIDEWIRE GREEN .038 145CM (MISCELLANEOUS) IMPLANT
INFUSOR MANOMETER BAG 3000ML (MISCELLANEOUS) ×3 IMPLANT
INTRODUCER DILATOR DOUBLE (INTRODUCER) IMPLANT
KIT TURNOVER CYSTO (KITS) ×3 IMPLANT
PACK CYSTO AR (MISCELLANEOUS) ×3 IMPLANT
SENSORWIRE 0.038 NOT ANGLED (WIRE) ×6
SET CYSTO W/LG BORE CLAMP LF (SET/KITS/TRAYS/PACK) ×3 IMPLANT
SHEATH URETERAL 12FRX35CM (MISCELLANEOUS) IMPLANT
SOL .9 NS 3000ML IRR  AL (IV SOLUTION) ×2
SOL .9 NS 3000ML IRR UROMATIC (IV SOLUTION) ×1 IMPLANT
STENT URET 6FRX24 CONTOUR (STENTS) IMPLANT
STENT URET 6FRX26 CONTOUR (STENTS) IMPLANT
SURGILUBE 2OZ TUBE FLIPTOP (MISCELLANEOUS) ×3 IMPLANT
WATER STERILE IRR 1000ML POUR (IV SOLUTION) ×3 IMPLANT
WIRE SENSOR 0.038 NOT ANGLED (WIRE) ×2 IMPLANT

## 2017-11-22 NOTE — H&P (Signed)
Patient was scheduled for stent removal and ureteroscopy today however urine drug screen ordered by anesthesia was positive for cocaine.  Her surgery was canceled and will be rescheduled.  She has moderate stent symptoms and requested a refill of her pain medication.

## 2017-11-22 NOTE — OR Nursing (Signed)
Pt admits to using marijuana and cocaine 2-3 days ago.  Not sure of timing.  Informed that surgery would be cancelled if urine drug screen is positive for cocaine.  Also pt ate a small can of ravioli at 0100.

## 2017-11-22 NOTE — OR Nursing (Signed)
UDS positive for cocaine.  Dr Karlton LemonKarenz and Dr Lonna Cobbstoioff notified.  Case cancelled.

## 2017-11-22 NOTE — Telephone Encounter (Signed)
LM with mother that surgery has been rescheduled to 12/09/2017. Instructions given. Mother voices understanding.

## 2017-11-22 NOTE — OR Nursing (Signed)
Dr Lonna CobbStoioff in to see pt, case to be rescheduled.

## 2017-11-22 NOTE — Telephone Encounter (Signed)
-----   Message from Riki AltesScott C Stoioff, MD sent at 11/22/2017  9:46 AM EST ----- Case this morning was canceled because urine drug screen was positive for cocaine.  She will need to be rescheduled.

## 2017-12-08 ENCOUNTER — Other Ambulatory Visit: Payer: Self-pay | Admitting: Radiology

## 2017-12-08 DIAGNOSIS — N201 Calculus of ureter: Secondary | ICD-10-CM

## 2017-12-13 ENCOUNTER — Other Ambulatory Visit: Payer: Self-pay

## 2017-12-13 ENCOUNTER — Encounter: Payer: Self-pay | Admitting: Urology

## 2017-12-13 NOTE — Telephone Encounter (Signed)
Pt missed appt for ucx prior to surgery. Attempted to reach pt & mother but [facility] to [facility] either number d/t vm not set up.

## 2017-12-14 NOTE — Telephone Encounter (Signed)
Again attempted to call both pt & mother, no answer & no vm set up.

## 2017-12-15 ENCOUNTER — Encounter: Payer: Self-pay | Admitting: Radiology

## 2017-12-20 ENCOUNTER — Ambulatory Visit: Admission: RE | Admit: 2017-12-20 | Payer: Self-pay | Source: Ambulatory Visit | Admitting: Urology

## 2017-12-20 ENCOUNTER — Encounter: Admission: RE | Payer: Self-pay | Source: Ambulatory Visit

## 2017-12-20 SURGERY — CYSTOSCOPY, WITH CALCULUS MANIPULATION OR REMOVAL
Anesthesia: Choice | Laterality: Right

## 2017-12-21 ENCOUNTER — Ambulatory Visit: Payer: Self-pay | Admitting: Internal Medicine

## 2017-12-23 NOTE — Telephone Encounter (Signed)
No response after letter mailed on 12/15/2017 so again attempted to call both pt & mother, no answer & no vm set up.

## 2018-01-10 ENCOUNTER — Ambulatory Visit: Payer: Self-pay | Admitting: Urology

## 2018-01-12 ENCOUNTER — Ambulatory Visit: Payer: Self-pay | Admitting: Urology

## 2018-01-18 ENCOUNTER — Ambulatory Visit: Payer: Self-pay | Admitting: Urology

## 2018-03-07 ENCOUNTER — Telehealth: Payer: Self-pay

## 2018-03-07 NOTE — Telephone Encounter (Signed)
Pt called stating she still has her stent in from 10/2017. She was unable to receive her last surgery due to positive drug screen (Cocaine). She states she wants to get scheduled for her surgery, pt needs a urine and culture. She admits she is still actively using drugs and would not pass a drug test at this time. Pt states she is in a lot of pain. I attempted to reach pt on phone number [phone] but it forwards straight to vm. Left message for pt to call office. If pt is having uncontrollable pain, she needs to go to ED. Please call and schedule pt for her surgery, pt aware she must pass drug test to have surgery.

## 2018-03-08 ENCOUNTER — Other Ambulatory Visit: Payer: Self-pay

## 2018-03-08 ENCOUNTER — Emergency Department
Admission: EM | Admit: 2018-03-08 | Discharge: 2018-03-08 | Disposition: A | Discharge status: Home / Self Care | Payer: Self-pay | Attending: Emergency Medicine | Admitting: Emergency Medicine

## 2018-03-08 ENCOUNTER — Emergency Department: Payer: Self-pay

## 2018-03-08 ENCOUNTER — Other Ambulatory Visit: Payer: Self-pay | Admitting: Radiology

## 2018-03-08 DIAGNOSIS — N83292 Other ovarian cyst, left side: Secondary | ICD-10-CM | POA: Insufficient documentation

## 2018-03-08 DIAGNOSIS — Z79899 Other long term (current) drug therapy: Secondary | ICD-10-CM | POA: Insufficient documentation

## 2018-03-08 DIAGNOSIS — N760 Acute vaginitis: Secondary | ICD-10-CM | POA: Insufficient documentation

## 2018-03-08 DIAGNOSIS — F17210 Nicotine dependence, cigarettes, uncomplicated: Secondary | ICD-10-CM | POA: Insufficient documentation

## 2018-03-08 DIAGNOSIS — J45909 Unspecified asthma, uncomplicated: Secondary | ICD-10-CM | POA: Insufficient documentation

## 2018-03-08 DIAGNOSIS — Z960 Presence of urogenital implants: Secondary | ICD-10-CM

## 2018-03-08 DIAGNOSIS — R102 Pelvic and perineal pain: Secondary | ICD-10-CM

## 2018-03-08 DIAGNOSIS — T8384XA Pain from genitourinary prosthetic devices, implants and grafts, initial encounter: Secondary | ICD-10-CM

## 2018-03-08 DIAGNOSIS — N83202 Unspecified ovarian cyst, left side: Secondary | ICD-10-CM

## 2018-03-08 DIAGNOSIS — N201 Calculus of ureter: Secondary | ICD-10-CM

## 2018-03-08 LAB — URINALYSIS, COMPLETE (UACMP) WITH MICROSCOPIC
BILIRUBIN URINE: NEGATIVE
Glucose, UA: NEGATIVE mg/dL
Ketones, ur: NEGATIVE mg/dL
Nitrite: POSITIVE — AB
PH: 5 (ref 5.0–8.0)
Protein, ur: 100 mg/dL — AB
RBC / HPF: >50 RBC/hpf — ABNORMAL HIGH (ref 0–5)
Specific Gravity, Urine: 1.018 (ref 1.005–1.030)
WBC, UA: >50 WBC/hpf — ABNORMAL HIGH (ref 0–5)

## 2018-03-08 LAB — POC URINE PREG, ED: Preg Test, Ur: NEGATIVE

## 2018-03-08 LAB — URINE DRUG SCREEN, QUALITATIVE (ARMC ONLY)
Amphetamines, Ur Screen: NOT DETECTED
BARBITURATES, UR SCREEN: NOT DETECTED
Benzodiazepine, Ur Scrn: NOT DETECTED
CANNABINOID 50 NG, UR ~~LOC~~: NOT DETECTED
COCAINE METABOLITE, UR ~~LOC~~: POSITIVE — AB
MDMA (Ecstasy)Ur Screen: NOT DETECTED
Methadone Scn, Ur: NOT DETECTED
OPIATE, UR SCREEN: NOT DETECTED
Phencyclidine (PCP) Ur S: NOT DETECTED
Tricyclic, Ur Screen: NOT DETECTED

## 2018-03-08 LAB — COMPREHENSIVE METABOLIC PANEL
ALK PHOS: 58 U/L (ref 38–126)
ALT: 12 U/L — AB (ref 14–54)
AST: 18 U/L (ref 15–41)
Albumin: 3.9 g/dL (ref 3.5–5.0)
Anion gap: 7 (ref 5–15)
BILIRUBIN TOTAL: 0.3 mg/dL (ref 0.3–1.2)
BUN: 11 mg/dL (ref 6–20)
CALCIUM: 9.2 mg/dL (ref 8.9–10.3)
CHLORIDE: 106 mmol/L (ref 101–111)
CO2: 25 mmol/L (ref 22–32)
CREATININE: 0.79 mg/dL (ref 0.44–1.00)
Glucose, Bld: 132 mg/dL — ABNORMAL HIGH (ref 65–99)
Potassium: 4 mmol/L (ref 3.5–5.1)
Sodium: 138 mmol/L (ref 135–145)
Total Protein: 7.9 g/dL (ref 6.5–8.1)

## 2018-03-08 LAB — CBC
HCT: 35.1 % (ref 35.0–47.0)
Hemoglobin: 11.2 g/dL — ABNORMAL LOW (ref 12.0–16.0)
MCH: 22.8 pg — ABNORMAL LOW (ref 26.0–34.0)
MCHC: 31.8 g/dL — ABNORMAL LOW (ref 32.0–36.0)
MCV: 71.8 fL — AB (ref 80.0–100.0)
PLATELETS: 420 10*3/uL (ref 150–440)
RBC: 4.89 MIL/uL (ref 3.80–5.20)
RDW: 18.5 % — ABNORMAL HIGH (ref 11.5–14.5)
WBC: 5.4 10*3/uL (ref 3.6–11.0)

## 2018-03-08 LAB — WET PREP, GENITAL
SPERM: NONE SEEN
TRICH WET PREP: NONE SEEN
Yeast Wet Prep HPF POC: NONE SEEN

## 2018-03-08 LAB — CHLAMYDIA/NGC RT PCR (ARMC ONLY)
Chlamydia Tr: NOT DETECTED
N gonorrhoeae: NOT DETECTED

## 2018-03-08 LAB — LIPASE, BLOOD: Lipase: 29 U/L (ref 11–51)

## 2018-03-08 MED ORDER — SODIUM CHLORIDE 0.9 % IV BOLUS
1000.0000 mL | Freq: Once | INTRAVENOUS | Status: AC
  Administered 2018-03-08: 1000 mL via INTRAVENOUS

## 2018-03-08 MED ORDER — ONDANSETRON HCL 4 MG/2ML IJ SOLN
4.0000 mg | Freq: Once | INTRAMUSCULAR | Status: AC
  Administered 2018-03-08: 4 mg via INTRAVENOUS
  Filled 2018-03-08: qty 2

## 2018-03-08 MED ORDER — CEPHALEXIN 500 MG PO CAPS
ORAL_CAPSULE | ORAL | Status: AC
  Filled 2018-03-08: qty 1

## 2018-03-08 MED ORDER — MORPHINE SULFATE (PF) 4 MG/ML IV SOLN
4.0000 mg | Freq: Once | INTRAVENOUS | Status: AC
  Administered 2018-03-08: 4 mg via INTRAVENOUS
  Filled 2018-03-08: qty 1

## 2018-03-08 MED ORDER — METRONIDAZOLE 500 MG PO TABS
500.0000 mg | ORAL_TABLET | Freq: Once | ORAL | Status: AC
Start: 2018-03-08 — End: 2018-03-08
  Administered 2018-03-08: 500 mg via ORAL
  Filled 2018-03-08: qty 1

## 2018-03-08 MED ORDER — OXYCODONE-ACETAMINOPHEN 5-325 MG PO TABS
1.0000 | ORAL_TABLET | Freq: Once | ORAL | Status: AC
  Administered 2018-03-08: 1 via ORAL

## 2018-03-08 MED ORDER — CEPHALEXIN 500 MG PO CAPS: 500.0000 mg | ORAL_CAPSULE | Freq: Three times a day (TID) | ORAL | 0 refills | Status: AC

## 2018-03-08 MED ORDER — OXYCODONE-ACETAMINOPHEN 5-325 MG PO TABS: 1.0000 | ORAL_TABLET | ORAL | 0 refills | Status: DC | PRN

## 2018-03-08 MED ORDER — CEPHALEXIN 500 MG PO CAPS
500.0000 mg | ORAL_CAPSULE | Freq: Once | ORAL | Status: AC
  Administered 2018-03-08: 500 mg via ORAL

## 2018-03-08 MED ORDER — OXYCODONE-ACETAMINOPHEN 5-325 MG PO TABS
ORAL_TABLET | ORAL | Status: AC
  Filled 2018-03-08: qty 1

## 2018-03-08 MED ORDER — METRONIDAZOLE 500 MG PO TABS: 500.0000 mg | ORAL_TABLET | Freq: Two times a day (BID) | ORAL | 0 refills | Status: AC

## 2018-03-08 NOTE — Telephone Encounter (Signed)
LMOM to return call on patient's cell number. Spoke with patient's mother & asked for return call. Mother states patient is asleep & will call when she wakes up.

## 2018-03-08 NOTE — ED Triage Notes (Addendum)
Sharp intermittent pain X 1 week to vaginal area. Pt denies being pregnant. States that she had surgery for kidney stones in February, had stent placed that she never went back to have removed. Pt called urologist yesterday and referred to ER. Pt alert and oriented X4, active, cooperative, pt in NAD. RR even and unlabored, color WNL.    Pt used cocaine yesterday.

## 2018-03-08 NOTE — ED Provider Notes (Addendum)
[facility] Emergency Department Provider Note  ___________________________________________   First MD Initiated Contact with Patient 03/08/18 1723     (approximate)  I have reviewed the triage vital signs and the nursing notes.   HISTORY  Chief Complaint Vaginal Pain   HPI Amy Pennington is a 37 y.o. female with bipolar disorder as well as a right sided ureteral stent was presented to the emergency department with right lower pelvic and abdominal pain shooting into the vagina over the past several days.  Says that the pain can be sharp and last for several seconds.  Says that when she wiped she also notices some bloody mucus.  Denies any diarrhea.  Denies any nausea or vomiting.  Denies any burning with urination.  Says that she still is a stented after several months because she is unable to get it out because of persistent positive testing for cocaine on UDS.  Past Medical History:  Diagnosis Date  . Asthma   . Bipolar 1 disorder (HCC)   . Depression   . GERD (gastroesophageal reflux disease)   . Migraines   . Schizo affective schizophrenia [facility])     Patient Active Problem List   Diagnosis Date Noted  . Sepsis (HCC) 10/28/2017  . Septic shock (HCC)   . Asthma exacerbation 09/11/2016  . Breast abscess 03/19/2016  . Asthma 09/08/2013    Past Surgical History:  Procedure Laterality Date  . CYSTOSCOPY WITH STENT PLACEMENT Right 10/28/2017   Procedure: CYSTOSCOPY WITH right ureteral STENT PLACEMENT;  Surgeon: Riki Altes, MD;  Location: [facility] ORS;  Service: Urology;  Laterality: Right;  . none      Prior to Admission medications   Medication Sig Start Date End Date Taking? Authorizing Provider  albuterol (PROVENTIL HFA;VENTOLIN HFA) 108 (90 Base) MCG/ACT inhaler Inhale 1-2 puffs into the lungs every 6 (six) hours as needed for wheezing or shortness of breath.    [provider]  HYDROcodone-acetaminophen (NORCO/VICODIN) 5-325 MG  tablet Take 1 tablet by mouth every 6 (six) hours as needed for moderate pain. 11/22/17   Stoioff, Verna Czech, MD  IBUPROFEN IB PO Take by mouth as needed.    [provider]  Multiple Vitamins-Minerals (HAIR SKIN NAILS PO) Take 2 tablets by mouth daily.    [provider]  tamsulosin (FLOMAX) 0.4 MG CAPS capsule Take 1 capsule (0.4 mg total) by mouth daily after supper. Patient not taking: Reported on 11/10/2017 10/29/17   Alford Highland, MD    Allergies Patient has no known allergies.  Family History  Adopted: Yes  Problem Relation Age of Onset  . Asthma Mother   . Depression Mother   . Diabetes Brother     Social History Social History   Tobacco Use  . Smoking status: Current Every Day Smoker    Packs/day: 0.25    Years: 15.00    Pack years: 3.75    Types: Cigarettes  . Smokeless tobacco: Former Neurosurgeon    Types: Snuff    Quit date: 2005  . Tobacco comment: pt states that she is thinking about alternatives  Substance Use Topics  . Alcohol use: Yes    Alcohol/week: 0.6 oz    Types: 1 Cans of beer per week  . Drug use: Yes    Frequency: 2.0 times per week    Types: Cocaine, Marijuana    Comment: Cocaine and Marijuana once or twice a week    Review of Systems  Constitutional: No fever/chills Eyes:  No visual changes. ENT: No sore throat. Cardiovascular: Denies chest pain. Respiratory: Denies shortness of breath. Gastrointestinal: no nausea, no vomiting.  No diarrhea.  No constipation. Genitourinary: As above Musculoskeletal: Negative for back pain. Skin: Negative for rash. Neurological: Negative for headaches, focal weakness or numbness.   ____________________________________________   PHYSICAL EXAM:  VITAL SIGNS: ED Triage Vitals [03/08/18 1705]  Enc Vitals Group     BP (!) 146/92     Pulse Rate (!) 102     Resp 18     Temp 97.6 F (36.4 C)     Temp Source Oral     SpO2 100 %     Weight 250 lb (113.4 kg)     Height 5\' 3"  (1.6 m)      Head Circumference      Peak Flow      Pain Score 7     Pain Loc      Pain Edu?      Excl. in GC?     Constitutional: Alert and oriented. Well appearing and in no acute distress. Eyes: Conjunctivae are normal.  Head: Atraumatic. Nose: No congestion/rhinnorhea. Mouth/Throat: Mucous membranes are moist.  Neck: No stridor.   Cardiovascular: Normal rate, regular rhythm. Grossly normal heart sounds.  Good peripheral circulation. Respiratory: Normal respiratory effort.  No retractions. Lungs CTAB. Gastrointestinal: Soft with moderate tenderness to palpation across the lower abdomen. No distention. No CVA tenderness. genitourinary: No external lesions.  Speculum exam with a very small amount of white mucoid discharge.  Bimanual exam without CMT.  Minimal uterine tenderness to palpation without any adnexal tenderness nor masses Musculoskeletal: No lower extremity tenderness nor edema.  No joint effusions. Neurologic:  Normal speech and language. No gross focal neurologic deficits are appreciated. Skin:  Skin is warm, dry and intact. No rash noted. Psychiatric: Mood and affect are normal. Speech and behavior are normal.  ____________________________________________   LABS (all labs ordered are listed, but only abnormal results are displayed)  Labs Reviewed  WET PREP, GENITAL - Abnormal; Notable for the following components:      Result Value   Clue Cells Wet Prep HPF POC PRESENT (*)    WBC, Wet Prep HPF POC FEW (*)    All other components within normal limits  COMPREHENSIVE METABOLIC PANEL - Abnormal; Notable for the following components:   Glucose, Bld 132 (*)    ALT 12 (*)    All other components within normal limits  CBC - Abnormal; Notable for the following components:   Hemoglobin 11.2 (*)    MCV 71.8 (*)    MCH 22.8 (*)    MCHC 31.8 (*)    RDW 18.5 (*)    All other components within normal limits  URINALYSIS, COMPLETE (UACMP) WITH MICROSCOPIC - Abnormal; Notable for the  following components:   Color, Urine YELLOW (*)    APPearance CLOUDY (*)    Hgb urine dipstick LARGE (*)    Protein, ur 100 (*)    Nitrite POSITIVE (*)    Leukocytes, UA LARGE (*)    RBC / HPF >50 (*)    WBC, UA >50 (*)    Bacteria, UA MANY (*)    All other components within normal limits  URINE DRUG SCREEN, QUALITATIVE ([facility] ONLY) - Abnormal; Notable for the following components:   Cocaine Metabolite,Ur  POSITIVE (*)    All other components within normal limits  CHLAMYDIA/NGC RT PCR ([facility] ONLY)  URINE CULTURE  LIPASE, BLOOD  POC URINE PREG,  ED   ____________________________________________  EKG   ____________________________________________  RADIOLOGY  Right-sided stent in place.  Mild right hydronephrosis.  No calculi.  Left ovary intermediate attenuating cyst is new from previous.  Favored to represent a hemorrhagic cyst.  Short interval follow-up recommended.  Cyst is 3.7 cm.  Ultrasound of the pelvis with a 3.5 cm mildly, consistent left ovary.  Consistent with a hemorrhagic follicle.  No other acute abnormalities. ____________________________________________   PROCEDURES  Procedure(s) performed:   Procedures  Critical Care performed:   ____________________________________________   INITIAL IMPRESSION / ASSESSMENT AND PLAN / ED COURSE  Pertinent labs & imaging results that were available during my care of the patient were reviewed by me and considered in my medical decision making (see chart for details).  Differential diagnosis includes, but is not limited to, ovarian cyst, ovarian torsion, acute appendicitis, diverticulitis, urinary tract infection/pyelonephritis, endometriosis, bowel obstruction, colitis, renal colic, gastroenteritis, hernia, fibroids, endometriosis, pregnancy related pain including ectopic pregnancy, etc.  As part of my medical decision making, I reviewed the following data within the electronic MEDICAL RECORD NUMBER Notes from  prior ED visits  1002 on 03/08/2018  Discussed case with Dr. Bishop DublinLee Brandon.  Patient with positive cocaine in the urine.  Patient would need probably about 6 days to detox from cocaine before she will be able to go to the OR with anesthesia per Dr. Apolinar JunesBrandon.  Because of this, with urology and medicine find it impractical admit and refusing the admission.  At this time the patient is nontoxic.  No further complaints of pain.  Heart rate of 88.  Patient nontoxic in appearance.  I believe she is appropriate for discharge home at this time.  She is aware of the diagnosis of ovarian cyst as well as the pain from her stent and the bacterial vaginosis that was positive on her wet prep.  Patient will be discharged with Keflex.  Also with several dose of Percocet for pain control.  She knows to call the urology office for follow-up.  She is understanding of the diagnosis as well as treatment plan willing to comply. ____________________________________________   FINAL CLINICAL IMPRESSION(S) / ED DIAGNOSES  Final diagnoses:  Pelvic pain  Pelvic pain  Bacterial vaginosis.  Ovarian cyst.  Stent pain.    NEW MEDICATIONS STARTED DURING THIS VISIT:  New Prescriptions   No medications on file     Note:  This document was prepared using Dragon voice recognition software and may include unintentional dictation errors.     Schaevitz, Myra Rudeavid Matthew, MD 03/08/18 2204  Patient also aware that she will need to stop using cocaine in order to have the stent taken out.  We discussed the risks of keeping the stent taken in such as sepsis and further pain.  She is aware of these complications and says that she will try to stop smoking crack cocaine in order to have her stent removed.    Myrna BlazerSchaevitz, David Matthew, MD 03/08/18 [phone]

## 2018-03-08 NOTE — Progress Notes (Signed)
Case discussed with Dr. Pershing ProudSchaevitz and hospitalist service this evening re: management of this complex patient.      In summary, 37 yo F with R distal ureteral stone s/p emergent stent placement by Dr. Lonna CobbStoioff on 10/28/2017.  She was scheduled for surgery for definitive management of her stone which was cancelled due to acute cocaine abuse.  She has been using chronically thus unable to be rescheduled despite repeated attempts from our to reschedule this elective procedure as she refused to abstain from drugs.    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  No significant hydronephrosis on CT scan today which was personally reviewed (other than what would be expected from possible stent reflux).  At this point, there is no indication for emergent/ urgent surgical intervention and any surgery would be elective still at this point.    Plan to check UDS.  If positive, will discharge patient from ED with abx as precaution and continue to recommend drug cessation/ refer to substance abuse program and work to reschedule once cocaine free.    If negative, will plan for admission with intervention within the next 1-2 days to expedite stone/ stent removal.    Vanna ScotlandAshley Jarreau Callanan, MD

## 2018-03-09 NOTE — Telephone Encounter (Signed)
Per Dr Apolinar JunesBrandon, notified patient & mother that she will attempt to remove stent in the office. Appointment made. Patient & mother voice understanding.

## 2018-03-10 ENCOUNTER — Telehealth: Payer: Self-pay | Admitting: Radiology

## 2018-03-10 ENCOUNTER — Other Ambulatory Visit: Payer: Self-pay | Admitting: Radiology

## 2018-03-10 LAB — URINE CULTURE

## 2018-03-10 MED ORDER — TRAMADOL HCL 50 MG PO TABS: 50.0000 mg | ORAL_TABLET | Freq: Four times a day (QID) | ORAL | 0 refills | Status: DC | PRN

## 2018-03-10 NOTE — Telephone Encounter (Signed)
Patient requests pain medication to last until stent removal scheduled on 03/14/2018. She has enough from what was prescribed in the ER to last until 03/11/2018. Please advise.

## 2018-03-10 NOTE — Telephone Encounter (Signed)
Rx tramadol sent

## 2018-03-10 NOTE — Telephone Encounter (Signed)
Unable to reach patient by phone. Left message with mother that script was sent to pharmacy.

## 2018-03-14 ENCOUNTER — Telehealth: Payer: Self-pay | Admitting: Urology

## 2018-03-14 ENCOUNTER — Other Ambulatory Visit: Payer: Self-pay | Admitting: Urology

## 2018-03-14 NOTE — Telephone Encounter (Signed)
Patient was a no-show today for cystoscopy, stent removal despite her recent emergency room visit for stent pain.  At this point, we will be unable to prescribe further narcotics for stent pain until she presents to the office for cystoscopy, stent removal.  Please continue to contact the patient to encourage her to come to the office to remove the stent.  Notably, on recent CT scan, there was minimal encrustation of the stent and the stone was not identified.  As such, stent removal seems like the most appropriate intervention at this time given that she continues to abuse cocaine and thus cannot be booked for an elective outpatient procedure for ureteroscopy.  Amy ScotlandAshley Fenix Ruppe, MD

## 2018-03-15 ENCOUNTER — Encounter: Payer: Self-pay | Admitting: Radiology

## 2018-03-15 NOTE — Telephone Encounter (Signed)
Certified letter mailed to patient. Address confirmed with mother.

## 2018-07-03 ENCOUNTER — Emergency Department: Payer: Self-pay

## 2018-07-03 ENCOUNTER — Encounter: Payer: Self-pay | Admitting: Emergency Medicine

## 2018-07-03 ENCOUNTER — Emergency Department
Admission: EM | Admit: 2018-07-03 | Discharge: 2018-07-03 | Disposition: A | Discharge status: Home / Self Care | Payer: Self-pay | Attending: Emergency Medicine | Admitting: Emergency Medicine

## 2018-07-03 DIAGNOSIS — R102 Pelvic and perineal pain: Secondary | ICD-10-CM

## 2018-07-03 DIAGNOSIS — N390 Urinary tract infection, site not specified: Secondary | ICD-10-CM | POA: Insufficient documentation

## 2018-07-03 DIAGNOSIS — F17210 Nicotine dependence, cigarettes, uncomplicated: Secondary | ICD-10-CM | POA: Insufficient documentation

## 2018-07-03 DIAGNOSIS — J45909 Unspecified asthma, uncomplicated: Secondary | ICD-10-CM | POA: Insufficient documentation

## 2018-07-03 LAB — COMPREHENSIVE METABOLIC PANEL
ALK PHOS: 41 U/L (ref 38–126)
ALT: 10 U/L (ref 0–44)
AST: 15 U/L (ref 15–41)
Albumin: 3.4 g/dL — ABNORMAL LOW (ref 3.5–5.0)
Anion gap: 7 (ref 5–15)
BUN: 11 mg/dL (ref 6–20)
CALCIUM: 8.6 mg/dL — AB (ref 8.9–10.3)
CHLORIDE: 106 mmol/L (ref 98–111)
CO2: 25 mmol/L (ref 22–32)
CREATININE: 0.76 mg/dL (ref 0.44–1.00)
GFR calc non Af Amer: >60 mL/min (ref >60)
Glucose, Bld: 110 mg/dL — ABNORMAL HIGH (ref 70–99)
Potassium: 3.5 mmol/L (ref 3.5–5.1)
SODIUM: 138 mmol/L (ref 135–145)
Total Bilirubin: 0.2 mg/dL — ABNORMAL LOW (ref 0.3–1.2)
Total Protein: 6.3 g/dL — ABNORMAL LOW (ref 6.5–8.1)

## 2018-07-03 LAB — URINALYSIS, COMPLETE (UACMP) WITH MICROSCOPIC
Bilirubin Urine: NEGATIVE
GLUCOSE, UA: NEGATIVE mg/dL
KETONES UR: NEGATIVE mg/dL
NITRITE: POSITIVE — AB
PROTEIN: 100 mg/dL — AB
Specific Gravity, Urine: 1.018 (ref 1.005–1.030)
pH: 6 (ref 5.0–8.0)

## 2018-07-03 LAB — CBC
HCT: 33.8 % — ABNORMAL LOW (ref 35.0–47.0)
HEMOGLOBIN: 10.3 g/dL — AB (ref 12.0–16.0)
MCH: 22.2 pg — AB (ref 26.0–34.0)
MCHC: 30.6 g/dL — ABNORMAL LOW (ref 32.0–36.0)
MCV: 72.5 fL — ABNORMAL LOW (ref 80.0–100.0)
Platelets: 486 10*3/uL — ABNORMAL HIGH (ref 150–440)
RBC: 4.66 MIL/uL (ref 3.80–5.20)
RDW: 20 % — ABNORMAL HIGH (ref 11.5–14.5)
WBC: 7.6 10*3/uL (ref 3.6–11.0)

## 2018-07-03 LAB — PREGNANCY, URINE: Preg Test, Ur: NEGATIVE

## 2018-07-03 LAB — LIPASE, BLOOD: LIPASE: 32 U/L (ref 11–51)

## 2018-07-03 MED ORDER — PHENAZOPYRIDINE HCL 200 MG PO TABS: 200.0000 mg | ORAL_TABLET | Freq: Three times a day (TID) | ORAL | 0 refills | Status: DC | PRN

## 2018-07-03 MED ORDER — KETOROLAC TROMETHAMINE 60 MG/2ML IM SOLN
60.0000 mg | Freq: Once | INTRAMUSCULAR | Status: AC
  Administered 2018-07-03: 60 mg via INTRAMUSCULAR
  Filled 2018-07-03: qty 2

## 2018-07-03 MED ORDER — CEPHALEXIN 500 MG PO CAPS: 500.0000 mg | ORAL_CAPSULE | Freq: Three times a day (TID) | ORAL | 0 refills | Status: DC

## 2018-07-03 NOTE — Discharge Instructions (Addendum)
Please seek medical attention for any high fevers, chest pain, shortness of breath, change in behavior, persistent vomiting, bloody stool or any other new or concerning symptoms.  

## 2018-07-03 NOTE — ED Triage Notes (Signed)
Pt reports had a stent placed in her bladder in January and she was supposed to have had it removed but she did not and now she has pain in her pubic/pelvic area and is urinating blood.

## 2018-07-03 NOTE — ED Provider Notes (Signed)
[facility] Emergency Department Provider Note   ____________________________________________   I have reviewed the triage vital signs and the nursing notes.   HISTORY  Chief Complaint Urinary Frequency and Abdominal Pain   History limited by: Not Limited   HPI Amy Pennington is a 37 y.o. female who presents to the emergency department today because of concerns for pelvic pain in hematuria.  The patient states that she had a ureteral stent placed in January.  This was placed in the right ureter secondary to a ureteral calculus.  Patient has not yet had it removed.  Patient states that she has been having pain on and off.  It has been worse recently.  She has not tried contacting her urologist.  She states with this pain she is now noticed some hematuria.  She is also unsure if she is having her period.  She denies any other abnormal vaginal discharge.  States that she is concerned that she might also have a ovarian cyst again given that she was found to have one in her previous emergency department visit.  Per medical record review patient has a history of right ureteral stent.  Emergency department visit roughly 4 months ago for similar symptoms.  Did not show to her follow-up urology appointment.  Past Medical History:  Diagnosis Date  . Asthma   . Bipolar 1 disorder (HCC)   . Depression   . GERD (gastroesophageal reflux disease)   . Migraines   . Schizo affective schizophrenia [facility])     Patient Active Problem List   Diagnosis Date Noted  . Sepsis (HCC) 10/28/2017  . Septic shock (HCC)   . Asthma exacerbation 09/11/2016  . Breast abscess 03/19/2016  . Asthma 09/08/2013    Past Surgical History:  Procedure Laterality Date  . CYSTOSCOPY WITH STENT PLACEMENT Right 10/28/2017   Procedure: CYSTOSCOPY WITH right ureteral STENT PLACEMENT;  Surgeon: Riki Altes, MD;  Location: [facility] ORS;  Service: Urology;  Laterality: Right;  . none      Prior to  Admission medications   Medication Sig Start Date End Date Taking? Authorizing Provider  albuterol (PROVENTIL HFA;VENTOLIN HFA) 108 (90 Base) MCG/ACT inhaler Inhale 1-2 puffs into the lungs every 6 (six) hours as needed for wheezing or shortness of breath.    [provider]  HYDROcodone-acetaminophen (NORCO/VICODIN) 5-325 MG tablet Take 1 tablet by mouth every 6 (six) hours as needed for moderate pain. 11/22/17   Stoioff, Verna Czech, MD  IBUPROFEN IB PO Take by mouth as needed.    [provider]  Multiple Vitamins-Minerals (HAIR SKIN NAILS PO) Take 2 tablets by mouth daily.    [provider]  oxyCODONE-acetaminophen (PERCOCET) 5-325 MG tablet Take 1 tablet by mouth every 4 (four) hours as needed for moderate pain or severe pain. 03/08/18 03/08/19  Myrna Blazer, MD  tamsulosin (FLOMAX) 0.4 MG CAPS capsule Take 1 capsule (0.4 mg total) by mouth daily after supper. Patient not taking: Reported on 11/10/2017 10/29/17   Alford Highland, MD  traMADol (ULTRAM) 50 MG tablet Take 1 tablet (50 mg total) by mouth every 6 (six) hours as needed for moderate pain. 03/10/18   Stoioff, Verna Czech, MD    Allergies Patient has no known allergies.  Family History  Adopted: Yes  Problem Relation Age of Onset  . Asthma Mother   . Depression Mother   . Diabetes Brother     Social History Social History   Tobacco Use  . Smoking  status: Current Every Day Smoker    Packs/day: 0.25    Years: 15.00    Pack years: 3.75    Types: Cigarettes  . Smokeless tobacco: Former Neurosurgeon    Types: Snuff    Quit date: 2005  . Tobacco comment: pt states that she is thinking about alternatives  Substance Use Topics  . Alcohol use: Yes    Alcohol/week: 1.0 standard drinks    Types: 1 Cans of beer per week  . Drug use: Yes    Frequency: 2.0 times per week    Types: Cocaine, Marijuana    Comment: Cocaine and Marijuana once or twice a week    Review of Systems Constitutional: No  fever/chills Eyes: No visual changes. ENT: No sore throat. Cardiovascular: Denies chest pain. Respiratory: Denies shortness of breath. Gastrointestinal: Positive for pelvic pain Genitourinary: Negative for dysuria.  Positive for hematuria  musculoskeletal: Negative for back pain. Skin: Negative for rash. Neurological: Negative for headaches, focal weakness or numbness.  ____________________________________________   PHYSICAL EXAM:  VITAL SIGNS: ED Triage Vitals  Enc Vitals Group     BP --      Pulse Rate 07/03/18 1233 96     Resp 07/03/18 1233 20     Temp 07/03/18 1233 98.2 F (36.8 C)     Temp Source 07/03/18 1233 Oral     SpO2 07/03/18 1233 96 %     Weight 07/03/18 1225 267 lb (121.1 kg)     Height 07/03/18 1225 5\' 3"  (1.6 m)     Head Circumference --      Peak Flow --      Pain Score 07/03/18 1225 7   Constitutional: Alert and oriented.  Eyes: Conjunctivae are normal.  ENT      Head: Normocephalic and atraumatic.      Nose: No congestion/rhinnorhea.      Mouth/Throat: Mucous membranes are moist.      Neck: No stridor. Hematological/Lymphatic/Immunilogical: No cervical lymphadenopathy. Cardiovascular: Normal rate, regular rhythm.  No murmurs, rubs, or gallops.  Respiratory: Normal respiratory effort without tachypnea nor retractions. Breath sounds are clear and equal bilaterally. No wheezes/rales/rhonchi. Gastrointestinal: Soft and tender to palpation in the suprapubic and right lower quadrant. No rebound. No guarding.  Genitourinary: Deferred Musculoskeletal: Normal range of motion in all extremities. No lower extremity edema. Neurologic:  Normal speech and language. No gross focal neurologic deficits are appreciated.  Skin:  Skin is warm, dry and intact. No rash noted. Psychiatric: Mood and affect are normal. Speech and behavior are normal. Patient exhibits appropriate insight and judgment.  ____________________________________________    LABS (pertinent  positives/negatives)  Lipase 32 CBC wbc 7.6, hgb 10.3, plt 486 CMP na 138, k 3.5, glu 110, cr 0.76 UA Large, hgb dipstick, nitrite positive, large leukocytes, >50 wbc, >50 rbc, few bacteria ____________________________________________   EKG  None  ____________________________________________    RADIOLOGY  [place]  Normal [place]   ____________________________________________   PROCEDURES  Procedures  ____________________________________________   INITIAL IMPRESSION / ASSESSMENT AND PLAN / ED COURSE  Pertinent labs & imaging results that were available during my care of the patient were reviewed by me and considered in my medical decision making (see chart for details).   Patient presented to the emergency department today because of concerns for pelvic pain.  Patient has a stent in her right ureter.  Patient's urine is concerning for infection today.  Ultrasound did not show any concerning ovarian issue or free fluid in the pelvis.  Discussed  with patient importance of urology follow-up.  Will start patient on antibiotics.  ____________________________________________   FINAL CLINICAL IMPRESSION(S) / ED DIAGNOSES  Final diagnoses:  Pelvic pain  Lower urinary tract infectious disease     Note: This dictation was prepared with Dragon dictation. Any transcriptional errors that result from this process are unintentional     Phineas Semen, MD 07/03/18 [phone]

## 2018-07-03 NOTE — ED Notes (Signed)
Pt discharged home after verbalizing understanding of discharge instructions; nad noted. 

## 2018-07-12 ENCOUNTER — Ambulatory Visit: Payer: Self-pay | Admitting: Ophthalmology

## 2018-07-17 ENCOUNTER — Ambulatory Visit
Admission: RE | Admit: 2018-07-17 | Discharge: 2018-07-17 | Disposition: A | Discharge status: Home / Self Care | Payer: Self-pay | Source: Ambulatory Visit | Attending: Urology | Admitting: Urology

## 2018-07-17 ENCOUNTER — Ambulatory Visit (INDEPENDENT_AMBULATORY_CARE_PROVIDER_SITE_OTHER): Payer: Self-pay | Admitting: Urology

## 2018-07-17 ENCOUNTER — Encounter: Payer: Self-pay | Admitting: Urology

## 2018-07-17 VITALS — BP 143/96 | HR 92 | Ht 63.0 in | Wt 268.8 lb

## 2018-07-17 DIAGNOSIS — N201 Calculus of ureter: Secondary | ICD-10-CM | POA: Insufficient documentation

## 2018-07-17 DIAGNOSIS — Z960 Presence of urogenital implants: Secondary | ICD-10-CM

## 2018-07-17 LAB — URINALYSIS, COMPLETE
Bilirubin, UA: NEGATIVE
Glucose, UA: NEGATIVE
Ketones, UA: NEGATIVE
NITRITE UA: NEGATIVE
Specific Gravity, UA: 1.02 (ref 1.005–1.030)
Urobilinogen, Ur: 0.2 mg/dL (ref 0.2–1.0)
pH, UA: 7 (ref 5.0–7.5)

## 2018-07-17 LAB — MICROSCOPIC EXAMINATION: RBC, UA: >30 /hpf — ABNORMAL HIGH (ref 0–2)

## 2018-07-17 NOTE — Addendum Note (Signed)
Addended by: Frankey Shown on: 07/17/2018 09:24 AM   Modules accepted: Orders

## 2018-07-17 NOTE — Progress Notes (Signed)
07/17/2018 8:42 AM   Amy Pennington 1980-10-14 [id number]  Referring provider: No referring provider defined for this encounter.  Chief Complaint  Patient presents with  . Hospitalization Follow-up    HPI: 37 year old female who had a right ureteral stent placed on 10/28/2017 secondary to a 3 mm obstructing distal calculus with sepsis.  She was scheduled several times for definitive stone treatment however urine drug screen was positive for cocaine.  It was elected to attempt to remove her stent in the office in June 2019 however she did not show for that appointment.  She states she has not followed up regarding the stent because she was "scared".  She was seen in the ED on 07/03/2018 complaining of pelvic pain and hematuria.  She denies fever or chills.   PMH: Past Medical History:  Diagnosis Date  . Asthma   . Bipolar 1 disorder (HCC)   . Depression   . GERD (gastroesophageal reflux disease)   . Migraines   . Schizo affective schizophrenia [facility])     Surgical History: Past Surgical History:  Procedure Laterality Date  . CYSTOSCOPY WITH STENT PLACEMENT Right 10/28/2017   Procedure: CYSTOSCOPY WITH right ureteral STENT PLACEMENT;  Surgeon: Riki Altes, MD;  Location: [facility] ORS;  Service: Urology;  Laterality: Right;  . none      Home Medications:  Allergies as of 07/17/2018   No Known Allergies     Medication List        Accurate as of 07/17/18  8:42 AM. Always use your most recent med list.          albuterol 108 (90 Base) MCG/ACT inhaler Commonly known as:  PROVENTIL HFA;VENTOLIN HFA Inhale 1-2 puffs into the lungs every 6 (six) hours as needed for wheezing or shortness of breath.   cephALEXin 500 MG capsule Commonly known as:  KEFLEX Take 1 capsule (500 mg total) by mouth 3 (three) times daily.   HAIR SKIN NAILS PO Take 2 tablets by mouth daily.   IBUPROFEN IB PO Take by mouth as needed.   phenazopyridine 200 MG tablet Commonly known as:   PYRIDIUM Take 1 tablet (200 mg total) by mouth 3 (three) times daily as needed for pain.   tamsulosin 0.4 MG Caps capsule Commonly known as:  FLOMAX Take 1 capsule (0.4 mg total) by mouth daily after supper.   traMADol 50 MG tablet Commonly known as:  ULTRAM Take 1 tablet (50 mg total) by mouth every 6 (six) hours as needed for moderate pain.       Allergies: No Known Allergies  Family History: Family History  Adopted: Yes  Problem Relation Age of Onset  . Asthma Mother   . Depression Mother   . Diabetes Brother     Social History:  reports that she has been smoking cigarettes. She has a 3.75 pack-year smoking history. She quit smokeless tobacco use about 14 years ago.  Her smokeless tobacco use included snuff. She reports that she drinks about 1.0 standard drinks of alcohol per week. She reports that she has current or past drug history. Drugs: Cocaine and Marijuana. Frequency: 2.00 times per week.  ROS: UROLOGY Frequent Urination?: Yes Hard to postpone urination?: No Burning/pain with urination?: Yes Get up at night to urinate?: Yes Leakage of urine?: No Urine stream starts and stops?: No Trouble starting stream?: No Do you have to strain to urinate?: No Blood in urine?: No Urinary tract infection?: No Sexually transmitted disease?: No Injury to kidneys or  bladder?: No Painful intercourse?: No Weak stream?: No Currently pregnant?: No Vaginal bleeding?: Yes Last menstrual period?: Unsure  Gastrointestinal Nausea?: No Vomiting?: No Indigestion/heartburn?: No Diarrhea?: No Constipation?: No  Constitutional Fever: No Night sweats?: No Weight loss?: No Fatigue?: No  Skin Skin rash/lesions?: No Itching?: No  Eyes Blurred vision?: No Double vision?: No  Ears/Nose/Throat Sore throat?: No Sinus problems?: No  Hematologic/Lymphatic Swollen glands?: No Easy bruising?: No  Cardiovascular Leg swelling?: No Chest pain?: No  Respiratory Cough?:  No Shortness of breath?: No  Endocrine Excessive thirst?: No  Musculoskeletal Back pain?: No Joint pain?: No  Neurological Headaches?: No Dizziness?: No  Psychologic Depression?: No Anxiety?: No  Physical Exam: BP (!) 143/96 (BP Location: Left Arm, Patient Position: Sitting, Cuff Size: Large)   Pulse 92   Ht 5\' 3"  (1.6 m)   Wt 268 lb 12.8 oz (121.9 kg)   BMI 47.62 kg/m   Constitutional:  Alert and oriented, No acute distress. HEENT: [place] AT, moist mucus membranes.  Trachea midline, no masses. Cardiovascular: No clubbing, cyanosis, or edema.  RRR Respiratory: Normal respiratory effort, no increased work of breathing.  Lungs clear GI: Abdomen is soft, nontender, nondistended, no abdominal masses GU: No CVA tenderness Lymph: No cervical or inguinal lymphadenopathy. Skin: No rashes, bruises or suspicious lesions. Neurologic: Grossly intact, no focal deficits, moving all 4 extremities. Psychiatric: Normal mood and affect.     Assessment & Plan:    1. Retained ureteral stent Her stent has been indwelling for over 8 months and I am concerned she may have significant encrustation.  No recent imaging has been performed.  She was sent for a KUB and will most likely need to be scheduled for cystoscopy with stent removal under anesthesia.  The procedure was discussed in detail including potential need for ureteroscopy.  She will be notified with her KUB results and further recommendations.    Riki Altes, MD  [facility] [address] [place], [place] [address] [phone]

## 2018-07-19 ENCOUNTER — Telehealth: Payer: Self-pay | Admitting: Urology

## 2018-07-19 NOTE — Telephone Encounter (Signed)
KUB reviewed and distal portion of stent is encrusted.  Will need to schedule cystoscopy/stent removal and possible ureteroscopy under anesthesia.

## 2018-07-20 ENCOUNTER — Other Ambulatory Visit: Payer: Self-pay | Admitting: Radiology

## 2018-07-20 DIAGNOSIS — Z960 Presence of urogenital implants: Secondary | ICD-10-CM

## 2018-07-20 LAB — CULTURE, URINE COMPREHENSIVE

## 2018-07-20 NOTE — Telephone Encounter (Signed)
Notified patient of Dr Heywood Footman note below & scheduled stent removal for 08/01/2018. Instructions given. Questions answered. Patient voices understanding.

## 2018-07-31 ENCOUNTER — Inpatient Hospital Stay: Admission: RE | Admit: 2018-07-31 | Payer: Medicaid Other | Source: Ambulatory Visit

## 2018-07-31 ENCOUNTER — Other Ambulatory Visit: Payer: Self-pay | Admitting: Radiology

## 2018-07-31 ENCOUNTER — Telehealth: Payer: Self-pay | Admitting: Radiology

## 2018-07-31 NOTE — Telephone Encounter (Signed)
Called patient to discuss missed Pre-admission testing appointment today. Patient reports asthma exacerbation & "feels like I'm getting a cold". Advised patient that surgery will be postponed until 08/22/2018 & pre-admission testing appointment will be rescheduled accordingly. Will need to repeat urine culture prior to surgery which can be done at pre-admission testing appointment along with a urine drug screen on the day prior to surgery. Questions answered. Patient expresses understanding of all instructions.

## 2018-08-07 ENCOUNTER — Other Ambulatory Visit: Payer: Self-pay | Admitting: Radiology

## 2018-08-11 ENCOUNTER — Other Ambulatory Visit: Payer: Self-pay

## 2018-08-11 ENCOUNTER — Encounter
Admission: RE | Admit: 2018-08-11 | Discharge: 2018-08-11 | Disposition: A | Discharge status: Home / Self Care | Payer: Medicaid Other | Source: Ambulatory Visit | Attending: Urology | Admitting: Urology

## 2018-08-11 DIAGNOSIS — Z01812 Encounter for preprocedural laboratory examination: Secondary | ICD-10-CM | POA: Insufficient documentation

## 2018-08-11 HISTORY — DX: Personal history of urinary calculi: Z87.442

## 2018-08-11 NOTE — Patient Instructions (Signed)
Your procedure is scheduled on: August 22, 2018 TUESDAY Report [facility] To find out your arrival time, please call [phone] between 1PM - 3PM on: Monday August 21, 2018   REMEMBER: Instructions that are not followed completely may result in serious medical risk, up to and including death; or upon the discretion of your surgeon and anesthesiologist your surgery may need to be rescheduled.  Do not eat food after midnight the night before surgery.  No gum chewing, lozengers or hard candies.  You may however, drink CLEAR liquids up to 2 hours before you are scheduled to arrive for your surgery. Do not drink anything within 2 hours of the start of your surgery.  Clear liquids include: - water  - apple juice without pulp - gatorade - black coffee or tea (Do NOT add milk or creamers to the coffee or tea) Do NOT drink anything that is not on this list.  No Alcohol for 24 hours before or after surgery.  No Smoking including e-cigarettes for 24 hours prior to surgery.  No chewable tobacco products for at least 6 hours prior to surgery.  No nicotine patches on the day of surgery.  On the morning of surgery brush your teeth with toothpaste and water, you may rinse your mouth with mouthwash if you wish. Do not swallow any toothpaste or mouthwash.  Notify your doctor if there is any change in your medical condition (cold, fever, infection).  Do not wear jewelry, make-up, hairpins, clips or nail polish.  Do not wear lotions, powders, or perfumes.   Do not shave 48 hours prior to surgery.   Contacts and dentures may not be worn into surgery.  Do not bring valuables to the hospital, including drivers license, insurance or credit cards.  [facility] is not responsible for any belongings or valuables.   TAKE THESE MEDICATIONS THE MORNING OF SURGERY: NONE    Stop Anti-inflammatories (NSAIDS) such as Advil, Aleve, Ibuprofen, Motrin,  Naproxen, Naprosyn and Aspirin based products such as Excedrin, Goodys Powder, BC Powder. (May take Tylenol or Acetaminophen if needed.)  Stop ANY OVER THE COUNTER supplements until after surgery. (May continue Vitamin D, Vitamin B, and multivitamin.)  Wear comfortable clothing (specific to your surgery type) to the hospital.  Plan for stool softeners for home use.  If you are being discharged the day of surgery, you will not be allowed to drive home. You will need a responsible adult to drive you home and stay with you that night.   If you are taking public transportation, you will need to have a responsible adult with you. Please confirm with your physician that it is acceptable to use public transportation.   Please call [phone] if you have any questions about these instructions.

## 2018-08-12 LAB — URINE CULTURE

## 2018-08-18 NOTE — Progress Notes (Signed)
Please note urine culture collected 08/11/2018 positive for >100,000 colonies Group B Strep. Surgery scheduled 08/22/2018. Do antibiotics need to be ordered?

## 2018-08-21 ENCOUNTER — Telehealth: Payer: Self-pay | Admitting: Radiology

## 2018-08-21 ENCOUNTER — Other Ambulatory Visit: Payer: Self-pay | Admitting: Radiology

## 2018-08-21 ENCOUNTER — Inpatient Hospital Stay: Admission: RE | Admit: 2018-08-21 | Payer: Medicaid Other | Source: Ambulatory Visit

## 2018-08-21 DIAGNOSIS — Z960 Presence of urogenital implants: Secondary | ICD-10-CM

## 2018-08-21 MED ORDER — SODIUM CHLORIDE 0.9 % IV SOLN
2.0000 g | INTRAVENOUS | Status: AC
  Administered 2018-08-22: 2 g via INTRAVENOUS
  Filled 2018-08-21 (×2): qty 2000

## 2018-08-21 MED ORDER — AMOXICILLIN 875 MG PO TABS: 875.0000 mg | ORAL_TABLET | Freq: Two times a day (BID) | ORAL | 0 refills | Status: DC

## 2018-08-21 NOTE — Telephone Encounter (Signed)
Unable to reach patient. Message states unable to complete call at this time.

## 2018-08-21 NOTE — Telephone Encounter (Signed)
Patient came to the office after going to pre-admission testing. Informed patient of positive urine culture & script sent to pharmacy. Dosing instructions were given. Patient expresses understanding of conversation.

## 2018-08-21 NOTE — Telephone Encounter (Signed)
-----   Message from Riki AltesScott C Stoioff, MD sent at 08/20/2018 11:33 AM EST ----- Start amoxil 875 bid X 7 days. Change iv antibiotic preop to ampicillin 2 gms- ok if she starts oral antibiotic 11/25 ----- Message ----- From: Nicolasa DuckingHeidel, Keiandra Sullenger Leigh, RN Sent: 08/18/2018  11:21 AM EST To: Riki AltesScott C Stoioff, MD

## 2018-08-22 ENCOUNTER — Ambulatory Visit: Payer: Self-pay | Admitting: Anesthesiology

## 2018-08-22 ENCOUNTER — Encounter: Payer: Self-pay | Admitting: Anesthesiology

## 2018-08-22 ENCOUNTER — Ambulatory Visit
Admission: RE | Admit: 2018-08-22 | Discharge: 2018-08-22 | Disposition: A | Discharge status: Home / Self Care | Payer: Self-pay | Source: Ambulatory Visit | Attending: Urology | Admitting: Urology

## 2018-08-22 ENCOUNTER — Other Ambulatory Visit: Payer: Self-pay

## 2018-08-22 ENCOUNTER — Encounter
Admission: RE | Disposition: A | Discharge status: Home / Self Care | Payer: Self-pay | Source: Ambulatory Visit | Attending: Urology

## 2018-08-22 DIAGNOSIS — J45909 Unspecified asthma, uncomplicated: Secondary | ICD-10-CM | POA: Insufficient documentation

## 2018-08-22 DIAGNOSIS — T8389XA Other specified complication of genitourinary prosthetic devices, implants and grafts, initial encounter: Secondary | ICD-10-CM | POA: Insufficient documentation

## 2018-08-22 DIAGNOSIS — Y738 Miscellaneous gastroenterology and urology devices associated with adverse incidents, not elsewhere classified: Secondary | ICD-10-CM | POA: Insufficient documentation

## 2018-08-22 DIAGNOSIS — Z87442 Personal history of urinary calculi: Secondary | ICD-10-CM | POA: Insufficient documentation

## 2018-08-22 DIAGNOSIS — Z960 Presence of urogenital implants: Secondary | ICD-10-CM

## 2018-08-22 DIAGNOSIS — Z79899 Other long term (current) drug therapy: Secondary | ICD-10-CM | POA: Insufficient documentation

## 2018-08-22 DIAGNOSIS — T83192A Other mechanical complication of urinary stent, initial encounter: Secondary | ICD-10-CM

## 2018-08-22 DIAGNOSIS — F319 Bipolar disorder, unspecified: Secondary | ICD-10-CM | POA: Insufficient documentation

## 2018-08-22 DIAGNOSIS — F17210 Nicotine dependence, cigarettes, uncomplicated: Secondary | ICD-10-CM | POA: Insufficient documentation

## 2018-08-22 DIAGNOSIS — K219 Gastro-esophageal reflux disease without esophagitis: Secondary | ICD-10-CM | POA: Insufficient documentation

## 2018-08-22 HISTORY — PX: CYSTOSCOPY WITH URETEROSCOPY: SHX5123

## 2018-08-22 HISTORY — PX: CYSTOSCOPY WITH HOLMIUM LASER LITHOTRIPSY: SHX6639

## 2018-08-22 HISTORY — PX: CYSTOSCOPY W/ RETROGRADES: SHX1426

## 2018-08-22 HISTORY — PX: CYSTOSCOPY W/ URETERAL STENT REMOVAL: SHX1430

## 2018-08-22 LAB — URINE DRUG SCREEN, QUALITATIVE (ARMC ONLY)
AMPHETAMINES, UR SCREEN: NOT DETECTED
BARBITURATES, UR SCREEN: NOT DETECTED
Benzodiazepine, Ur Scrn: NOT DETECTED
COCAINE METABOLITE, UR ~~LOC~~: NOT DETECTED
Cannabinoid 50 Ng, Ur ~~LOC~~: NOT DETECTED
MDMA (Ecstasy)Ur Screen: NOT DETECTED
METHADONE SCREEN, URINE: NOT DETECTED
Opiate, Ur Screen: NOT DETECTED
Phencyclidine (PCP) Ur S: NOT DETECTED
Tricyclic, Ur Screen: NOT DETECTED

## 2018-08-22 LAB — POCT PREGNANCY, URINE: Preg Test, Ur: NEGATIVE

## 2018-08-22 SURGERY — REMOVAL, STENT, URETER, CYSTOSCOPIC
Anesthesia: General | Laterality: Right

## 2018-08-22 MED ORDER — CEFAZOLIN SODIUM-DEXTROSE 2-4 GM/100ML-% IV SOLN
INTRAVENOUS | Status: AC
  Filled 2018-08-22: qty 100

## 2018-08-22 MED ORDER — FENTANYL CITRATE (PF) 100 MCG/2ML IJ SOLN
INTRAMUSCULAR | Status: AC
  Administered 2018-08-22: 25 ug via INTRAVENOUS
  Filled 2018-08-22: qty 2

## 2018-08-22 MED ORDER — LABETALOL HCL 5 MG/ML IV SOLN
INTRAVENOUS | Status: DC | PRN
  Administered 2018-08-22: 5 mg via INTRAVENOUS

## 2018-08-22 MED ORDER — PHENYLEPHRINE HCL 10 MG/ML IJ SOLN
INTRAMUSCULAR | Status: DC | PRN
  Administered 2018-08-22 (×3): 100 ug via INTRAVENOUS

## 2018-08-22 MED ORDER — IOPAMIDOL (ISOVUE-200) INJECTION 41%
INTRAVENOUS | Status: DC | PRN
  Administered 2018-08-22: 12 mL

## 2018-08-22 MED ORDER — FENTANYL CITRATE (PF) 100 MCG/2ML IJ SOLN
INTRAMUSCULAR | Status: AC
  Filled 2018-08-22: qty 2

## 2018-08-22 MED ORDER — MIDAZOLAM HCL 2 MG/2ML IJ SOLN
INTRAMUSCULAR | Status: DC | PRN
  Administered 2018-08-22: 2 mg via INTRAVENOUS

## 2018-08-22 MED ORDER — FENTANYL CITRATE (PF) 100 MCG/2ML IJ SOLN
INTRAMUSCULAR | Status: DC | PRN
  Administered 2018-08-22 (×3): 50 ug via INTRAVENOUS

## 2018-08-22 MED ORDER — FENTANYL CITRATE (PF) 100 MCG/2ML IJ SOLN
25.0000 ug | INTRAMUSCULAR | Status: AC | PRN
  Administered 2018-08-22 (×6): 25 ug via INTRAVENOUS

## 2018-08-22 MED ORDER — FAMOTIDINE 20 MG PO TABS
20.0000 mg | ORAL_TABLET | Freq: Once | ORAL | Status: AC
  Administered 2018-08-22: 20 mg via ORAL

## 2018-08-22 MED ORDER — ONDANSETRON HCL 4 MG/2ML IJ SOLN: 4.0000 mg | Freq: Once | INTRAMUSCULAR | Status: DC | PRN

## 2018-08-22 MED ORDER — LIDOCAINE HCL (PF) 2 % IJ SOLN
INTRAMUSCULAR | Status: AC
  Filled 2018-08-22: qty 10

## 2018-08-22 MED ORDER — SUCCINYLCHOLINE CHLORIDE 20 MG/ML IJ SOLN
INTRAMUSCULAR | Status: DC | PRN
  Administered 2018-08-22: 100 mg via INTRAVENOUS

## 2018-08-22 MED ORDER — DEXAMETHASONE SODIUM PHOSPHATE 10 MG/ML IJ SOLN
INTRAMUSCULAR | Status: AC
  Filled 2018-08-22: qty 1

## 2018-08-22 MED ORDER — DEXAMETHASONE SODIUM PHOSPHATE 10 MG/ML IJ SOLN
INTRAMUSCULAR | Status: DC | PRN
  Administered 2018-08-22: 10 mg via INTRAVENOUS

## 2018-08-22 MED ORDER — HYDROCODONE-ACETAMINOPHEN 7.5-325 MG PO TABS
1.0000 | ORAL_TABLET | Freq: Once | ORAL | Status: AC
  Administered 2018-08-22: 1 via ORAL

## 2018-08-22 MED ORDER — FAMOTIDINE 20 MG PO TABS
ORAL_TABLET | ORAL | Status: AC
  Filled 2018-08-22: qty 1

## 2018-08-22 MED ORDER — MIDAZOLAM HCL 2 MG/2ML IJ SOLN
INTRAMUSCULAR | Status: AC
  Filled 2018-08-22: qty 2

## 2018-08-22 MED ORDER — LACTATED RINGERS IV SOLN
INTRAVENOUS | Status: DC
  Administered 2018-08-22: 10:00:00 via INTRAVENOUS

## 2018-08-22 MED ORDER — HYDROCODONE-ACETAMINOPHEN 7.5-325 MG PO TABS
ORAL_TABLET | ORAL | Status: AC
  Administered 2018-08-22: 1 via ORAL
  Filled 2018-08-22: qty 1

## 2018-08-22 MED ORDER — PROPOFOL 10 MG/ML IV BOLUS
INTRAVENOUS | Status: AC
  Filled 2018-08-22: qty 20

## 2018-08-22 MED ORDER — PROPOFOL 10 MG/ML IV BOLUS
INTRAVENOUS | Status: DC | PRN
  Administered 2018-08-22: 130 mg via INTRAVENOUS

## 2018-08-22 MED ORDER — LIDOCAINE HCL (CARDIAC) PF 100 MG/5ML IV SOSY
PREFILLED_SYRINGE | INTRAVENOUS | Status: DC | PRN
  Administered 2018-08-22: 100 mg via INTRAVENOUS

## 2018-08-22 MED ORDER — ONDANSETRON HCL 4 MG/2ML IJ SOLN
INTRAMUSCULAR | Status: AC
  Filled 2018-08-22: qty 2

## 2018-08-22 MED ORDER — ONDANSETRON HCL 4 MG/2ML IJ SOLN
INTRAMUSCULAR | Status: DC | PRN
  Administered 2018-08-22: 4 mg via INTRAVENOUS

## 2018-08-22 SURGICAL SUPPLY — 16 items
BAG DRAIN CYSTO-URO LG1000N (MISCELLANEOUS) ×4 IMPLANT
CATH URETL 5X70 OPEN END (CATHETERS) ×4 IMPLANT
FIBER LASER LITHO 273 (Laser) ×4 IMPLANT
GLOVE BIO SURGEON STRL SZ8 (GLOVE) ×4 IMPLANT
GOWN STRL REUS W/ TWL LRG LVL3 (GOWN DISPOSABLE) ×4 IMPLANT
GOWN STRL REUS W/ TWL XL LVL3 (GOWN DISPOSABLE) ×2 IMPLANT
GOWN STRL REUS W/TWL LRG LVL3 (GOWN DISPOSABLE) ×4
GOWN STRL REUS W/TWL XL LVL3 (GOWN DISPOSABLE) ×2
PACK CYSTO AR (MISCELLANEOUS) ×4 IMPLANT
SENSORWIRE 0.038 NOT ANGLED (WIRE) ×4
SET CYSTO W/LG BORE CLAMP LF (SET/KITS/TRAYS/PACK) ×4 IMPLANT
SOL .9 NS 3000ML IRR  AL (IV SOLUTION) ×2
SOL .9 NS 3000ML IRR UROMATIC (IV SOLUTION) ×2 IMPLANT
SURGILUBE 2OZ TUBE FLIPTOP (MISCELLANEOUS) ×4 IMPLANT
WATER STERILE IRR 1000ML POUR (IV SOLUTION) ×4 IMPLANT
WIRE SENSOR 0.038 NOT ANGLED (WIRE) ×2 IMPLANT

## 2018-08-22 NOTE — Anesthesia Procedure Notes (Signed)
Procedure Name: Intubation Date/Time: 08/22/2018 10:24 AM Performed by: Ginger CarneMichelet, Keshona Kartes, CRNA Pre-anesthesia Checklist: Patient identified, Emergency Drugs available, Suction available, Patient being monitored and Timeout performed Patient Re-evaluated:Patient Re-evaluated prior to induction Oxygen Delivery Method: Circle system utilized Preoxygenation: Pre-oxygenation with 100% oxygen Induction Type: IV induction Ventilation: Mask ventilation without difficulty and Oral airway inserted - appropriate to patient size Laryngoscope Size: McGraph and 3 Grade View: Grade II Tube type: Oral Tube size: 7.5 mm Number of attempts: 2 Airway Equipment and Method: Stylet,  Video-laryngoscopy and Bougie stylet Placement Confirmation: ETT inserted through vocal cords under direct vision,  positive ETCO2 and breath sounds checked- equal and bilateral Secured at: 21 cm Tube secured with: Tape Dental Injury: Teeth and Oropharynx as per pre-operative assessment and Bloody posterior oropharynx  Difficulty Due To: Difficulty was anticipated, Difficult Airway- due to large tongue, Difficult Airway- due to reduced neck mobility and Difficult Airway- due to limited oral opening

## 2018-08-22 NOTE — OR Nursing (Signed)
Discussed discharge instructions with pt and boyfriend/Mom. All voice understanding.

## 2018-08-22 NOTE — Discharge Instructions (Signed)

## 2018-08-22 NOTE — Op Note (Signed)
Preoperative diagnosis:  1. Retained right ureteral stent 2. History right ureteral calculus  Postoperative diagnosis:  1. Same  Procedure: 1. Cystoscopy with foreign body removal-complicated 2. Laser lithotripsy encrusted ureteral stent 3. Right ureteroscopy 4. Right retrograde pyelogram with interpretation   Surgeon: Riki AltesScott C , MD  Anesthesia: General  Complications: None  Intraoperative findings:  1.  Significant encrustation distal stent curl and bladder 2.  Right ureteroscopy-no stone identified 3.  Right retrograde pyelogram-ureter normal in caliber without dilation or filling defect.  Prompt drainage of contrast from ureter and collecting system on real-time fluoroscopy  EBL: Minimal  Specimens: None  Indication: Amy Pennington is a 37 y.o. patient who had a right ureteral stent placed on 10/28/2017 secondary to a 3 mm obstructing distal calculus with sepsis. She was scheduled several times for definitive stone treatment however urine drug screen was positive for cocaine. It was elected to attempt to remove her stent in the office in June 2019 however she did not show for that appointment.  She recently presented to the ED complaining of bladder pain.  KUB showed encrustation of the bladder portion of the stent.  After reviewing the management options for treatment, he elected to proceed with the above surgical procedure(s). We have discussed the potential benefits and risks of the procedure, side effects of the proposed treatment, the likelihood of the patient achieving the goals of the procedure, and any potential problems that might occur during the procedure or recuperation. Informed consent has been obtained.  Description of procedure:  The patient was taken to the operating room and general anesthesia was induced.  The patient was placed in the dorsal lithotomy position, prepped and draped in the usual sterile fashion, and preoperative antibiotics were  administered. A preoperative time-out was performed.   A 21 French cystoscope was lubricated and passed per urethra.  Panendoscopy was performed.  The stent was easily visualized with fairly significant encrustation of the bladder curl. Other than inflammatory changes of the trigone secondary to the stent no bladder mucosal lesions were identified.  The bladder curl would not unfurl grasped with endoscopic forceps.  A 273 m laser fiber was placed through the cystoscope and the encrustation was fragmented at a setting of 0.2 J / 40 Hz.  Once removed the stent was removed without difficulty under fluoroscopic guidance.  Some remaining fragments in the bladder were removed with a combination of irrigation and endoscopic forceps.  A 0.038 Sensor wire was then placed through the cystoscope and into the right ureteral orifice.  A 4.5 French semirigid ureteroscope was then passed per urethra.  The ureter was normal in appearance up to the proximal portion.  No stone was identified.  Right retrograde pyelograms and performed through the ureteroscope with findings as described above.  The ureteroscope was removed.  It was elected not to place a ureteral stent.  After anesthetic reversal patient was transported to PACU in stable condition.   Riki AltesScott C , M.D.

## 2018-08-22 NOTE — Anesthesia Preprocedure Evaluation (Signed)
Anesthesia Evaluation  Patient identified by MRN, date of birth, ID band Patient awake    Reviewed: Allergy & Precautions, NPO status , Patient's Chart, lab work & pertinent test results, reviewed documented beta blocker date and time   Airway Mallampati: III  TM Distance: >3 FB     Dental  (+) Chipped   Pulmonary asthma , Current Smoker,           Cardiovascular      Neuro/Psych  Headaches, PSYCHIATRIC DISORDERS Depression Bipolar Disorder Schizophrenia    GI/Hepatic GERD  Controlled,  Endo/Other  Morbid obesity  Renal/GU      Musculoskeletal   Abdominal   Peds  Hematology   Anesthesia Other Findings EKG ok from 2 yr ago.  Reproductive/Obstetrics                             Anesthesia Physical Anesthesia Plan  ASA: III  Anesthesia Plan: General   Post-op Pain Management:    Induction: Intravenous  PONV Risk Score and Plan:   Airway Management Planned: Oral ETT  Additional Equipment:   Intra-op Plan:   Post-operative Plan:   Informed Consent: I have reviewed the patients History and Physical, chart, labs and discussed the procedure including the risks, benefits and alternatives for the proposed anesthesia with the patient or authorized representative who has indicated his/her understanding and acceptance.     Plan Discussed with: CRNA  Anesthesia Plan Comments:         Anesthesia Quick Evaluation

## 2018-08-22 NOTE — Transfer of Care (Signed)
Immediate Anesthesia Transfer of Care Note  Patient: Amy Pennington  Procedure(s) Performed: CYSTOSCOPY WITH STENT REMOVAL (Right ) CYSTOSCOPY WITH URETEROSCOPY (Right ) CYSTOSCOPY WITH HOLMIUM LASER LITHOTRIPSY (Right ) CYSTOSCOPY WITH RETROGRADE PYELOGRAM  Patient Location: PACU  Anesthesia Type:General  Level of Consciousness: drowsy  Airway & Oxygen Therapy: Patient Spontanous Breathing and Patient connected to face mask oxygen  Post-op Assessment: Report given to RN and Post -op Vital signs reviewed and stable  Post vital signs: Reviewed and stable  Last Vitals:  Vitals Value Taken Time  BP 125/51 08/22/2018 11:45 AM  Temp 36.5 C 08/22/2018 11:45 AM  Pulse 79 08/22/2018 11:48 AM  Resp 15 08/22/2018 11:48 AM  SpO2 96 % 08/22/2018 11:48 AM  Vitals shown include unvalidated device data.  Last Pain:  Vitals:   08/22/18 1145  TempSrc:   PainSc: Asleep         Complications: No apparent anesthesia complications

## 2018-08-22 NOTE — Anesthesia Post-op Follow-up Note (Signed)
Anesthesia QCDR form completed.        

## 2018-08-22 NOTE — Interval H&P Note (Signed)
History and Physical Interval Note:  08/22/2018 10:04 AM  Amy Pennington  has presented today for surgery, with the diagnosis of retained right ureteral stent  The various methods of treatment have been discussed with the patient and family. After consideration of risks, benefits and other options for treatment, the patient has consented to  Procedure(s): CYSTOSCOPY WITH STENT REMOVAL (Right) CYSTOSCOPY WITH URETEROSCOPY (Right) HOLMIUM LASER APPLICATION (Right) CYSTOSCOPY WITH HOLMIUM LASER LITHOTRIPSY (Right) as a surgical intervention .  The patient's history has been reviewed, patient examined, no change in status, stable for surgery.  I have reviewed the patient's chart and labs.  Questions were answered to the patient's satisfaction.      C 

## 2018-08-22 NOTE — H&P (Signed)
08/22/2018 10:01 AM   Amy Pennington 09-19-1981 [id number]  Referring provider: No referring provider defined for this encounter.  HPI: 37 year old female who had a right ureteral stent placed on 10/28/2017 secondary to a 3 mm obstructing distal calculus with sepsis.  She was scheduled several times for definitive stone treatment however urine drug screen was positive for cocaine.  It was elected to attempt to remove her stent in the office in June 2019 however she did not show for that appointment.  She states she has not followed up regarding the stent because she was "scared".  She was seen in the ED on 07/03/2018 complaining of pelvic pain and hematuria.  She denies fever or chills.  A KUB showed encrustation of the distal bladder curl however no definite encrustation is seen on the proximal loop.  She is scheduled for stent removal under anesthesia and possible ureteroscopy.   PMH:     Past Medical History:  Diagnosis Date  . Asthma   . Bipolar 1 disorder (HCC)   . Depression   . GERD (gastroesophageal reflux disease)   . Migraines   . Schizo affective schizophrenia [facility])     Surgical History:      Past Surgical History:  Procedure Laterality Date  . CYSTOSCOPY WITH STENT PLACEMENT Right 10/28/2017   Procedure: CYSTOSCOPY WITH right ureteral STENT PLACEMENT;  Surgeon: Riki Altes, MD;  Location: [facility] ORS;  Service: Urology;  Laterality: Right;  . none      Home Medications:  Allergies as of 07/17/2018   No Known Allergies              Medication List            Accurate as of 07/17/18  8:42 AM. Always use your most recent med list.           albuterol 108 (90 Base) MCG/ACT inhaler Commonly known as:  PROVENTIL HFA;VENTOLIN HFA Inhale 1-2 puffs into the lungs every 6 (six) hours as needed for wheezing or shortness of breath.   cephALEXin 500 MG capsule Commonly known as:  KEFLEX Take 1 capsule (500 mg total) by mouth 3 (three)  times daily.   HAIR SKIN NAILS PO Take 2 tablets by mouth daily.   IBUPROFEN IB PO Take by mouth as needed.   phenazopyridine 200 MG tablet Commonly known as:  PYRIDIUM Take 1 tablet (200 mg total) by mouth 3 (three) times daily as needed for pain.   tamsulosin 0.4 MG Caps capsule Commonly known as:  FLOMAX Take 1 capsule (0.4 mg total) by mouth daily after supper.   traMADol 50 MG tablet Commonly known as:  ULTRAM Take 1 tablet (50 mg total) by mouth every 6 (six) hours as needed for moderate pain.       Allergies: No Known Allergies  Family History:      Family History  Adopted: Yes  Problem Relation Age of Onset  . Asthma Mother   . Depression Mother   . Diabetes Brother     Social History:  reports that she has been smoking cigarettes. She has a 3.75 pack-year smoking history. She quit smokeless tobacco use about 14 years ago.  Her smokeless tobacco use included snuff. She reports that she drinks about 1.0 standard drinks of alcohol per week. She reports that she has current or past drug history. Drugs: Cocaine and Marijuana. Frequency: 2.00 times per week.  ROS: UROLOGY Frequent Urination?: Yes Hard to postpone urination?: No Burning/pain with  urination?: Yes Get up at night to urinate?: Yes Leakage of urine?: No Urine stream starts and stops?: No Trouble starting stream?: No Do you have to strain to urinate?: No Blood in urine?: No Urinary tract infection?: No Sexually transmitted disease?: No Injury to kidneys or bladder?: No Painful intercourse?: No Weak stream?: No Currently pregnant?: No Vaginal bleeding?: Yes Last menstrual period?: Unsure  Gastrointestinal Nausea?: No Vomiting?: No Indigestion/heartburn?: No Diarrhea?: No Constipation?: No  Constitutional Fever: No Night sweats?: No Weight loss?: No Fatigue?: No  Skin Skin rash/lesions?: No Itching?: No  Eyes Blurred vision?: No Double vision?:  No  Ears/Nose/Throat Sore throat?: No Sinus problems?: No  Hematologic/Lymphatic Swollen glands?: No Easy bruising?: No  Cardiovascular Leg swelling?: No Chest pain?: No  Respiratory Cough?: No Shortness of breath?: No  Endocrine Excessive thirst?: No  Musculoskeletal Back pain?: No Joint pain?: No  Neurological Headaches?: No Dizziness?: No  Psychologic Depression?: No Anxiety?: No  Physical Exam: BP (!) 143/96 (BP Location: Left Arm, Patient Position: Sitting, Cuff Size: Large)   Pulse 92   Ht 5\' 3"  (1.6 m)   Wt 268 lb 12.8 oz (121.9 kg)   BMI 47.62 kg/m   Constitutional:  Alert and oriented, No acute distress. HEENT: [place] AT, moist mucus membranes.  Trachea midline, no masses. Cardiovascular: No clubbing, cyanosis, or edema.  RRR Respiratory: Normal respiratory effort, no increased work of breathing.  Lungs clear GI: Abdomen is soft, nontender, nondistended, no abdominal masses GU: No CVA tenderness Lymph: No cervical or inguinal lymphadenopathy. Skin: No rashes, bruises or suspicious lesions. Neurologic: Grossly intact, no focal deficits, moving all 4 extremities. Psychiatric: Normal mood and affect.    Assessment & Plan:   She presents for stent removal under anesthesia and possible ureteroscopy.  The procedure has been discussed in detail including potential risks of bleeding, infection/sepsis.  She indicated all questions were answered and desires to proceed.  Riki AltesScott C Stoioff, MD  [facility] [address], [address] [place], [place] [address] [phone]

## 2018-08-22 NOTE — Anesthesia Postprocedure Evaluation (Signed)
Anesthesia Post Note  Patient: Amy Pennington  Procedure(s) Performed: CYSTOSCOPY WITH STENT REMOVAL (Right ) CYSTOSCOPY WITH URETEROSCOPY (Right ) CYSTOSCOPY WITH HOLMIUM LASER LITHOTRIPSY (Right ) CYSTOSCOPY WITH RETROGRADE PYELOGRAM  Patient location during evaluation: PACU Anesthesia Type: General Level of consciousness: awake and alert Pain management: pain level controlled Vital Signs Assessment: post-procedure vital signs reviewed and stable Respiratory status: spontaneous breathing, nonlabored ventilation, respiratory function stable and patient connected to nasal cannula oxygen Cardiovascular status: blood pressure returned to baseline and stable Postop Assessment: no apparent nausea or vomiting Anesthetic complications: no     Last Vitals:  Vitals:   08/22/18 1245 08/22/18 1319  BP: 111/66 113/65  Pulse: 77   Resp: 15 16  Temp:    SpO2: 99%     Last Pain:  Vitals:   08/22/18 1329  TempSrc:   PainSc: 4                  Lyfe Reihl S

## 2018-08-23 ENCOUNTER — Telehealth: Payer: Self-pay | Admitting: Radiology

## 2018-08-23 ENCOUNTER — Encounter: Payer: Self-pay | Admitting: Urology

## 2018-08-23 MED ORDER — OXYBUTYNIN CHLORIDE 5 MG PO TABS: ORAL_TABLET | ORAL | 0 refills | Status: DC

## 2018-08-23 NOTE — Telephone Encounter (Signed)
6 weeks

## 2018-08-23 NOTE — Telephone Encounter (Signed)
LMOM that script was sent to pharmacy. 

## 2018-08-23 NOTE — Telephone Encounter (Signed)
Her pain is most likely related to bladder spasms.  Rx oxybutynin was sent to pharmacy.

## 2018-08-23 NOTE — Telephone Encounter (Signed)
Dr Lonna CobbStoioff - when should patient return for post op follow up? Appointment hasn't been scheduled yet.

## 2018-08-23 NOTE — Telephone Encounter (Signed)
Patient reports lower abdominal pain & burning with urination. She states she is taking AZO which has relieved burning. Asks for pain medication for abdominal pain. Patient may be reached at [phone].  Dr Lonna CobbStoioff - when should patient return for post op follow up? Appointment hasn't been scheduled yet.

## 2018-08-28 NOTE — Telephone Encounter (Signed)
LMOM to notify patient of post op appointment with Dr Lonna CobbStoioff. Also mailed reminder letter to address on file.

## 2018-10-04 ENCOUNTER — Ambulatory Visit: Payer: Self-pay | Admitting: Urology

## 2018-10-04 ENCOUNTER — Encounter: Payer: Self-pay | Admitting: Urology

## 2018-10-04 NOTE — Progress Notes (Incomplete)
10/04/2018 8:10 AM   Amy Pennington 1980/12/19 [id number]  Referring provider: No referring provider defined for this encounter.  No chief complaint on file.   HPI: Amy Pennington is a 38 yo F who returns today for s/p cystoscopy with stent removal under anesthesia and potential ureteroscopyl after KUB report.  -KUB showed distal portion of stent is encrusted.  -Continue oxybutynin for her bladder spasms     PMH: Past Medical History:  Diagnosis Date   Asthma    Bipolar 1 disorder (HCC)    Depression    GERD (gastroesophageal reflux disease)    History of kidney stones    Migraines    Schizo affective schizophrenia (HCC)     Surgical History: Past Surgical History:  Procedure Laterality Date   CYSTOSCOPY W/ RETROGRADES  08/22/2018   Procedure: CYSTOSCOPY WITH RETROGRADE PYELOGRAM;  Surgeon: Riki Altes, MD;  Location: [facility] ORS;  Service: Urology;;   CYSTOSCOPY W/ URETERAL STENT REMOVAL Right 08/22/2018   Procedure: CYSTOSCOPY WITH STENT REMOVAL;  Surgeon: Riki Altes, MD;  Location: [facility] ORS;  Service: Urology;  Laterality: Right;   CYSTOSCOPY WITH HOLMIUM LASER LITHOTRIPSY Right 08/22/2018   Procedure: CYSTOSCOPY WITH HOLMIUM LASER LITHOTRIPSY;  Surgeon: Riki Altes, MD;  Location: [facility] ORS;  Service: Urology;  Laterality: Right;   CYSTOSCOPY WITH STENT PLACEMENT Right 10/28/2017   Procedure: CYSTOSCOPY WITH right ureteral STENT PLACEMENT;  Surgeon: Riki Altes, MD;  Location: [facility] ORS;  Service: Urology;  Laterality: Right;   CYSTOSCOPY WITH URETEROSCOPY Right 08/22/2018   Procedure: CYSTOSCOPY WITH URETEROSCOPY;  Surgeon: Riki Altes, MD;  Location: [facility] ORS;  Service: Urology;  Laterality: Right;   none      Home Medications:  Allergies as of 10/04/2018   No Known Allergies     Medication List       Accurate as of October 04, 2018  8:10 AM. Always use your most recent med list.        amoxicillin 875 MG  tablet Commonly known as:  AMOXIL Take 1 tablet (875 mg total) by mouth every 12 (twelve) hours.   HAIR SKIN NAILS PO Take 2 tablets by mouth daily.   MULTIVITAMIN/IRON PO Take 1 tablet by mouth daily.   oxybutynin 5 MG tablet Commonly known as:  DITROPAN 1 tab tid prn frequency,urgency, bladder spasm       Allergies: No Known Allergies  Family History: Family History  Adopted: Yes  Problem Relation Age of Onset   Asthma Mother    Depression Mother    Diabetes Brother     Social History:  reports that she has been smoking cigarettes. She has a 3.75 pack-year smoking history. She quit smokeless tobacco use about 15 years ago.  Her smokeless tobacco use included snuff. She reports current alcohol use. She reports current drug use. Frequency: 2.00 times per week. Drugs: Cocaine and Marijuana.  ROS:                                        Physical Exam: There were no vitals taken for this visit.  Constitutional:  Alert and oriented, No acute distress. HEENT: [place] AT, moist mucus membranes.  Trachea midline, no masses. Cardiovascular: No clubbing, cyanosis, or edema. Respiratory: Normal respiratory effort, no increased work of breathing. GI: Abdomen is soft, nontender, nondistended, no abdominal masses GU: No CVA tenderness Lymph: No  cervical or inguinal lymphadenopathy. Skin: No rashes, bruises or suspicious lesions. Neurologic: Grossly intact, no focal deficits, moving all 4 extremities. Psychiatric: Normal mood and affect.  Laboratory Data: Lab Results  Component Value Date   WBC 7.6 07/03/2018   HGB 10.3 (L) 07/03/2018   HCT 33.8 (L) 07/03/2018   MCV 72.5 (L) 07/03/2018   PLT 486 (H) 07/03/2018    Lab Results  Component Value Date   CREATININE 0.76 07/03/2018    No results found for: PSA  No results found for: TESTOSTERONE  Lab Results  Component Value Date   HGBA1C 6.0 (H) 09/10/2016    Urinalysis    Component Value  Date/Time   COLORURINE YELLOW (A) 07/03/2018 1219   APPEARANCEUR Cloudy (A) 07/17/2018 0832   LABSPEC 1.018 07/03/2018 1219   LABSPEC 1.020 05/31/2013 2020   PHURINE 6.0 07/03/2018 1219   GLUCOSEU Negative 07/17/2018 0832   GLUCOSEU Negative 05/31/2013 2020   HGBUR LARGE (A) 07/03/2018 1219   BILIRUBINUR Negative 07/17/2018 0832   BILIRUBINUR Negative 05/31/2013 2020   KETONESUR NEGATIVE 07/03/2018 1219   PROTEINUR 1+ (A) 07/17/2018 0832   PROTEINUR 100 (A) 07/03/2018 1219   NITRITE Negative 07/17/2018 0832   NITRITE POSITIVE (A) 07/03/2018 1219   LEUKOCYTESUR 1+ (A) 07/17/2018 0832   LEUKOCYTESUR Negative 05/31/2013 2020    Lab Results  Component Value Date   LABMICR See below: 07/17/2018   WBCUA 6-10 (A) 07/17/2018   RBCUA >30 (H) 07/17/2018   LABEPIT 0-10 07/17/2018   MUCUS Present (A) 11/04/2017   BACTERIA Few (A) 07/17/2018    Pertinent Imaging: *** Results for orders placed during the hospital encounter of 07/17/18  Abdomen 1 view (KUB)   Narrative CLINICAL DATA:  38 year old female with retained right ureteral stone  EXAM: ABDOMEN - 1 VIEW  COMPARISON:  Prior CT abdomen/pelvis 03/08/2018  FINDINGS: The visualized lower chest is unremarkable. A right-sided double-J ureteral stent is present. The upper loop is partially reconstituted and overlies the lower renal pelvis. The distal loop is reconstituted and overlies the bladder. There has been slight interval migration of the tube into the bladder compared to 03/08/2018. No definite residual nephrolithiasis or ureterolithiasis identified. The bowel gas pattern is normal. No acute osseous abnormality.  IMPRESSION: 1. Slight interval distal migration of the right-sided double-J ureteral stent into the bladder compared to 03/08/2018. The proximal locking loop remains partially formed and overlying the inferior aspect of the renal pelvis. 2. No visible renal or ureteral stones.   Electronically Signed    By: Malachy MoanHeath  McCullough M.D.   On: 07/17/2018 09:40    No results found for this or any previous visit. No results found for this or any previous visit. No results found for this or any previous visit. No results found for this or any previous visit. No results found for this or any previous visit. No results found for this or any previous visit. Results for orders placed during the hospital encounter of 03/08/18  CT RENAL STONE STUDY   Narrative CLINICAL DATA:  Sharp intermittent pain to the vaginal area for 1 week. History of kidney stones.  EXAM: CT ABDOMEN AND PELVIS WITHOUT CONTRAST  TECHNIQUE: Multidetector CT imaging of the abdomen and pelvis was performed following the standard protocol without IV contrast.  COMPARISON:  CT AP 10/28/2017  FINDINGS: Lower chest: No acute abnormality.  Hepatobiliary: No focal liver abnormality is seen. No gallstones, gallbladder wall thickening, or biliary dilatation.  Pancreas: Unremarkable. No pancreatic ductal dilatation or surrounding  inflammatory changes.  Spleen: Normal in size without focal abnormality.  Adrenals/Urinary Tract: Low-attenuation nodule in right adrenal gland is identified and is unchanged from previous exam measuring 1.4 cm. Likely benign adenoma. Normal left adrenal gland. Unremarkable appearance of the left kidney. There is a right-sided nephroureteral stent in place. The stent appears to be in appropriate position. Mild right hydronephrosis noted. No urinary tract calculi identified at this time.  Stomach/Bowel: The stomach is normal. No abnormal dilatation of the small bowel loops. The appendix is visualized and appears normal. No pathologic dilatation of the colon.  Vascular/Lymphatic: No significant vascular findings are present. No enlarged abdominal or pelvic lymph nodes.  Reproductive: There is an intermediate attenuating cyst in the left ovary which is new from previous exam measuring 3.7 cm. The  uterus and right adnexa appears unremarkable.  Other: No abdominal wall hernia or abnormality. No abdominopelvic ascites.  Musculoskeletal: Moderate to advanced L2-3 degenerative disc disease. No aggressive lytic or sclerotic bone lesions.  IMPRESSION: 1. A right-sided nephroureteral stent is in place. There is mild right hydronephrosis identified. No urinary tract calculi identified at this time. 2. Left ovary intermediate attenuating cyst is new from previous exam and is favored to represent hemorrhagic cysts. Short-interval follow up ultrasound in 6-12 weeks is recommended, preferably during the week following the patient's normal menses. 3. Right adrenal nodule.  Likely benign adenoma.   Electronically Signed   By: Signa Kellaylor  Stroud M.D.   On: 03/08/2018 19:39     Assessment & Plan:    @DIAGMED @  No follow-ups on file.  [facility]  [facility] [address], [address] [place], [place] [address] [phone]

## 2018-11-06 ENCOUNTER — Encounter: Payer: Self-pay | Admitting: Radiology

## 2018-11-06 ENCOUNTER — Emergency Department: Payer: Medicaid Other

## 2018-11-06 ENCOUNTER — Other Ambulatory Visit: Payer: Self-pay

## 2018-11-06 ENCOUNTER — Emergency Department
Admission: EM | Admit: 2018-11-06 | Discharge: 2018-11-07 | Disposition: A | Discharge status: Home / Self Care | Payer: Medicaid Other | Attending: Emergency Medicine | Admitting: Emergency Medicine

## 2018-11-06 DIAGNOSIS — F17210 Nicotine dependence, cigarettes, uncomplicated: Secondary | ICD-10-CM | POA: Diagnosis not present

## 2018-11-06 DIAGNOSIS — O99331 Smoking (tobacco) complicating pregnancy, first trimester: Secondary | ICD-10-CM | POA: Insufficient documentation

## 2018-11-06 DIAGNOSIS — O200 Threatened abortion: Secondary | ICD-10-CM | POA: Insufficient documentation

## 2018-11-06 DIAGNOSIS — J45909 Unspecified asthma, uncomplicated: Secondary | ICD-10-CM | POA: Diagnosis not present

## 2018-11-06 DIAGNOSIS — R1031 Right lower quadrant pain: Secondary | ICD-10-CM | POA: Insufficient documentation

## 2018-11-06 DIAGNOSIS — O26899 Other specified pregnancy related conditions, unspecified trimester: Secondary | ICD-10-CM

## 2018-11-06 DIAGNOSIS — O9989 Other specified diseases and conditions complicating pregnancy, childbirth and the puerperium: Secondary | ICD-10-CM | POA: Diagnosis not present

## 2018-11-06 DIAGNOSIS — Z3A01 Less than 8 weeks gestation of pregnancy: Secondary | ICD-10-CM | POA: Insufficient documentation

## 2018-11-06 DIAGNOSIS — R109 Unspecified abdominal pain: Secondary | ICD-10-CM

## 2018-11-06 DIAGNOSIS — O99511 Diseases of the respiratory system complicating pregnancy, first trimester: Secondary | ICD-10-CM | POA: Diagnosis not present

## 2018-11-06 LAB — CBC WITH DIFFERENTIAL/PLATELET
ABS IMMATURE GRANULOCYTES: 0.02 10*3/uL (ref 0.00–0.07)
BASOS PCT: 1 %
Basophils Absolute: 0.1 10*3/uL (ref 0.0–0.1)
Eosinophils Absolute: 0.3 10*3/uL (ref 0.0–0.5)
Eosinophils Relative: 3 %
HCT: 42.1 % (ref 36.0–46.0)
Hemoglobin: 13.5 g/dL (ref 12.0–15.0)
Immature Granulocytes: 0 %
Lymphocytes Relative: 27 %
Lymphs Abs: 2.4 10*3/uL (ref 0.7–4.0)
MCH: 26.2 pg (ref 26.0–34.0)
MCHC: 32.1 g/dL (ref 30.0–36.0)
MCV: 81.6 fL (ref 80.0–100.0)
Monocytes Absolute: 0.4 10*3/uL (ref 0.1–1.0)
Monocytes Relative: 4 %
Neutro Abs: 5.5 10*3/uL (ref 1.7–7.7)
Neutrophils Relative %: 65 %
PLATELETS: 319 10*3/uL (ref 150–400)
RBC: 5.16 MIL/uL — AB (ref 3.87–5.11)
RDW: 16.4 % — ABNORMAL HIGH (ref 11.5–15.5)
WBC: 8.7 10*3/uL (ref 4.0–10.5)
nRBC: 0 % (ref 0.0–0.2)

## 2018-11-06 LAB — LIPASE, BLOOD: LIPASE: 30 U/L (ref 11–51)

## 2018-11-06 LAB — URINALYSIS, COMPLETE (UACMP) WITH MICROSCOPIC
BILIRUBIN URINE: NEGATIVE
Bacteria, UA: NONE SEEN
Glucose, UA: NEGATIVE mg/dL
Hgb urine dipstick: NEGATIVE
KETONES UR: NEGATIVE mg/dL
LEUKOCYTES UA: NEGATIVE
Nitrite: NEGATIVE
Protein, ur: NEGATIVE mg/dL
SPECIFIC GRAVITY, URINE: 1.015 (ref 1.005–1.030)
pH: 7 (ref 5.0–8.0)

## 2018-11-06 LAB — COMPREHENSIVE METABOLIC PANEL
ALT: 13 U/L (ref 0–44)
AST: 14 U/L — ABNORMAL LOW (ref 15–41)
Albumin: 3.7 g/dL (ref 3.5–5.0)
Alkaline Phosphatase: 40 U/L (ref 38–126)
Anion gap: 6 (ref 5–15)
BUN: 8 mg/dL (ref 6–20)
CHLORIDE: 104 mmol/L (ref 98–111)
CO2: 25 mmol/L (ref 22–32)
Calcium: 8.9 mg/dL (ref 8.9–10.3)
Creatinine, Ser: 0.66 mg/dL (ref 0.44–1.00)
GFR calc Af Amer: >60 mL/min (ref >60)
GLUCOSE: 110 mg/dL — AB (ref 70–99)
POTASSIUM: 3.6 mmol/L (ref 3.5–5.1)
SODIUM: 135 mmol/L (ref 135–145)
Total Bilirubin: 0.3 mg/dL (ref 0.3–1.2)
Total Protein: 6.4 g/dL — ABNORMAL LOW (ref 6.5–8.1)

## 2018-11-06 LAB — HCG, QUANTITATIVE, PREGNANCY: HCG, BETA CHAIN, QUANT, S: 24162 m[IU]/mL — AB (ref <5)

## 2018-11-06 LAB — POCT PREGNANCY, URINE: Preg Test, Ur: POSITIVE — AB

## 2018-11-06 LAB — ABO/RH: ABO/RH(D): A POS

## 2018-11-06 MED ORDER — ACETAMINOPHEN 500 MG PO TABS
1000.0000 mg | ORAL_TABLET | Freq: Once | ORAL | Status: AC
  Administered 2018-11-06: 1000 mg via ORAL
  Filled 2018-11-06: qty 2

## 2018-11-06 MED ORDER — KETOROLAC TROMETHAMINE 30 MG/ML IJ SOLN
15.0000 mg | Freq: Once | INTRAMUSCULAR | Status: DC
  Filled 2018-11-06: qty 1

## 2018-11-06 NOTE — ED Provider Notes (Signed)
[facility] Emergency Department Provider Note  ____________________________________________  Time seen: Approximately 10:46 PM  I have reviewed the triage vital signs and the nursing notes.   HISTORY  Chief Complaint Abdominal Pain   HPI Amy Pennington is a 38 y.o. female with history of kidney stones, schizoaffective disorder, asthma, GERD who presents for evaluation of abdominal pain.  Patient reports a moderate constant dull right lower quadrant abdominal pain for 3 days.  No vaginal discharge, nausea, vomiting, diarrhea, constipation.  Patient reports that her menstrual period started today but it was abnormal.  She reports just mild spotting.  She is sexually active.  She has never been pregnant before.  She denies dysuria or hematuria.  Past Medical History:  Diagnosis Date  . Asthma   . Bipolar 1 disorder (HCC)   . Depression   . GERD (gastroesophageal reflux disease)   . History of kidney stones   . Migraines   . Schizo affective schizophrenia [facility](HCC)     Patient Active Problem List   Diagnosis Date Noted  . Sepsis (HCC) 10/28/2017  . Septic shock (HCC)   . Asthma exacerbation 09/11/2016  . Breast abscess 03/19/2016  . Asthma 09/08/2013    Past Surgical History:  Procedure Laterality Date  . CYSTOSCOPY W/ RETROGRADES  08/22/2018   Procedure: CYSTOSCOPY WITH RETROGRADE PYELOGRAM;  Surgeon: Riki AltesStoioff, Scott C, MD;  Location: [facility] ORS;  Service: Urology;;  . Bluford KaufmannYSTOSCOPY W/ URETERAL STENT REMOVAL Right 08/22/2018   Procedure: CYSTOSCOPY WITH STENT REMOVAL;  Surgeon: Riki AltesStoioff, Scott C, MD;  Location: [facility] ORS;  Service: Urology;  Laterality: Right;  . CYSTOSCOPY WITH HOLMIUM LASER LITHOTRIPSY Right 08/22/2018   Procedure: CYSTOSCOPY WITH HOLMIUM LASER LITHOTRIPSY;  Surgeon: Riki AltesStoioff, Scott C, MD;  Location: [facility] ORS;  Service: Urology;  Laterality: Right;  . CYSTOSCOPY WITH STENT PLACEMENT Right 10/28/2017   Procedure: CYSTOSCOPY WITH right  ureteral STENT PLACEMENT;  Surgeon: Riki AltesStoioff, Scott C, MD;  Location: [facility] ORS;  Service: Urology;  Laterality: Right;  . CYSTOSCOPY WITH URETEROSCOPY Right 08/22/2018   Procedure: CYSTOSCOPY WITH URETEROSCOPY;  Surgeon: Riki AltesStoioff, Scott C, MD;  Location: [facility] ORS;  Service: Urology;  Laterality: Right;  . none      Prior to Admission medications   Medication Sig Start Date End Date Taking? Authorizing Provider  amoxicillin (AMOXIL) 875 MG tablet Take 1 tablet (875 mg total) by mouth every 12 (twelve) hours. 08/21/18   Stoioff, Verna CzechScott C, MD  Multiple Vitamins-Iron (MULTIVITAMIN/IRON PO) Take 1 tablet by mouth daily.    [provider]  Multiple Vitamins-Minerals (HAIR SKIN NAILS PO) Take 2 tablets by mouth daily.    [provider]  oxybutynin (DITROPAN) 5 MG tablet 1 tab tid prn frequency,urgency, bladder spasm 08/23/18   Stoioff, Verna CzechScott C, MD    Allergies Patient has no known allergies.  Family History  Adopted: Yes  Problem Relation Age of Onset  . Asthma Mother   . Depression Mother   . Diabetes Brother     Social History Social History   Tobacco Use  . Smoking status: Current Every Day Smoker    Packs/day: 0.25    Years: 15.00    Pack years: 3.75    Types: Cigarettes  . Smokeless tobacco: Former NeurosurgeonUser    Types: Snuff    Quit date: 2005  . Tobacco comment: pt states that she is thinking about alternatives  Substance Use Topics  . Alcohol use: Yes    Comment: occas  . Drug use:  Yes    Frequency: 2.0 times per week    Types: Cocaine, Marijuana    Comment: Cocaine and Marijuana once or twice a week    Review of Systems  Constitutional: Negative for fever. Eyes: Negative for visual changes. ENT: Negative for sore throat. Neck: No neck pain  Cardiovascular: Negative for chest pain. Respiratory: Negative for shortness of breath. Gastrointestinal: + RLQ abdominal pain. No vomiting or diarrhea. Genitourinary: Negative for dysuria. Musculoskeletal:  Negative for back pain. Skin: Negative for rash. Neurological: Negative for headaches, weakness or numbness. Psych: No SI or HI  ____________________________________________   PHYSICAL EXAM:  VITAL SIGNS: ED Triage Vitals  Enc Vitals Group     BP 11/06/18 2155 (!) 147/95     Pulse Rate 11/06/18 2155 88     Resp 11/06/18 2155 20     Temp 11/06/18 2155 98.6 F (37 C)     Temp Source 11/06/18 2155 Oral     SpO2 11/06/18 2155 100 %     Weight 11/06/18 2156 275 lb (124.7 kg)     Height 11/06/18 2156 5\' 3"  (1.6 m)     Head Circumference --      Peak Flow --      Pain Score 11/06/18 2155 6     Pain Loc --      Pain Edu? --      Excl. in GC? --     Constitutional: Alert and oriented. Well appearing and in no apparent distress. HEENT:      Head: Normocephalic and atraumatic.         Eyes: Conjunctivae are normal. Sclera is non-icteric.       Mouth/Throat: Mucous membranes are moist.       Neck: Supple with no signs of meningismus. Cardiovascular: Regular rate and rhythm. No murmurs, gallops, or rubs. 2+ symmetrical distal pulses are present in all extremities. No JVD. Respiratory: Normal respiratory effort. Lungs are clear to auscultation bilaterally. No wheezes, crackles, or rhonchi.  Gastrointestinal: Soft, ttp over the RLQ, and non distended with positive bowel sounds. No rebound or guarding. Musculoskeletal: Nontender with normal range of motion in all extremities. No edema, cyanosis, or erythema of extremities. Neurologic: Normal speech and language. Face is symmetric. Moving all extremities. No gross focal neurologic deficits are appreciated. Skin: Skin is warm, dry and intact. No rash noted. Psychiatric: Mood and affect are normal. Speech and behavior are normal.  ____________________________________________   LABS (all labs ordered are listed, but only abnormal results are displayed)  Labs Reviewed  CBC WITH DIFFERENTIAL/PLATELET - Abnormal; Notable for the following  components:      Result Value   RBC 5.16 (*)    RDW 16.4 (*)    All other components within normal limits  COMPREHENSIVE METABOLIC PANEL - Abnormal; Notable for the following components:   Glucose, Bld 110 (*)    Total Protein 6.4 (*)    AST 14 (*)    All other components within normal limits  URINALYSIS, COMPLETE (UACMP) WITH MICROSCOPIC - Abnormal; Notable for the following components:   Color, Urine YELLOW (*)    APPearance HAZY (*)    All other components within normal limits  POCT PREGNANCY, URINE - Abnormal; Notable for the following components:   Preg Test, Ur POSITIVE (*)    All other components within normal limits  LIPASE, BLOOD  HCG, QUANTITATIVE, PREGNANCY  ABO/RH   ____________________________________________  EKG  none  ____________________________________________  RADIOLOGY  I have personally reviewed the images  performed during this visit and I agree with the Radiologist's read.   Interpretation by Radiologist:  No results found.    ____________________________________________   PROCEDURES  Procedure(s) performed: None Procedures Critical Care performed:  None ____________________________________________   INITIAL IMPRESSION / ASSESSMENT AND PLAN / ED COURSE  38 y.o. female with history of kidney stones, schizoaffective disorder, asthma, GERD who presents for evaluation of abdominal pain.  Patient is well-appearing and in no distress with normal vital signs, she has tenderness to palpation on the right lower pelvic region with no rebound or guarding.  Pregnancy test is positive.  Ddx ectopic, miscarriage, ovarian pathology, appendicitis. We will do an ultrasound to rule out ectopic or ovarian pathology/torsion.  Will do UA to rule out urinary tract infection. Patient with vaginal spotting, will get ABO to eval for need of Rhogam. Will give tylenol for pain.    _________________________ 11:22 PM on  11/06/2018 -----------------------------------------  Labs within normal limits.  Blood type O+ no indication for RhoGam.  Ultrasound is pending.  Care transferred to Dr. Darnelle CatalanMalinda.   As part of my medical decision making, I reviewed the following data within the electronic MEDICAL RECORD NUMBER Nursing notes reviewed and incorporated, Labs reviewed , Old chart reviewed, Radiograph reviewed , Notes from prior ED visits and [place] Controlled Substance Database    Pertinent labs & imaging results that were available during my care of the patient were reviewed by me and considered in my medical decision making (see chart for details).    ____________________________________________   FINAL CLINICAL IMPRESSION(S) / ED DIAGNOSES  Final diagnoses:  RLQ abdominal pain  Abdominal pain affecting pregnancy  Threatened miscarriage      NEW MEDICATIONS STARTED DURING THIS VISIT:  ED Discharge Orders    None       Note:  This document was prepared using Dragon voice recognition software and may include unintentional dictation errors.    Nita SickleVeronese, [place], MD 11/06/18 [phone]

## 2018-11-06 NOTE — ED Notes (Signed)
Patient transported to Ultrasound 

## 2018-11-06 NOTE — ED Triage Notes (Signed)
Pt in with co RLQ for few days, pt unsure of pregnancy. Pt states she has had light spotting recently but no normal period.

## 2018-11-06 NOTE — Discharge Instructions (Addendum)
Please call Dr. Bonney Aid the OB/GYN doctor.  Let him know that you were seen in the emergency room and need an appointment for follow-up.  He will be waiting for you because I discussed you with him tonight.  Please return here for increasing pain fever vomiting heavy bleeding or any other problems.  Anytime there is any bleeding early in pregnancy recalling a threatened miscarriage.  There is really nothing you can do at this point to change the outcome of the pregnancy although you may want to avoid putting anything in the vagina for the next several days.  If you cannot get in to see OB/GYN in the next couple days and the belly pain continues you can follow-up with his surgeon.

## 2018-11-07 NOTE — ED Provider Notes (Addendum)
Patient's ultrasound shows a 5-week 5-day IUP.  No heart activity or fetal pole is visualized.  Discussed patient with Dr. Bonney Aid he can follow-up.  He thinks appendicitis is unlikely given 3 days of no increase in pain and a normal white count.  He can follow-up with that or she can follow-up for that possibility with surgery I also discussed her with the surgeon as well.  He agrees.  He will can also follow her up for the belly pain although of course not for the pregnancy.  Patient will return here if she is worse with heavier bleeding or any other problems.   Arnaldo Natal, MD 11/07/18 0454    Arnaldo Natal, MD 11/07/18 0114 Patient declines pelvic exam at this point.  I offered it to her said it would be a good idea to have it now she says she just wants to go home and asked if she can have the OB/GYN doctor do it tomorrow or the next day.  She is having no other problems aside from the mild right lower quadrant pain I will go along with this.   Arnaldo Natal, MD 11/07/18 [phone]

## 2018-11-09 ENCOUNTER — Other Ambulatory Visit (HOSPITAL_COMMUNITY)
Admission: RE | Admit: 2018-11-09 | Discharge: 2018-11-09 | Disposition: A | Discharge status: Home / Self Care | Payer: Medicaid Other | Source: Ambulatory Visit | Attending: Obstetrics & Gynecology | Admitting: Obstetrics & Gynecology

## 2018-11-09 ENCOUNTER — Encounter: Payer: Self-pay | Admitting: Obstetrics & Gynecology

## 2018-11-09 ENCOUNTER — Ambulatory Visit (INDEPENDENT_AMBULATORY_CARE_PROVIDER_SITE_OTHER): Payer: Medicaid Other | Admitting: Obstetrics & Gynecology

## 2018-11-09 VITALS — BP 120/80 | Ht 63.0 in | Wt 273.0 lb

## 2018-11-09 DIAGNOSIS — Z124 Encounter for screening for malignant neoplasm of cervix: Secondary | ICD-10-CM

## 2018-11-09 DIAGNOSIS — Z113 Encounter for screening for infections with a predominantly sexual mode of transmission: Secondary | ICD-10-CM

## 2018-11-09 DIAGNOSIS — O200 Threatened abortion: Secondary | ICD-10-CM

## 2018-11-09 NOTE — Progress Notes (Signed)
Chief Complaint  Patient presents with  . Follow-up  . Threatened Miscarriage   History of Present Illness: Patient is a 38 y.o. G1P0 w LMP 09/15/18 presenting for first trimester bleeding.  The onset of bleeding was earlier this week, pink; more so having RLQ pain that is dull, intermittent, mild, no radiation, no modifiers or other assoc sx's..  Is bleeding equal to or greater than normal menstrual flow:  No Any recent trauma:  No Recent intercourse:  No History of prior miscarriage:  No Prior ultrasound demonstrating IUP:  Yes, gest sac on 2/10 Prior ultrasound demonstrating viable IUP:  No Prior Serum HCG:  Yes , 24,000 on 2/10 Rh status: POS Last PAP: >1 year ago No birth control meds/hormones due to side effects when she was young Periods usually 28 d/ 7-9 days heavy and crampy  PMHx: She  has a past medical history of Asthma, Bipolar 1 disorder (HCC), Depression, GERD (gastroesophageal reflux disease), History of kidney stones, Migraines, and Schizo affective schizophrenia (HCC). Also,  has a past surgical history that includes none; Cystoscopy with stent placement (Right, 10/28/2017); Cystoscopy w/ ureteral stent removal (Right, 08/22/2018); Cystoscopy with ureteroscopy (Right, 08/22/2018); Cystoscopy with holmium laser lithotripsy (Right, 08/22/2018); and Cystoscopy w/ retrogrades (08/22/2018)., family history includes Asthma in her mother; Depression in her mother; Diabetes in her brother. She was adopted.,  reports that she has been smoking cigarettes. She has a 3.75 pack-year smoking history. She quit smokeless tobacco use about 15 years ago.  Her smokeless tobacco use included snuff. She reports current alcohol use. She reports current drug use. Frequency: 2.00 times per week. Drugs: Cocaine and Marijuana.  She has a current medication list which includes the following prescription(s): prenatal multivitamin, amoxicillin, multiple vitamins-iron, multiple vitamins-minerals, and  oxybutynin. Also, has No Known Allergies.  Review of Systems  Constitutional: Negative for chills, fever and malaise/fatigue.  HENT: Negative for congestion, sinus pain and sore throat.   Eyes: Negative for blurred vision and pain.  Respiratory: Negative for cough and wheezing.   Cardiovascular: Negative for chest pain and leg swelling.  Gastrointestinal: Negative for abdominal pain, constipation, diarrhea, heartburn, nausea and vomiting.  Genitourinary: Negative for dysuria, frequency, hematuria and urgency.  Musculoskeletal: Negative for back pain, joint pain, myalgias and neck pain.  Skin: Negative for itching and rash.  Neurological: Negative for dizziness, tremors and weakness.  Endo/Heme/Allergies: Does not bruise/bleed easily.  Psychiatric/Behavioral: Negative for depression. The patient is not nervous/anxious and does not have insomnia.     Objective: Vitals:   11/09/18 0918  BP: 120/80   Physical Exam Constitutional:      General: She is not in acute distress.    Appearance: She is well-developed.  Genitourinary:     Pelvic exam was performed with patient supine.     Vagina, uterus and rectum normal.     No lesions in the vagina.     No vaginal bleeding.     No cervical motion tenderness, friability, lesion or polyp.     Uterus is mobile.     Uterus is not enlarged.     No uterine mass detected.    Uterus is midaxial.     No right or left adnexal mass present.     Right adnexa not tender.     Left adnexa not tender.     Genitourinary Comments: No blood Cervix closed, thick No adnexal mass, small anterior fibroid/midline on uterus that is midposition  HENT:     Head: Normocephalic and  atraumatic. No laceration.     Right Ear: Hearing normal.     Left Ear: Hearing normal.     Mouth/Throat:     Pharynx: Uvula midline.  Eyes:     Pupils: Pupils are equal, round, and reactive to light.  Neck:     Musculoskeletal: Normal range of motion and neck supple.      Thyroid: No thyromegaly.  Cardiovascular:     Rate and Rhythm: Normal rate and regular rhythm.     Heart sounds: No murmur. No friction rub. No gallop.   Pulmonary:     Effort: Pulmonary effort is normal. No respiratory distress.     Breath sounds: Normal breath sounds. No wheezing.  Chest:     Breasts:        Right: No mass, skin change or tenderness.        Left: No mass, skin change or tenderness.  Abdominal:     General: Bowel sounds are normal. There is no distension.     Palpations: Abdomen is soft.     Tenderness: There is no abdominal tenderness. There is no rebound.  Musculoskeletal: Normal range of motion.  Neurological:     Mental Status: She is alert and oriented to person, place, and time.     Cranial Nerves: No cranial nerve deficit.  Skin:    General: Skin is warm and dry.  Psychiatric:        Judgment: Judgment normal.  Vitals signs reviewed.    Assessment: 38 y.o. G1P0 Unknown 1. Screening for cervical cancer Due - Cytology - PAP  2. Screen for STD (sexually transmitted disease) Prenatal test - Cytology - PAP  3. Threatened abortion Monitor sx's, follow up US  Plan: 1) First trimester bleeding - incidence and clinical course of first trimester bleeding is discussed in detail with the patient today.  Approximately 1/3 of pregnancies ending in live births experienced 1st trimester bleeding.  The amount of bleeding is variable and not necessarily predictive of outcome.  Sources may be cervical or uterine.  Subchorionic hemorrhages are a frequent concurrent findings on ultrasound and are followed expectantly.  These often absorb or regress spontaneously although risk for expansion and further disruption of the utero-placental interface leading to miscarriage is possible.  There is no clearly documented benefit to limiting or modifying activity and sexual intercourse in altering clinic course of 1st trimester bleeding.    2) If not already done will proceed with  TVUS evaluation to document viability, and if uncertain viability or absence of a demonstrable IUP (and no previous documentation of IUP) will trend HCG levels.  3) The patient is Rh POS, rhogam is therefore not indicated to decrease the risk rhesus alloimmunization.    4) Routine bleeding precautions were discussed with the patient prior the conclusion of today's visit.  Annamarie Major, MD, [facility], [facility] 11/09/2018  9:43 AM

## 2018-11-09 NOTE — Patient Instructions (Signed)
Threatened Miscarriage  A threatened miscarriage occurs when a woman has vaginal bleeding during the first 20 weeks of pregnancy but the pregnancy has not ended. If you have vaginal bleeding during this time, your health care provider will do tests to make sure you are still pregnant. If the tests show that you are still pregnant and that the developing baby (fetus) inside your uterus is still growing, your condition is considered a threatened miscarriage. A threatened miscarriage does not mean your pregnancy will end, but it does increase the risk of losing your pregnancy (complete miscarriage). What are the causes? The cause of this condition is usually not known. For women who go on to have a complete miscarriage, the most common cause is an abnormal number of chromosomes in the developing baby. Chromosomes are the structures inside cells that hold all of a person's genetic material. What increases the risk? The following lifestyle factors may increase your risk of a miscarriage in early pregnancy:  Smoking.  Drinking excessive amounts of alcohol or caffeine.  Recreational drug use. The following preexisting health conditions may increase your risk of a miscarriage in early pregnancy:  Polycystic ovary syndrome.  Uterine fibroids.  Infections.  Diabetes mellitus. What are the signs or symptoms? Symptoms of this condition include:  Vaginal bleeding.  Mild abdominal pain or cramps. How is this diagnosed? If you have bleeding with or without abdominal pain before 20 weeks of pregnancy, your health care provider will do tests to check whether you are still pregnant. These will include:  Ultrasound. This test uses sound waves to create images of the inside of your uterus. This allows your health care provider to look at your developing baby and other structures, such as your placenta.  Pelvic exam. This is an internal exam of your vagina and cervix.  Measurement of your baby's heart  rate.  Laboratory tests such as blood tests, urine tests, or swabs for infection You may be diagnosed with a threatened miscarriage if:  Ultrasound testing shows that you are still pregnant.  Your baby's heart rate is strong.  A pelvic exam shows that the opening between your uterus and your vagina (cervix) is closed.  Blood tests confirm that you are still pregnant. How is this treated? No treatments have been shown to prevent a threatened miscarriage from going on to a complete miscarriage. However, the right home care is important. Follow these instructions at home:  Get plenty of rest.  Do not have sex or use tampons if you have vaginal bleeding.  Do not douche.  Do not smoke or use recreational drugs.  Do not drink alcohol.  Avoid caffeine.  Keep all follow-up prenatal visits as told by your health care provider. This is important. Contact a health care provider if:  You have light vaginal bleeding or spotting while pregnant.  You have abdominal pain or cramping.  You have a fever. Get help right away if:  You have heavy vaginal bleeding.  You have blood clots coming from your vagina.  You pass tissue from your vagina.  You leak fluid, or you have a gush of fluid from your vagina.  You have severe low back pain or abdominal cramps.  You have fever, chills, and severe abdominal pain. Summary  A threatened miscarriage occurs when a woman has vaginal bleeding during the first 20 weeks of pregnancy but the pregnancy has not ended.  The cause of a threatened miscarriage is usually not known.  Symptoms of this condition may   include vaginal bleeding and mild abdominal pain or cramps.  No treatments have been shown to prevent a threatened miscarriage from going on to a complete miscarriage.  Keep all follow-up prenatal visits as told by your health care provider. This is important. This information is not intended to replace advice given to you by your health  care provider. Make sure you discuss any questions you have with your health care provider. Document Released: 09/13/2005 Document Revised: 12/10/2016 Document Reviewed: 12/10/2016 Elsevier Interactive Patient Education  2019 Elsevier Inc[employer]  

## 2018-11-10 ENCOUNTER — Other Ambulatory Visit: Payer: Self-pay | Admitting: Obstetrics & Gynecology

## 2018-11-10 DIAGNOSIS — Z0189 Encounter for other specified special examinations: Secondary | ICD-10-CM

## 2018-11-13 ENCOUNTER — Ambulatory Visit (INDEPENDENT_AMBULATORY_CARE_PROVIDER_SITE_OTHER): Payer: Medicaid Other | Admitting: Obstetrics & Gynecology

## 2018-11-13 ENCOUNTER — Encounter: Payer: Self-pay | Admitting: Obstetrics & Gynecology

## 2018-11-13 ENCOUNTER — Ambulatory Visit (INDEPENDENT_AMBULATORY_CARE_PROVIDER_SITE_OTHER): Payer: Self-pay

## 2018-11-13 VITALS — BP 120/80 | Ht 63.0 in | Wt 278.0 lb

## 2018-11-13 DIAGNOSIS — O3481 Maternal care for other abnormalities of pelvic organs, first trimester: Secondary | ICD-10-CM

## 2018-11-13 DIAGNOSIS — Z3A01 Less than 8 weeks gestation of pregnancy: Secondary | ICD-10-CM

## 2018-11-13 DIAGNOSIS — O200 Threatened abortion: Secondary | ICD-10-CM

## 2018-11-13 DIAGNOSIS — R87611 Atypical squamous cells cannot exclude high grade squamous intraepithelial lesion on cytologic smear of cervix (ASC-H): Secondary | ICD-10-CM

## 2018-11-13 DIAGNOSIS — Z0189 Encounter for other specified special examinations: Secondary | ICD-10-CM

## 2018-11-13 DIAGNOSIS — N8311 Corpus luteum cyst of right ovary: Secondary | ICD-10-CM

## 2018-11-13 LAB — CYTOLOGY - PAP
Chlamydia: NEGATIVE
HPV (WINDOPATH): NOT DETECTED
Neisseria Gonorrhea: NEGATIVE

## 2018-11-13 NOTE — Progress Notes (Signed)
  HPI: Threatened Abortion Patient at 48 and 5/[redacted] weeks gestation by best clinical estimate, based on gest sac measurement a week ago.  Patient reports bleeding early, since has stopped, cramping too.  She is not in acute distress.  Ectopic risks include none. Cycle length is regular. Pregnancy testing: quant HCG level 24,000 on 2/10. Pregnancy imaging: transvaginal ultrasound done on 2/10; again today. Result Gest sac 5 5/7 a week ago.  Blood type: Rh POS.  Other lab results: none.  Ultrasound demonstrates Gest sac and no fetal pole, measuring 6 0/7 weeks, also blood clot or hemorrhage within These findings are concerning for threatened Ab  PMHx: She  has a past medical history of Asthma, Bipolar 1 disorder (HCC), Depression, GERD (gastroesophageal reflux disease), History of kidney stones, Migraines, and Schizo affective schizophrenia (HCC). Also,  has a past surgical history that includes none; Cystoscopy with stent placement (Right, 10/28/2017); Cystoscopy w/ ureteral stent removal (Right, 08/22/2018); Cystoscopy with ureteroscopy (Right, 08/22/2018); Cystoscopy with holmium laser lithotripsy (Right, 08/22/2018); and Cystoscopy w/ retrogrades (08/22/2018)., family history includes Asthma in her mother; Depression in her mother; Diabetes in her brother. She was adopted.,  reports that she has been smoking cigarettes. She has a 3.75 pack-year smoking history. She quit smokeless tobacco use about 15 years ago.  Her smokeless tobacco use included snuff. She reports current alcohol use. She reports current drug use. Frequency: 2.00 times per week. Drugs: Cocaine and Marijuana.  She has a current medication list which includes the following prescription(s): prenatal multivitamin, amoxicillin, multiple vitamins-iron, multiple vitamins-minerals, and oxybutynin. Also, has No Known Allergies.  Review of Systems  All other systems reviewed and are negative.  Objective: BP 120/80   Ht 5\' 3"  (1.6 m)   Wt 278 lb  (126.1 kg)   LMP 09/15/2018   BMI 49.25 kg/m   Physical examination Constitutional NAD, Conversant  Skin No rashes, lesions or ulceration.   Extremities: Moves all appropriately.  Normal ROM for age. No lymphadenopathy.  Neuro: Grossly intact  Psych: Oriented to PPT.  Normal mood. Normal affect.   Assessment:  1  Threatened abortion    Monitor for s/sx miscarriage    F/u 2 weeks    D&C option discussed, vs exp mgt  2  Atypical squamous cells cannot exclude high grade squamous intraepithelial lesion on cytologic smear of cervix (ASC-H)    Colpo scheduled after this resolves (in 4 weeks)  Annamarie Major, MD, [facility], [facility] 11/13/2018  3:39 PM

## 2018-11-29 ENCOUNTER — Ambulatory Visit: Payer: Medicaid Other | Admitting: Obstetrics & Gynecology

## 2018-12-03 ENCOUNTER — Emergency Department: Payer: Medicaid Other

## 2018-12-03 ENCOUNTER — Other Ambulatory Visit: Payer: Self-pay

## 2018-12-03 ENCOUNTER — Emergency Department
Admission: EM | Admit: 2018-12-03 | Discharge: 2018-12-03 | Disposition: A | Discharge status: Home / Self Care | Payer: Medicaid Other | Attending: Emergency Medicine | Admitting: Emergency Medicine

## 2018-12-03 DIAGNOSIS — Y998 Other external cause status: Secondary | ICD-10-CM | POA: Insufficient documentation

## 2018-12-03 DIAGNOSIS — O4690 Antepartum hemorrhage, unspecified, unspecified trimester: Secondary | ICD-10-CM

## 2018-12-03 DIAGNOSIS — O039 Complete or unspecified spontaneous abortion without complication: Secondary | ICD-10-CM | POA: Diagnosis not present

## 2018-12-03 DIAGNOSIS — O9A211 Injury, poisoning and certain other consequences of external causes complicating pregnancy, first trimester: Secondary | ICD-10-CM | POA: Insufficient documentation

## 2018-12-03 DIAGNOSIS — M549 Dorsalgia, unspecified: Secondary | ICD-10-CM | POA: Insufficient documentation

## 2018-12-03 DIAGNOSIS — Z3A09 9 weeks gestation of pregnancy: Secondary | ICD-10-CM | POA: Insufficient documentation

## 2018-12-03 DIAGNOSIS — M542 Cervicalgia: Secondary | ICD-10-CM | POA: Diagnosis not present

## 2018-12-03 DIAGNOSIS — O209 Hemorrhage in early pregnancy, unspecified: Secondary | ICD-10-CM | POA: Diagnosis not present

## 2018-12-03 DIAGNOSIS — Y92410 Unspecified street and highway as the place of occurrence of the external cause: Secondary | ICD-10-CM | POA: Diagnosis not present

## 2018-12-03 DIAGNOSIS — R109 Unspecified abdominal pain: Secondary | ICD-10-CM | POA: Insufficient documentation

## 2018-12-03 DIAGNOSIS — O9989 Other specified diseases and conditions complicating pregnancy, childbirth and the puerperium: Secondary | ICD-10-CM | POA: Insufficient documentation

## 2018-12-03 DIAGNOSIS — S39012A Strain of muscle, fascia and tendon of lower back, initial encounter: Secondary | ICD-10-CM

## 2018-12-03 DIAGNOSIS — S301XXA Contusion of abdominal wall, initial encounter: Secondary | ICD-10-CM | POA: Insufficient documentation

## 2018-12-03 DIAGNOSIS — O99331 Smoking (tobacco) complicating pregnancy, first trimester: Secondary | ICD-10-CM | POA: Insufficient documentation

## 2018-12-03 DIAGNOSIS — Y9389 Activity, other specified: Secondary | ICD-10-CM | POA: Diagnosis not present

## 2018-12-03 DIAGNOSIS — F1410 Cocaine abuse, uncomplicated: Secondary | ICD-10-CM | POA: Insufficient documentation

## 2018-12-03 DIAGNOSIS — F1210 Cannabis abuse, uncomplicated: Secondary | ICD-10-CM | POA: Diagnosis not present

## 2018-12-03 DIAGNOSIS — F17210 Nicotine dependence, cigarettes, uncomplicated: Secondary | ICD-10-CM | POA: Diagnosis not present

## 2018-12-03 DIAGNOSIS — M25561 Pain in right knee: Secondary | ICD-10-CM | POA: Insufficient documentation

## 2018-12-03 DIAGNOSIS — S3011XA Contusion of abdominal wall, initial encounter: Secondary | ICD-10-CM

## 2018-12-03 DIAGNOSIS — M25562 Pain in left knee: Secondary | ICD-10-CM | POA: Diagnosis not present

## 2018-12-03 LAB — CBC WITH DIFFERENTIAL/PLATELET
Abs Immature Granulocytes: 0.02 10*3/uL (ref 0.00–0.07)
BASOS ABS: 0.1 10*3/uL (ref 0.0–0.1)
Basophils Relative: 1 %
Eosinophils Absolute: 0.3 10*3/uL (ref 0.0–0.5)
Eosinophils Relative: 4 %
HCT: 44.4 % (ref 36.0–46.0)
Hemoglobin: 14 g/dL (ref 12.0–15.0)
Immature Granulocytes: 0 %
Lymphocytes Relative: 29 %
Lymphs Abs: 1.8 10*3/uL (ref 0.7–4.0)
MCH: 26.4 pg (ref 26.0–34.0)
MCHC: 31.5 g/dL (ref 30.0–36.0)
MCV: 83.8 fL (ref 80.0–100.0)
Monocytes Absolute: 0.3 10*3/uL (ref 0.1–1.0)
Monocytes Relative: 4 %
NRBC: 0 % (ref 0.0–0.2)
Neutro Abs: 3.9 10*3/uL (ref 1.7–7.7)
Neutrophils Relative %: 62 %
Platelets: 307 10*3/uL (ref 150–400)
RBC: 5.3 MIL/uL — ABNORMAL HIGH (ref 3.87–5.11)
RDW: 15.2 % (ref 11.5–15.5)
WBC: 6.3 10*3/uL (ref 4.0–10.5)

## 2018-12-03 LAB — ABO/RH: ABO/RH(D): A POS

## 2018-12-03 LAB — HCG, QUANTITATIVE, PREGNANCY: hCG, Beta Chain, Quant, S: 20 m[IU]/mL — ABNORMAL HIGH (ref <5)

## 2018-12-03 MED ORDER — CYCLOBENZAPRINE HCL 10 MG PO TABS: 10.0000 mg | ORAL_TABLET | Freq: Three times a day (TID) | ORAL | 0 refills | Status: DC | PRN

## 2018-12-03 MED ORDER — ACETAMINOPHEN 325 MG PO TABS
650.0000 mg | ORAL_TABLET | Freq: Once | ORAL | Status: AC
  Administered 2018-12-03: 650 mg via ORAL
  Filled 2018-12-03: qty 2

## 2018-12-03 MED ORDER — IBUPROFEN 600 MG PO TABS: 600.0000 mg | ORAL_TABLET | Freq: Four times a day (QID) | ORAL | 0 refills | Status: DC | PRN

## 2018-12-03 NOTE — Discharge Instructions (Signed)
You can take the ibuprofen as needed up to every 6 hours for pain, and the Flexeril if needed for muscle cramps or spasms especially at night.  Call [facility] tomorrow to arrange for follow-up.  Return to the ER for new or worsening pain, bleeding, vomiting, weakness, or any other new or worsening symptoms that concern you.
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
## 2018-12-03 NOTE — ED Notes (Signed)
Phone report to Dewayne Hatch, Charity fundraiser

## 2018-12-03 NOTE — ED Notes (Signed)
Patient [id number] ,  NAD, Vitals stable.
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
## 2018-12-06 ENCOUNTER — Ambulatory Visit
Admission: RE | Admit: 2018-12-06 | Discharge: 2018-12-06 | Disposition: A | Discharge status: Home / Self Care | Payer: Medicaid Other | Source: Ambulatory Visit | Attending: Chiropractor | Admitting: Chiropractor

## 2018-12-06 ENCOUNTER — Other Ambulatory Visit: Payer: Self-pay | Admitting: Chiropractor

## 2018-12-06 DIAGNOSIS — R51 Headache: Secondary | ICD-10-CM | POA: Diagnosis not present

## 2018-12-11 ENCOUNTER — Other Ambulatory Visit: Payer: Self-pay

## 2018-12-11 ENCOUNTER — Other Ambulatory Visit (HOSPITAL_COMMUNITY)
Admission: RE | Admit: 2018-12-11 | Discharge: 2018-12-11 | Disposition: A | Discharge status: Home / Self Care | Payer: Medicaid Other | Source: Ambulatory Visit | Attending: Obstetrics & Gynecology | Admitting: Obstetrics & Gynecology

## 2018-12-11 ENCOUNTER — Encounter: Payer: Self-pay | Admitting: Obstetrics & Gynecology

## 2018-12-11 ENCOUNTER — Ambulatory Visit (INDEPENDENT_AMBULATORY_CARE_PROVIDER_SITE_OTHER): Payer: Medicaid Other | Admitting: Obstetrics & Gynecology

## 2018-12-11 VITALS — BP 120/80 | Ht 63.0 in | Wt 270.0 lb

## 2018-12-11 DIAGNOSIS — R87611 Atypical squamous cells cannot exclude high grade squamous intraepithelial lesion on cytologic smear of cervix (ASC-H): Secondary | ICD-10-CM | POA: Diagnosis not present

## 2018-12-11 DIAGNOSIS — N870 Mild cervical dysplasia: Secondary | ICD-10-CM

## 2018-12-11 NOTE — Progress Notes (Signed)
Referring Provider:  none  HPI:  Amy Pennington is a 38 y.o.  G1P0  who presents today for evaluation and management of abnormal cervical cytology.    Dysplasia History:  ASC-H recently here Prior LGSIL and prior colpo years ago  ROS:  Pertinent items are noted in HPI.  OB History  Gravida Para Term Preterm AB Living  1            SAB TAB Ectopic Multiple Live Births               # Outcome Date GA Lbr Len/2nd Weight Sex Delivery Anes PTL Lv  1 Gravida             Past Medical History:  Diagnosis Date  . Asthma   . Bipolar 1 disorder (HCC)   . Depression   . GERD (gastroesophageal reflux disease)   . History of kidney stones   . Migraines   . Schizo affective schizophrenia [facility])     Past Surgical History:  Procedure Laterality Date  . CYSTOSCOPY W/ RETROGRADES  08/22/2018   Procedure: CYSTOSCOPY WITH RETROGRADE PYELOGRAM;  Surgeon: Riki Altes, MD;  Location: [facility] ORS;  Service: Urology;;  . Bluford Kaufmann W/ URETERAL STENT REMOVAL Right 08/22/2018   Procedure: CYSTOSCOPY WITH STENT REMOVAL;  Surgeon: Riki Altes, MD;  Location: [facility] ORS;  Service: Urology;  Laterality: Right;  . CYSTOSCOPY WITH HOLMIUM LASER LITHOTRIPSY Right 08/22/2018   Procedure: CYSTOSCOPY WITH HOLMIUM LASER LITHOTRIPSY;  Surgeon: Riki Altes, MD;  Location: [facility] ORS;  Service: Urology;  Laterality: Right;  . CYSTOSCOPY WITH STENT PLACEMENT Right 10/28/2017   Procedure: CYSTOSCOPY WITH right ureteral STENT PLACEMENT;  Surgeon: Riki Altes, MD;  Location: [facility] ORS;  Service: Urology;  Laterality: Right;  . CYSTOSCOPY WITH URETEROSCOPY Right 08/22/2018   Procedure: CYSTOSCOPY WITH URETEROSCOPY;  Surgeon: Riki Altes, MD;  Location: [facility] ORS;  Service: Urology;  Laterality: Right;  . none      SOCIAL HISTORY:  Social History   Substance and Sexual Activity  Alcohol Use Yes   Comment: occas    Social History   Substance and Sexual Activity  Drug Use Yes  . Frequency:  2.0 times per week  . Types: Cocaine, Marijuana   Comment: Cocaine and Marijuana once or twice a week     Family History  Adopted: Yes  Problem Relation Age of Onset  . Asthma Mother   . Depression Mother   . Diabetes Brother     ALLERGIES:  Patient has no known allergies.  Current Outpatient Medications on File Prior to Visit  Medication Sig Dispense Refill  . cyclobenzaprine (FLEXERIL) 10 MG tablet Take 1 tablet (10 mg total) by mouth 3 (three) times daily as needed for muscle spasms. 15 tablet 0  . Multiple Vitamins-Minerals (HAIR SKIN NAILS PO) Take 2 tablets by mouth daily.    . Prenatal Vit-Fe Fumarate-FA (PRENATAL MULTIVITAMIN) TABS tablet Take 1 tablet by mouth daily at 12 noon.    Marland Kitchen amoxicillin (AMOXIL) 875 MG tablet Take 1 tablet (875 mg total) by mouth every 12 (twelve) hours. (Patient not taking: Reported on 12/11/2018) 14 tablet 0  . ibuprofen (ADVIL,MOTRIN) 600 MG tablet Take 1 tablet (600 mg total) by mouth every 6 (six) hours as needed. (Patient not taking: Reported on 12/11/2018) 30 tablet 0  . Multiple Vitamins-Iron (MULTIVITAMIN/IRON PO) Take 1 tablet by mouth daily.    Marland Kitchen oxybutynin (DITROPAN) 5 MG tablet 1 tab tid prn frequency,urgency,  bladder spasm (Patient not taking: Reported on 12/11/2018) 30 tablet 0   No current facility-administered medications on file prior to visit.     Physical Exam: -Vitals:  BP 120/80   Ht 5\' 3"  (1.6 m)   Wt 270 lb (122.5 kg)   BMI 47.83 kg/m  GEN: WD, WN, NAD.  A+ O x 3, good mood and affect. ABD:  NT, ND.  Soft, no masses.  No hernias noted.   Pelvic:   Vulva: Normal appearance.  No lesions.  Vagina: No lesions or abnormalities noted.  Support: Normal pelvic support.  Urethra No masses tenderness or scarring.  Meatus Normal size without lesions or prolapse.  Cervix: See below.  Anus: Normal exam.  No lesions.  Perineum: Normal exam.  No lesions.        Bimanual   Uterus: Normal size.  Non-tender.  Mobile.  AV.  Adnexae:  No masses.  Non-tender to palpation.  Cul-de-sac: Negative for abnormality.   PROCEDURE: 1.  Urine Pregnancy Test:  negative 2.  Colposcopy performed with 4% acetic acid after verbal consent obtained                                         -Aceto-white Lesions Location(s): 12 o'clock.              -Biopsy performed at 12, 6 o'clock               -ECC indicated and performed: Yes.       -Biopsy sites made hemostatic with pressure, AgNO3, and/or Monsel's solution   -Satisfactory colposcopy: Yes.      -Evidence of Invasive cervical CA :  NO  ASSESSMENT:  Amy Pennington is a 38 y.o. G1P0 here for  1. Atypical squamous cells cannot exclude high grade squamous intraepithelial lesion on cytologic smear of cervix (ASC-H)   .  PLAN: 1.  I discussed the grading system of pap smears and HPV high risk viral types.  We will discuss and base management after colpo results return. 2. Follow up PAP 6 months, vs intervention if high grade dysplasia identified 3. Treatment of persistantly abnormal PAP smears and cervical dysplasia, even mild, is discussed w pt today in detail, as well as the pros and cons of Cryo and LEEP procedures. Will consider and discuss after results.      Annamarie Major, MD, [facility], [facility] 12/11/2018  3:45 PM

## 2018-12-11 NOTE — Patient Instructions (Signed)
Colposcopy, Care After  This sheet gives you information about how to care for yourself after your procedure. Your health care provider may also give you more specific instructions. If you have problems or questions, contact your health care provider.  What can I expect after the procedure?  If you had a colposcopy without a biopsy, you can expect to feel fine right away, but you may have some spotting for a few days. You can go back to your normal activities.  If you had a colposcopy with a biopsy, it is common to have:   Soreness and pain. This may last for a few days.   Light-headedness.   Mild vaginal bleeding or dark-colored, grainy discharge. This may last for a few days. The discharge may be due to a solution that was used during the procedure. You may need to wear a sanitary pad during this time.   Spotting for at least 48 hours after the procedure.  Follow these instructions at home:     Take over-the-counter and prescription medicines only as told by your health care provider. Talk with your health care provider about what type of over-the-counter pain medicine and prescription medicine you can start taking again. It is especially important to talk with your health care provider if you take blood-thinning medicine.   Do not drive or use heavy machinery while taking prescription pain medicine.   For at least 3 days after your procedure, or as long as told by your health care provider, avoid:  ? Douching.  ? Using tampons.  ? Having sexual intercourse.   Continue to use birth control (contraception).   Limit your physical activity for the first day after the procedure as told by your health care provider. Ask your health care provider what activities are safe for you.   It is up to you to get the results of your procedure. Ask your health care provider, or the department performing the procedure, when your results will be ready.   Keep all follow-up visits as told by your health care provider.  This is important.  Contact a health care provider if:   You develop a skin rash.  Get help right away if:   You are bleeding heavily from your vagina or you are passing blood clots. This includes using more than one sanitary pad per hour for 2 hours in a row.   You have a fever or chills.   You have pelvic pain.   You have abnormal, yellow-colored, or bad-smelling vaginal discharge. This could be a sign of infection.   You have severe pain or cramps in your lower abdomen that do not get better with medicine.   You feel light-headed or dizzy, or you faint.  Summary   If you had a colposcopy without a biopsy, you can expect to feel fine right away, but you may have some spotting for a few days. You can go back to your normal activities.   If you had a colposcopy with a biopsy, you may notice mild pain and spotting for 48 hours after the procedure.   Avoid douching, using tampons, and having sexual intercourse for 3 days after the procedure or as long as told by your health care provider.   Contact your health care provider if you have bleeding, severe pain, or signs of infection.  This information is not intended to replace advice given to you by your health care provider. Make sure you discuss any questions you have with your   health care provider.  Document Released: 07/04/2013 Document Revised: 04/30/2016 Document Reviewed: 04/30/2016  Elsevier Interactive Patient Education  2019 [employer]

## 2018-12-14 NOTE — Progress Notes (Signed)
ASC-US PAP, CIN I Colpo/Bx. Plan PAP 6 mos. LM

## 2018-12-19 NOTE — Progress Notes (Signed)
LM again.

## 2018-12-26 NOTE — Progress Notes (Signed)
LM again.

## 2019-05-09 ENCOUNTER — Telehealth: Payer: Self-pay | Admitting: Pharmacy Technician

## 2019-05-09 NOTE — Telephone Encounter (Signed)
Patient failed to provide2020 financial documentation. No additional medication assistance will be provided by [facility] without the required proof of income documentation. Patient notified by letter.  Vaani Morren, CPhT [facility] 

## 2019-06-13 ENCOUNTER — Ambulatory Visit: Payer: Medicaid Other | Admitting: Obstetrics & Gynecology
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
## 2019-06-21 ENCOUNTER — Other Ambulatory Visit (HOSPITAL_COMMUNITY)
Admission: RE | Admit: 2019-06-21 | Discharge: 2019-06-21 | Disposition: A | Discharge status: Home / Self Care | Payer: Medicaid Other | Source: Ambulatory Visit | Attending: Obstetrics & Gynecology | Admitting: Obstetrics & Gynecology

## 2019-06-21 ENCOUNTER — Encounter: Payer: Self-pay | Admitting: Obstetrics & Gynecology

## 2019-06-21 ENCOUNTER — Ambulatory Visit (INDEPENDENT_AMBULATORY_CARE_PROVIDER_SITE_OTHER): Payer: Medicaid Other | Admitting: Obstetrics & Gynecology

## 2019-06-21 ENCOUNTER — Other Ambulatory Visit: Payer: Self-pay

## 2019-06-21 VITALS — BP 120/70 | Ht 63.0 in | Wt 296.0 lb

## 2019-06-21 DIAGNOSIS — N3001 Acute cystitis with hematuria: Secondary | ICD-10-CM

## 2019-06-21 DIAGNOSIS — R300 Dysuria: Secondary | ICD-10-CM | POA: Diagnosis not present

## 2019-06-21 DIAGNOSIS — N870 Mild cervical dysplasia: Secondary | ICD-10-CM

## 2019-06-21 LAB — POCT URINALYSIS DIPSTICK
Bilirubin, UA: NEGATIVE
Blood, UA: POSITIVE
Glucose, UA: NEGATIVE
Nitrite, UA: POSITIVE
Protein, UA: NEGATIVE
Spec Grav, UA: 1.01 (ref 1.010–1.025)
Urobilinogen, UA: 0.2 E.U./dL
pH, UA: 5 (ref 5.0–8.0)

## 2019-06-21 MED ORDER — SULFAMETHOXAZOLE-TRIMETHOPRIM 800-160 MG PO TABS: 1.0000 | ORAL_TABLET | Freq: Two times a day (BID) | ORAL | 0 refills | Status: AC

## 2019-06-21 NOTE — Progress Notes (Signed)
HPI:  Patient is a 38 y.o. G1P0 presenting for follow up evaluation of abnormal PAP smear in the past.  Her last PAP was 7 months ago and was abnormal: ASC-H. She has had a prior colposcopy 6 months ago. Prior biopsies (if done) were CIN I.  Also, pt c/o 2 day dysuria and pelvic/suprapubic discomfort.  No back pian or fever.  PMHx: She  has a past medical history of Asthma, Bipolar 1 disorder (HCC), Depression, GERD (gastroesophageal reflux disease), History of kidney stones, Migraines, and Schizo affective schizophrenia (HCC). Also,  has a past surgical history that includes none; Cystoscopy with stent placement (Right, 10/28/2017); Cystoscopy w/ ureteral stent removal (Right, 08/22/2018); Cystoscopy with ureteroscopy (Right, 08/22/2018); Cystoscopy with holmium laser lithotripsy (Right, 08/22/2018); and Cystoscopy w/ retrogrades (08/22/2018)., family history includes Asthma in her mother; Depression in her mother; Diabetes in her brother. She was adopted.,  reports that she has been smoking cigarettes. She has a 3.75 pack-year smoking history. She quit smokeless tobacco use about 15 years ago.  Her smokeless tobacco use included snuff. She reports current alcohol use. She reports current drug use. Frequency: 2.00 times per week. Drugs: Cocaine and Marijuana.  She has a current medication list which includes the following prescription(s): amoxicillin, cyclobenzaprine, ibuprofen, multiple vitamins-iron, multiple vitamins-minerals, oxybutynin, prenatal multivitamin, and sulfamethoxazole-trimethoprim. Also, has No Known Allergies.  Review of Systems  Genitourinary: Positive for frequency.  All other systems reviewed and are negative.   Objective: BP 120/70   Ht 5\' 3"  (1.6 m)   Wt 296 lb (134.3 kg)   LMP 06/05/2019   BMI 52.43 kg/m  Filed Weights   06/21/19 1440  Weight: 296 lb (134.3 kg)   Body mass index is 52.43 kg/m.  Physical examination Physical Exam Constitutional:      General:  She is not in acute distress.    Appearance: She is well-developed.  Genitourinary:     Pelvic exam was performed with patient supine.     Vagina and uterus normal.     No vaginal erythema or bleeding.     No cervical motion tenderness, discharge, polyp or nabothian cyst.     Uterus is mobile.     Uterus is not enlarged.     No uterine mass detected.    Uterus is midaxial.     No right or left adnexal mass present.     Right adnexa not tender.     Left adnexa not tender.  HENT:     Head: Normocephalic and atraumatic.     Nose: Nose normal.  Abdominal:     General: There is no distension.     Palpations: Abdomen is soft.     Tenderness: There is no abdominal tenderness.  Musculoskeletal: Normal range of motion.  Neurological:     Mental Status: She is alert and oriented to person, place, and time.     Cranial Nerves: No cranial nerve deficit.  Skin:    General: Skin is warm and dry.  Psychiatric:        Attention and Perception: Attention normal.        Mood and Affect: Mood and affect normal.        Speech: Speech normal.        Behavior: Behavior normal.        Thought Content: Thought content normal.        Judgment: Judgment normal.     ASSESSMENT:  History of Cervical Dysplasia CIN I UTI  Plan:  1.  I discussed the grading system of pap smears and HPV high risk viral types.   2. Follow up PAP 6 months, vs intervention if high grade dysplasia identified. 3. Treatment of persistantly abnormal PAP smears and cervical dysplasia, even mild, is discussed w pt today in detail, as well as the pros and cons of Cryo and LEEP procedures. Will consider and discuss after results. 4. UTI, send culture and tx w Bactrim  Barnett Applebaum, MD, [facility], [facility] 06/21/2019  3:06 PM

## 2019-06-21 NOTE — Patient Instructions (Signed)
Urinary Tract Infection, Adult A urinary tract infection (UTI) is an infection of any part of the urinary tract. The urinary tract includes:  The kidneys.  The ureters.  The bladder.  The urethra. These organs make, store, and get rid of pee (urine) in the body. What are the causes? This is caused by germs (bacteria) in your genital area. These germs grow and cause swelling (inflammation) of your urinary tract. What increases the risk? You are more likely to develop this condition if:  You have a small, thin tube (catheter) to drain pee.  You cannot control when you pee or poop (incontinence).  You are female, and: ? You use these methods to prevent pregnancy: ? A medicine that kills sperm (spermicide). ? A device that blocks sperm (diaphragm). ? You have low levels of a female hormone (estrogen). ? You are pregnant.  You have genes that add to your risk.  You are sexually active.  You take antibiotic medicines.  You have trouble peeing because of: ? A prostate that is bigger than normal, if you are female. ? A blockage in the part of your body that drains pee from the bladder (urethra). ? A kidney stone. ? A nerve condition that affects your bladder (neurogenic bladder). ? Not getting enough to drink. ? Not peeing often enough.  You have other conditions, such as: ? Diabetes. ? A weak disease-fighting system (immune system). ? Sickle cell disease. ? Gout. ? Injury of the spine. What are the signs or symptoms? Symptoms of this condition include:  Needing to pee right away (urgently).  Peeing often.  Peeing small amounts often.  Pain or burning when peeing.  Blood in the pee.  Pee that smells bad or not like normal.  Trouble peeing.  Pee that is cloudy.  Fluid coming from the vagina, if you are female.  Pain in the belly or lower back. Other symptoms include:  Throwing up (vomiting).  No urge to eat.  Feeling mixed up (confused).  Being tired  and grouchy (irritable).  A fever.  Watery poop (diarrhea). How is this treated? This condition may be treated with:  Antibiotic medicine.  Other medicines.  Drinking enough water. Follow these instructions at home:  Medicines  Take over-the-counter and prescription medicines only as told by your doctor.  If you were prescribed an antibiotic medicine, take it as told by your doctor. Do not stop taking it even if you start to feel better. General instructions  Make sure you: ? Pee until your bladder is empty. ? Do not hold pee for a long time. ? Empty your bladder after sex. ? Wipe from front to back after pooping if you are a female. Use each tissue one time when you wipe.  Drink enough fluid to keep your pee pale yellow.  Keep all follow-up visits as told by your doctor. This is important. Contact a doctor if:  You do not get better after 1-2 days.  Your symptoms go away and then come back. Get help right away if:  You have very bad back pain.  You have very bad pain in your lower belly.  You have a fever.  You are sick to your stomach (nauseous).  You are throwing up. Summary  A urinary tract infection (UTI) is an infection of any part of the urinary tract.  This condition is caused by germs in your genital area.  There are many risk factors for a UTI. These include having a small, thin   tube to drain pee and not being able to control when you pee or poop.  Treatment includes antibiotic medicines for germs.  Drink enough fluid to keep your pee pale yellow. This information is not intended to replace advice given to you by your health care provider. Make sure you discuss any questions you have with your health care provider. Document Released: 03/01/2008 Document Revised: 08/31/2018 Document Reviewed: 03/23/2018 Elsevier Patient Education  2020 Elsevier Inc.  

## 2019-06-23 LAB — URINE CULTURE

## 2019-06-26 LAB — CYTOLOGY - PAP
Diagnosis: NEGATIVE
Diagnosis: REACTIVE

## 2019-09-27 ENCOUNTER — Ambulatory Visit: Payer: Medicaid Other | Admitting: Obstetrics and Gynecology

## 2019-12-19 ENCOUNTER — Ambulatory Visit: Payer: Medicaid Other | Admitting: Obstetrics & Gynecology

## 2019-12-20 ENCOUNTER — Ambulatory Visit: Payer: Medicaid Other | Admitting: Obstetrics & Gynecology

## 2021-03-17 ENCOUNTER — Other Ambulatory Visit: Payer: Self-pay

## 2021-03-17 ENCOUNTER — Emergency Department: Payer: Medicaid Other

## 2021-03-17 ENCOUNTER — Emergency Department
Admission: EM | Admit: 2021-03-17 | Discharge: 2021-03-17 | Disposition: A | Discharge status: Home / Self Care | Payer: Medicaid Other | Attending: Emergency Medicine | Admitting: Emergency Medicine

## 2021-03-17 DIAGNOSIS — M545 Low back pain, unspecified: Secondary | ICD-10-CM | POA: Diagnosis present

## 2021-03-17 DIAGNOSIS — J45901 Unspecified asthma with (acute) exacerbation: Secondary | ICD-10-CM | POA: Diagnosis not present

## 2021-03-17 DIAGNOSIS — F17210 Nicotine dependence, cigarettes, uncomplicated: Secondary | ICD-10-CM | POA: Diagnosis not present

## 2021-03-17 MED ORDER — DIAZEPAM 2 MG PO TABS: 2.0000 mg | ORAL_TABLET | Freq: Three times a day (TID) | ORAL | 0 refills | Status: DC | PRN

## 2021-03-17 MED ORDER — KETOROLAC TROMETHAMINE 60 MG/2ML IM SOLN
30.0000 mg | Freq: Once | INTRAMUSCULAR | Status: AC
  Administered 2021-03-17: 30 mg via INTRAMUSCULAR
  Filled 2021-03-17: qty 2

## 2021-03-17 MED ORDER — OXYCODONE-ACETAMINOPHEN 5-325 MG PO TABS: 1.0000 | ORAL_TABLET | ORAL | 0 refills | Status: DC | PRN

## 2021-03-17 MED ORDER — OXYCODONE-ACETAMINOPHEN 5-325 MG PO TABS
1.0000 | ORAL_TABLET | Freq: Once | ORAL | Status: AC
  Administered 2021-03-17: 1 via ORAL
  Filled 2021-03-17: qty 1

## 2021-03-17 MED ORDER — DIAZEPAM 2 MG PO TABS
2.0000 mg | ORAL_TABLET | Freq: Once | ORAL | Status: AC
  Administered 2021-03-17: 2 mg via ORAL
  Filled 2021-03-17: qty 1

## 2021-03-17 MED ORDER — IBUPROFEN 800 MG PO TABS: 800.0000 mg | ORAL_TABLET | Freq: Three times a day (TID) | ORAL | 0 refills | Status: DC | PRN

## 2021-03-17 NOTE — ED Triage Notes (Signed)
Pt in with co lower back pain for over a week that radiates down both legs. Pt unsure of injury, pain worse when she walks or moves.
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
## 2021-03-17 NOTE — Discharge Instructions (Signed)
1. You may take medicines as needed for pain and muscle spasms (Motrin/Percocet/Valium #15). °2. Apply moist heat to affected area several times daily. °3. Return to the ER for worsening symptoms, persistent vomiting, difficulty breathing or other concerns. °

## 2021-03-25 ENCOUNTER — Emergency Department
Admission: EM | Admit: 2021-03-25 | Discharge: 2021-03-26 | Disposition: A | Discharge status: Home / Self Care | Payer: Medicaid Other | Attending: Emergency Medicine | Admitting: Emergency Medicine

## 2021-03-25 ENCOUNTER — Other Ambulatory Visit: Payer: Self-pay

## 2021-03-25 ENCOUNTER — Encounter: Payer: Self-pay | Admitting: *Deleted

## 2021-03-25 DIAGNOSIS — N3000 Acute cystitis without hematuria: Secondary | ICD-10-CM | POA: Insufficient documentation

## 2021-03-25 DIAGNOSIS — J45901 Unspecified asthma with (acute) exacerbation: Secondary | ICD-10-CM | POA: Insufficient documentation

## 2021-03-25 DIAGNOSIS — F17210 Nicotine dependence, cigarettes, uncomplicated: Secondary | ICD-10-CM | POA: Diagnosis not present

## 2021-03-25 DIAGNOSIS — M545 Low back pain, unspecified: Secondary | ICD-10-CM

## 2021-03-25 MED ORDER — KETOROLAC TROMETHAMINE 60 MG/2ML IM SOLN
30.0000 mg | Freq: Once | INTRAMUSCULAR | Status: AC
  Administered 2021-03-26: 30 mg via INTRAMUSCULAR
  Filled 2021-03-25: qty 2

## 2021-03-25 NOTE — ED Provider Notes (Signed)
[facility] Emergency Department Provider Note ____________________________________________  Time seen: Approximately 11:15 PM  I have reviewed the triage vital signs and the nursing notes.  HISTORY  Chief Complaint Back Pain   HPI Sandee DEVERA ENGLANDER is a 40 y.o. female who presents to the emergency department for treatment and evaluation of nontraumatic low back pain. She was evaluated here on the 21st and given pain medication. She took the last 2 today and needs more. No new injury or symptoms.    Past Medical History:  Diagnosis Date   Asthma    Bipolar 1 disorder (HCC)    Depression    GERD (gastroesophageal reflux disease)    History of kidney stones    Migraines    Schizo affective schizophrenia [facility])     Patient Active Problem List   Diagnosis Date Noted   CIN I (cervical intraepithelial neoplasia I) 06/21/2019   Atypical squamous cells cannot exclude high grade squamous intraepithelial lesion on cytologic smear of cervix (ASC-H) 11/13/2018   Sepsis (HCC) 10/28/2017   Septic shock (HCC)    Asthma exacerbation 09/11/2016   Breast abscess 03/19/2016   Asthma 09/08/2013    Past Surgical History:  Procedure Laterality Date   CYSTOSCOPY W/ RETROGRADES  08/22/2018   Procedure: CYSTOSCOPY WITH RETROGRADE PYELOGRAM;  Surgeon: Riki Altes, MD;  Location: [facility] ORS;  Service: Urology;;   CYSTOSCOPY W/ URETERAL STENT REMOVAL Right 08/22/2018   Procedure: CYSTOSCOPY WITH STENT REMOVAL;  Surgeon: Riki Altes, MD;  Location: [facility] OR[facility];  Service: Urology;  Laterality: Right;   CYSTOSCOPY WITH HOLMIUM LASER LITHOTRIPSY Right 08/22/2018   Procedure: CYSTOSCOPY WITH HOLMIUM LASER LITHOTRIPSY;  Surgeon: Riki Altes, MD;  Location: [facility];  Service: Urology;  Laterality: Right;   CYSTOSCOPY WITH STENT PLACEMENT Right 10/28/2017   Procedure: CYSTOSCOPY WITH right ureteral STENT PLACEMENT;  Surgeon: Riki Altes, MD;  Location: [facility] OR[facility];  Service:  Urology;  Laterality: Right;   CYSTOSCOPY WITH URETEROSCOPY Right 08/22/2018   Procedure: CYSTOSCOPY WITH URETEROSCOPY;  Surgeon: Riki Altes, MD;  Location: [facility] OR[facility];  Service: Urology;  Laterality: Right;   none      Prior to Admission medications   Medication Sig Start Date End Date Taking? Authorizing Provider  cephALEXin (KEFLEX) 500 MG capsule Take 1 capsule (500 mg total) by mouth 3 (three) times daily for 7 days. 03/26/21 04/02/21 Yes Marc Leichter B, FNP  ketorolac (TORADOL) 10 MG tablet Take 1 tablet (10 mg total) by mouth every 6 (six) hours as needed. 03/26/21  Yes Sudais Banghart B, FNP  tiZANidine (ZANAFLEX) 4 MG tablet Take 1 tablet (4 mg total) by mouth 3 (three) times daily. 03/26/21  Yes Jullie Arps B, FNP  cyclobenzaprine (FLEXERIL) 10 MG tablet Take 1 tablet (10 mg total) by mouth 3 (three) times daily as needed for muscle spasms. 12/03/18   Dionne Bucy, MD  diazepam (VALIUM) 2 MG tablet Take 1 tablet (2 mg total) by mouth every 8 (eight) hours as needed for muscle spasms. 03/17/21   Irean Hong, MD  ibuprofen (ADVIL) 800 MG tablet Take 1 tablet (800 mg total) by mouth every 8 (eight) hours as needed for moderate pain. 03/17/21   Irean Hong, MD  Multiple Vitamins-Iron (MULTIVITAMIN/IRON PO) Take 1 tablet by mouth daily.    [provider]  Multiple Vitamins-Minerals (HAIR SKIN NAILS PO) Take 2 tablets by mouth daily.    [provider]  oxybutynin (DITROPAN) 5 MG tablet 1 tab  tid prn frequency,urgency, bladder spasm 08/23/18   Stoioff, Verna Czech, MD  oxyCODONE-acetaminophen (PERCOCET/ROXICET) 5-325 MG tablet Take 1 tablet by mouth every 4 (four) hours as needed for severe pain. 03/17/21   Irean Hong, MD  Prenatal Vit-Fe Fumarate-FA (PRENATAL MULTIVITAMIN) TABS tablet Take 1 tablet by mouth daily at 12 noon.    [provider]    Allergies Patient has no known allergies.  Family History  Adopted: Yes  Problem Relation Age of Onset    Asthma Mother    Depression Mother    Diabetes Brother     Social History Social History   Tobacco Use   Smoking status: Every Day    Packs/day: 0.25    Years: 15.00    Pack years: 3.75    Types: Cigarettes   Smokeless tobacco: Former    Types: Snuff    Quit date: 2005   Tobacco comments:    pt states that she is thinking about alternatives  Vaping Use   Vaping Use: Former  Substance Use Topics   Alcohol use: Yes    Comment: occas   Drug use: Yes    Frequency: 2.0 times per week    Types: Cocaine, Marijuana    Comment: Cocaine and Marijuana once or twice a week    Review of Systems Constitutional: Well appearing. Respiratory: Negative for dyspnea. Cardiovascular: Negative for change in skin temperature or color. Musculoskeletal:   Negative for chronic steroid use   Negative for trauma in the presence of osteoporosis  Negative for age over 59 and trauma.  Negative for constitutional symptoms, or history of cancer   Negative for pain worse at night. Skin: Negative for rash, lesion, or wound.  Genitourinary: Negative for urinary retention. Rectal: Negative for fecal incontinence or new onset constipation/bowel habit changes. Hematological/Immunilogical: Negative for immunosuppression, IV drug use, or fever Neurological: Negative for burning, tingling, numb, electric, radiating pain in the legs.                        Negative for saddle anesthesia.                        Negative for focal neurologic deficit, progressive or disabling symptoms             Negative for saddle anesthesia. ____________________________________________   PHYSICAL EXAM:  VITAL SIGNS: ED Triage Vitals  Enc Vitals Group     BP 03/25/21 2117 (!) 149/97     Pulse Rate 03/25/21 2117 100     Resp 03/25/21 2117 20     Temp 03/25/21 2117 98.4 F (36.9 C)     Temp src --      SpO2 03/25/21 2117 98 %     Weight 03/25/21 2115 271 lb (122.9 kg)     Height 03/25/21 2115 5\' 3"  (1.6 m)      Head Circumference --      Peak Flow --      Pain Score 03/25/21 2115 10     Pain Loc --      Pain Edu? --      Excl. in GC? --     Constitutional: Alert and oriented. Well appearing and in no acute distress. Eyes: Conjunctivae are clear without discharge or drainage.  Head: Atraumatic. Neck: Full, active range of motion. Respiratory: Respirations even and unlabored. Musculoskeletal: Active ROM of the back and extremities, Strength 5/5 of the lower extremities as tested.  Neurologic: Reflexes of the lower extremities are 2+. Negative straight leg raise on the right and left side. Skin: Atraumatic.  Psychiatric: Behavior and affect are normal.  ____________________________________________   LABS (all labs ordered are listed, but only abnormal results are displayed)  Labs Reviewed  URINALYSIS, COMPLETE (UACMP) WITH MICROSCOPIC - Abnormal; Notable for the following components:      Result Value   Color, Urine YELLOW (*)    APPearance CLOUDY (*)    Hgb urine dipstick LARGE (*)    Nitrite POSITIVE (*)    Leukocytes,Ua SMALL (*)    Bacteria, UA RARE (*)    All other components within normal limits   ____________________________________________  RADIOLOGY  Not indicated. ____________________________________________   PROCEDURES  Procedure(s) performed:  Procedures ____________________________________________   INITIAL IMPRESSION / ASSESSMENT AND PLAN / ED COURSE  Jaquasha T Staiger is a 40 y.o. female who presents to the emergency department for treatment and evaluation of back pain.  See HPI for further details. She appears to be asleep each time someone enters the room, but awakens easily and responds appropriately.   Patient observed ambulating in the emergency department without assistance and with a coordinated steady gait.  Patient has a history of cocaine and marijuana use therefore additional prescriptions of benzodiazepines or narcotic medications will not be  prescribed tonight.  Plan will be to treat her with Keflex, Toradol and Zanaflex and have her follow-up with primary care for further evaluation and treatment.  Medications  cephALEXin (KEFLEX) capsule 500 mg (has no administration in time range)  ketorolac (TORADOL) injection 30 mg (30 mg Intramuscular Given 03/26/21 0025)    ED Discharge Orders          Ordered    ketorolac (TORADOL) 10 MG tablet  Every 6 hours PRN       Note to Pharmacy: Injection given in ER   03/26/21 0003    tiZANidine (ZANAFLEX) 4 MG tablet  3 times daily        03/26/21 0003    cephALEXin (KEFLEX) 500 MG capsule  3 times daily        03/26/21 0053             Pertinent labs & imaging results that were available during my care of the patient were reviewed by me and considered in my medical decision making (see chart for details).   _________________________________________   FINAL CLINICAL IMPRESSION(S) / ED DIAGNOSES  Final diagnoses:  Acute bilateral low back pain without sciatica  Acute cystitis without hematuria     If controlled substance prescribed during this visit, 12 month history viewed on the [facility] prior to issuing an initial prescription for Schedule II or III opiod.    Chinita Pester, FNP 03/26/21 5027    Sharman Cheek, MD 03/26/21 [phone]

## 2021-03-25 NOTE — ED Triage Notes (Signed)
Pt states she has lower back pain that is radiating down her right leg, pt states it hurts more when she goes from sitting to standing, pain has been there for 2 weeks.

## 2021-03-26 LAB — URINALYSIS, COMPLETE (UACMP) WITH MICROSCOPIC
Bilirubin Urine: NEGATIVE
Glucose, UA: NEGATIVE mg/dL
Ketones, ur: NEGATIVE mg/dL
Nitrite: POSITIVE — AB
Protein, ur: NEGATIVE mg/dL
Specific Gravity, Urine: 1.019 (ref 1.005–1.030)
pH: 5 (ref 5.0–8.0)

## 2021-03-26 MED ORDER — TIZANIDINE HCL 4 MG PO TABS: 4.0000 mg | ORAL_TABLET | Freq: Three times a day (TID) | ORAL | 0 refills | Status: DC

## 2021-03-26 MED ORDER — CEPHALEXIN 500 MG PO CAPS
500.0000 mg | ORAL_CAPSULE | Freq: Once | ORAL | Status: AC
  Administered 2021-03-26: 500 mg via ORAL
  Filled 2021-03-26: qty 1

## 2021-03-26 MED ORDER — KETOROLAC TROMETHAMINE 10 MG PO TABS: 10.0000 mg | ORAL_TABLET | Freq: Four times a day (QID) | ORAL | 0 refills | Status: DC | PRN

## 2021-03-26 MED ORDER — CEPHALEXIN 500 MG PO CAPS: 500.0000 mg | ORAL_CAPSULE | Freq: Three times a day (TID) | ORAL | 0 refills | Status: AC

## 2021-03-26 NOTE — Discharge Instructions (Addendum)
Take the toradol and zanaflex as prescribed. Take the antibiotic as prescribed and until finished.  Follow up with primary care if not improving over the next several days.  Use ice or heat on your lower back, whichever feels better.

## 2021-08-31 ENCOUNTER — Emergency Department: Payer: Medicaid Other

## 2021-08-31 ENCOUNTER — Emergency Department
Admission: EM | Admit: 2021-08-31 | Discharge: 2021-08-31 | Disposition: A | Discharge status: Home / Self Care | Payer: Medicaid Other | Attending: Emergency Medicine | Admitting: Emergency Medicine

## 2021-08-31 ENCOUNTER — Encounter: Payer: Self-pay | Admitting: Emergency Medicine

## 2021-08-31 ENCOUNTER — Other Ambulatory Visit: Payer: Self-pay

## 2021-08-31 DIAGNOSIS — F17210 Nicotine dependence, cigarettes, uncomplicated: Secondary | ICD-10-CM | POA: Diagnosis not present

## 2021-08-31 DIAGNOSIS — J4521 Mild intermittent asthma with (acute) exacerbation: Secondary | ICD-10-CM | POA: Insufficient documentation

## 2021-08-31 DIAGNOSIS — Z85828 Personal history of other malignant neoplasm of skin: Secondary | ICD-10-CM | POA: Insufficient documentation

## 2021-08-31 DIAGNOSIS — Z20822 Contact with and (suspected) exposure to covid-19: Secondary | ICD-10-CM | POA: Insufficient documentation

## 2021-08-31 DIAGNOSIS — R0602 Shortness of breath: Secondary | ICD-10-CM | POA: Diagnosis present

## 2021-08-31 DIAGNOSIS — N3000 Acute cystitis without hematuria: Secondary | ICD-10-CM | POA: Diagnosis not present

## 2021-08-31 LAB — URINALYSIS, ROUTINE W REFLEX MICROSCOPIC
Bilirubin Urine: NEGATIVE
Glucose, UA: NEGATIVE mg/dL
Hgb urine dipstick: NEGATIVE
Ketones, ur: NEGATIVE mg/dL
Nitrite: POSITIVE — AB
Protein, ur: NEGATIVE mg/dL
Specific Gravity, Urine: 1.015 (ref 1.005–1.030)
pH: 7 (ref 5.0–8.0)

## 2021-08-31 LAB — CBC WITH DIFFERENTIAL/PLATELET
Abs Immature Granulocytes: 0.02 10*3/uL (ref 0.00–0.07)
Basophils Absolute: 0.1 10*3/uL (ref 0.0–0.1)
Basophils Relative: 1 %
Eosinophils Absolute: 0.4 10*3/uL (ref 0.0–0.5)
Eosinophils Relative: 7 %
HCT: 42 % (ref 36.0–46.0)
Hemoglobin: 13.2 g/dL (ref 12.0–15.0)
Immature Granulocytes: 0 %
Lymphocytes Relative: 25 %
Lymphs Abs: 1.5 10*3/uL (ref 0.7–4.0)
MCH: 26.5 pg (ref 26.0–34.0)
MCHC: 31.4 g/dL (ref 30.0–36.0)
MCV: 84.3 fL (ref 80.0–100.0)
Monocytes Absolute: 0.5 10*3/uL (ref 0.1–1.0)
Monocytes Relative: 8 %
Neutro Abs: 3.5 10*3/uL (ref 1.7–7.7)
Neutrophils Relative %: 59 %
Platelets: 279 10*3/uL (ref 150–400)
RBC: 4.98 MIL/uL (ref 3.87–5.11)
RDW: 16 % — ABNORMAL HIGH (ref 11.5–15.5)
WBC: 6 10*3/uL (ref 4.0–10.5)
nRBC: 0 % (ref 0.0–0.2)

## 2021-08-31 LAB — RESP PANEL BY RT-PCR (FLU A&B, COVID) ARPGX2
Influenza A by PCR: NEGATIVE
Influenza B by PCR: NEGATIVE
SARS Coronavirus 2 by RT PCR: NEGATIVE

## 2021-08-31 LAB — TROPONIN I (HIGH SENSITIVITY): Troponin I (High Sensitivity): 3 ng/L (ref <18)

## 2021-08-31 MED ORDER — IPRATROPIUM-ALBUTEROL 0.5-2.5 (3) MG/3ML IN SOLN
3.0000 mL | Freq: Once | RESPIRATORY_TRACT | Status: AC
  Administered 2021-08-31: 3 mL via RESPIRATORY_TRACT
  Filled 2021-08-31: qty 3

## 2021-08-31 MED ORDER — METHYLPREDNISOLONE SODIUM SUCC 125 MG IJ SOLR
INTRAMUSCULAR | Status: AC
  Filled 2021-08-31: qty 2

## 2021-08-31 MED ORDER — PREDNISONE 50 MG PO TABS: 50.0000 mg | ORAL_TABLET | Freq: Every day | ORAL | 0 refills | Status: AC

## 2021-08-31 MED ORDER — ALBUTEROL SULFATE HFA 108 (90 BASE) MCG/ACT IN AERS: 2.0000 | INHALATION_SPRAY | Freq: Four times a day (QID) | RESPIRATORY_TRACT | 2 refills | Status: DC | PRN

## 2021-08-31 MED ORDER — CEPHALEXIN 500 MG PO CAPS: 500.0000 mg | ORAL_CAPSULE | Freq: Four times a day (QID) | ORAL | 0 refills | Status: AC

## 2021-08-31 MED ORDER — METHYLPREDNISOLONE SODIUM SUCC 125 MG IJ SOLR
125.0000 mg | Freq: Once | INTRAMUSCULAR | Status: AC
  Administered 2021-08-31: 125 mg via INTRAMUSCULAR
  Filled 2021-08-31: qty 2

## 2021-08-31 NOTE — ED Triage Notes (Signed)
Pt via POV from home. Pt having asthma exab progressively gotten worse the past 3 days. Pt also c/o L sided CP. Pt does not have an inhaler or nebulizer treatment. During triage, pt is tachypneic and having trouble speaking in complete sentences.

## 2021-08-31 NOTE — Discharge Instructions (Signed)
Take the Keflex antibiotics 4 times daily for the next 5 days to treat a UTI.  Pick up your prescription for the albuterol inhaler at the [facility] pharmacy.  Use as needed moving forward.  You are being discharged with a few days of steroids to help relax your lungs.  Take once daily for the next 4 days.

## 2021-08-31 NOTE — ED Provider Notes (Signed)
Emergency Medicine Provider Triage Evaluation Note  Amy Pennington , a 40 y.o. female  was evaluated in triage.  Pt complains of shortness breath, chest tightness, possible UTI.  Patient states that 2 weeks ago she had cold-like illness, has a history of asthma but does not have an inhaler currently.  She had ongoing cough which led to an exacerbation that has been worse for 3 days..  Patient is endorsing left-sided chest tightness and possible UTI as she is having some burning with urination.  No abdominal pain.  No flank pain.  No fevers or chills.  Review of Systems  Positive: Shortness of breath, chest tightness, dysuria Negative: Fevers, nasal congestion, sore throat, abdominal pain, flank pain  Physical Exam  Ht 5\' 3"  (1.6 m)   Wt 122.5 kg   BMI 47.83 kg/m  Gen:   Awake, moderate respiratory distress Resp:  Increased effort with inspiratory and expiratory wheezing, decreased air entry into the lower lung fields MSK:   Moves extremities without difficulty  Other:  No tenderness to abdomen on exam.  No CVA tenderness.  Medical Decision Making  Medically screening exam initiated at 4:38 PM.  Appropriate orders placed.  Amy Pennington was informed that the remainder of the evaluation will be completed by another provider, this initial triage assessment does not replace that evaluation, and the importance of remaining in the ED until their evaluation is complete.  Patient presents primarily for respiratory difficulties.  She has a history of asthma, had a cold-like illness 2 weeks ago and has been in exacerbation for 2 days.  Patient with no albuterol at home.  No fevers or chills, congestion, sore throat.  Ongoing cough and left-sided chest tightness.  Patient also is complaining of some dysuria.  At this time we will obtain breathing treatment, steroids, chest x-ray, COVID swab, labs EKG, urinalysis   , PA-C 08/31/21 1642    14/05/22, MD 08/31/21  [phone]

## 2021-08-31 NOTE — ED Provider Notes (Signed)
[facility] Emergency Department Provider Note ____________________________________________   Event Date/Time   First MD Initiated Contact with Patient 08/31/21 1709     (approximate)  I have reviewed the triage vital signs and the nursing notes.  HISTORY  Chief Complaint Asthma   HPI Amy Pennington is a 40 y.o. femalewho presents to the ED for evaluation of SOB and asthma.   Chart review indicates mild intermittent asthma, bipolar and schizoaffective disorder.  Patient presents to the ED, accompanied by her boyfriend, for evaluation of  shortness of breath, cough and wheezing superimposed on a couple weeks of upper respiratory congestion. She reports feeling upper respiratory congestion, postnasal drip and difficulty breathing through her nose for nearly 2 weeks now.  Reports developing wheezing, nonproductive cough and dyspnea on exertion over the past couple days.  Reports waking up in the middle the night with a congested nose and difficulty breathing, improved when sitting upright and breathing through her mouth.  Reports her albuterol inhaler has run out and no longer functional.  Denies recent antibiotics or steroids.  Denies fevers, chest pain, syncope, abdominal pain or emesis, but does report some urinary frequency without dysuria and questions a UTI.  Past Medical History:  Diagnosis Date   Asthma    Bipolar 1 disorder (HCC)    Depression    GERD (gastroesophageal reflux disease)    History of kidney stones    Migraines    Schizo affective schizophrenia [facility])     Patient Active Problem List   Diagnosis Date Noted   CIN I (cervical intraepithelial neoplasia I) 06/21/2019   Atypical squamous cells cannot exclude high grade squamous intraepithelial lesion on cytologic smear of cervix (ASC-H) 11/13/2018   Sepsis (HCC) 10/28/2017   Septic shock (HCC)    Asthma exacerbation 09/11/2016   Breast abscess 03/19/2016   Asthma 09/08/2013     Past Surgical History:  Procedure Laterality Date   CYSTOSCOPY W/ RETROGRADES  08/22/2018   Procedure: CYSTOSCOPY WITH RETROGRADE PYELOGRAM;  Surgeon: Riki Altes, MD;  Location: [facility] ORS;  Service: Urology;;   CYSTOSCOPY W/ URETERAL STENT REMOVAL Right 08/22/2018   Procedure: CYSTOSCOPY WITH STENT REMOVAL;  Surgeon: Riki Altes, MD;  Location: [facility] ORS;  Service: Urology;  Laterality: Right;   CYSTOSCOPY WITH HOLMIUM LASER LITHOTRIPSY Right 08/22/2018   Procedure: CYSTOSCOPY WITH HOLMIUM LASER LITHOTRIPSY;  Surgeon: Riki Altes, MD;  Location: [facility] ORS;  Service: Urology;  Laterality: Right;   CYSTOSCOPY WITH STENT PLACEMENT Right 10/28/2017   Procedure: CYSTOSCOPY WITH right ureteral STENT PLACEMENT;  Surgeon: Riki Altes, MD;  Location: [facility] ORS;  Service: Urology;  Laterality: Right;   CYSTOSCOPY WITH URETEROSCOPY Right 08/22/2018   Procedure: CYSTOSCOPY WITH URETEROSCOPY;  Surgeon: Riki Altes, MD;  Location: [facility] ORS;  Service: Urology;  Laterality: Right;   none      Prior to Admission medications   Medication Sig Start Date End Date Taking? Authorizing Provider  albuterol (VENTOLIN HFA) 108 (90 Base) MCG/ACT inhaler Inhale 2 puffs into the lungs every 6 (six) hours as needed for wheezing or shortness of breath. 08/31/21  Yes Delton Prairie, MD  cephALEXin (KEFLEX) 500 MG capsule Take 1 capsule (500 mg total) by mouth 4 (four) times daily for 5 days. 08/31/21 09/05/21 Yes Delton Prairie, MD  predniSONE (DELTASONE) 50 MG tablet Take 1 tablet (50 mg total) by mouth daily for 4 days. 08/31/21 09/04/21 Yes Delton Prairie, MD  cyclobenzaprine (FLEXERIL) 10 MG tablet Take 1  tablet (10 mg total) by mouth 3 (three) times daily as needed for muscle spasms. 12/03/18   Dionne Bucy, MD  diazepam (VALIUM) 2 MG tablet Take 1 tablet (2 mg total) by mouth every 8 (eight) hours as needed for muscle spasms. 03/17/21   Irean Hong, MD  ibuprofen (ADVIL) 800 MG tablet Take 1 tablet  (800 mg total) by mouth every 8 (eight) hours as needed for moderate pain. 03/17/21   Irean Hong, MD  ketorolac (TORADOL) 10 MG tablet Take 1 tablet (10 mg total) by mouth every 6 (six) hours as needed. 03/26/21   Triplett, Kasandra Knudsen, FNP  Multiple Vitamins-Iron (MULTIVITAMIN/IRON PO) Take 1 tablet by mouth daily.    [provider]  Multiple Vitamins-Minerals (HAIR SKIN NAILS PO) Take 2 tablets by mouth daily.    [provider]  oxybutynin (DITROPAN) 5 MG tablet 1 tab tid prn frequency,urgency, bladder spasm 08/23/18   Stoioff, Verna Czech, MD  oxyCODONE-acetaminophen (PERCOCET/ROXICET) 5-325 MG tablet Take 1 tablet by mouth every 4 (four) hours as needed for severe pain. 03/17/21   Irean Hong, MD  Prenatal Vit-Fe Fumarate-FA (PRENATAL MULTIVITAMIN) TABS tablet Take 1 tablet by mouth daily at 12 noon.    [provider]  tiZANidine (ZANAFLEX) 4 MG tablet Take 1 tablet (4 mg total) by mouth 3 (three) times daily. 03/26/21   Chinita Pester, FNP    Allergies Patient has no known allergies.  Family History  Adopted: Yes  Problem Relation Age of Onset   Asthma Mother    Depression Mother    Diabetes Brother     Social History Social History   Tobacco Use   Smoking status: Every Day    Packs/day: 0.25    Years: 15.00    Pack years: 3.75    Types: Cigarettes   Smokeless tobacco: Former    Types: Snuff    Quit date: 2005   Tobacco comments:    pt states that she is thinking about alternatives  Vaping Use   Vaping Use: Former  Substance Use Topics   Alcohol use: Yes    Comment: occas   Drug use: Yes    Frequency: 2.0 times per week    Types: Cocaine, Marijuana    Comment: Cocaine and Marijuana once or twice a week    Review of Systems  Constitutional: No fever/chills Eyes: No visual changes. ENT: No sore throat.  Positive for upper respiratory congestion Cardiovascular: Denies chest pain. Respiratory: Positive for cough and shortness of  breath. Gastrointestinal: No abdominal pain.  No nausea, no vomiting.  No diarrhea.  No constipation. Genitourinary: Negative for dysuria.  Positive for urinary frequency Musculoskeletal: Negative for back pain. Skin: Negative for rash. Neurological: Negative for headaches, focal weakness or numbness.  ____________________________________________   PHYSICAL EXAM:  VITAL SIGNS: Vitals:   08/31/21 1639 08/31/21 1824  BP: 125/71 128/75  Pulse: (!) 101 89  Resp: (!) 30 20  Temp: 98.4 F (36.9 C)   SpO2: 99% 97%    Constitutional: Alert and oriented. Well appearing and in no acute distress.  Morbidly obese, laying on her side and not tachypneic or dyspneic during my conversation.  Does seem to increase respiratory rate during my auscultation. Eyes: Conjunctivae are normal. PERRL. EOMI. Head: Atraumatic. Nose: No congestion/rhinnorhea. Mouth/Throat: Mucous membranes are moist.  Oropharynx non-erythematous. Neck: No stridor. No cervical spine tenderness to palpation. Cardiovascular: Normal rate, regular rhythm. Grossly normal heart sounds.  Good peripheral circulation. Respiratory:  No retractions.  Minimal and scattered expiratory wheezes.  Good air movement throughout. Gastrointestinal: Soft , nondistended, nontender to palpation. No CVA tenderness. Musculoskeletal: No lower extremity tenderness nor edema.  No joint effusions. No signs of acute trauma. Neurologic:  Normal speech and language. No gross focal neurologic deficits are appreciated. No gait instability noted. Skin:  Skin is warm, dry and intact. No rash noted. Psychiatric: Mood and affect are normal. Speech and behavior are normal. ____________________________________________   LABS (all labs ordered are listed, but only abnormal results are displayed)  Labs Reviewed  CBC WITH DIFFERENTIAL/PLATELET - Abnormal; Notable for the following components:      Result Value   RDW 16.0 (*)    All other components within  normal limits  URINALYSIS, ROUTINE W REFLEX MICROSCOPIC - Abnormal; Notable for the following components:   Color, Urine YELLOW (*)    APPearance HAZY (*)    Nitrite POSITIVE (*)    Leukocytes,Ua TRACE (*)    Bacteria, UA RARE (*)    All other components within normal limits  RESP PANEL BY RT-PCR (FLU A&B, COVID) [id number]  BASIC METABOLIC PANEL  TROPONIN I (HIGH SENSITIVITY)  TROPONIN I (HIGH SENSITIVITY)   ____________________________________________  12 Lead EKG  Sinus rhythm, rate of 97 bpm.  Normal axis and intervals.  No evidence of acute ischemia. ____________________________________________  RADIOLOGY  ED MD interpretation:  CXR reviewed by me without evidence of acute cardiopulmonary pathology.  Official radiology report(s): DG Chest 2 View  Result Date: 08/31/2021 CLINICAL DATA:  Shortness of breath and chest tightness. EXAM: CHEST - 2 VIEW COMPARISON:  October 27, 2017 FINDINGS: The heart size and mediastinal contours are within normal limits. Both lungs are clear. The visualized skeletal structures are unremarkable. IMPRESSION: No active cardiopulmonary disease. Electronically Signed   By: Aram Candelahaddeus  Houston M.D.   On: 08/31/2021 17:44    ____________________________________________   PROCEDURES and INTERVENTIONS  Procedure(s) performed (including Critical Care):  Procedures  Medications  methylPREDNISolone sodium succinate (SOLU-MEDROL) 125 mg/2 mL injection (has no administration in time range)  ipratropium-albuterol (DUONEB) 0.5-2.5 (3) MG/3ML nebulizer solution 3 mL (3 mLs Nebulization Given 08/31/21 1651)  methylPREDNISolone sodium succinate (SOLU-MEDROL) 125 mg/2 mL injection 125 mg (125 mg Intramuscular Given 08/31/21 1659)  ipratropium-albuterol (DUONEB) 0.5-2.5 (3) MG/3ML nebulizer solution 3 mL (3 mLs Nebulization Given 08/31/21 1823)    ____________________________________________   MDM / ED COURSE   [age] year female presents to the ED with SOB, cough  and urinary frequency with evidence of mild asthma exacerbation and acute cystitis, amenable to outpatient management. No hypoxia or instability. Looks clinically well. No persistent wheezing. No signs of PTX, infiltrate on CXR. No evidence of ACS. Urine with infectious features.  No evidence of pyelonephritis or sepsis.  Will discharge with prednisone burst, Keflex for her UTI, and a refill for her albuterol.  Return precautions discussed.  Clinical Course as of 08/31/21 1901  Mon Aug 31, 2021  1718 Chatted with boyfriend at the bedside as patient is gone to x-ray. [DS]  1751 Provided some water for a urine specimen [DS]  1859 Reassessed.  Feeling a lot better.  No longer tachypneic.  We discussed acute cystitis and asthma exacerbation.  She is ready to go and does not want a wait for any more blood work.  We discussed management at home and return precautions for the ED.  She ambulates out of no acute distress with her boyfriend, requesting more graham crackers when she leaves [DS]  Clinical Course User Index [DS] Vladimir Crofts, MD    ____________________________________________   FINAL CLINICAL IMPRESSION(S) / ED DIAGNOSES  Final diagnoses:  Acute cystitis without hematuria  Mild intermittent asthma with exacerbation     ED Discharge Orders          Ordered    albuterol (VENTOLIN HFA) 108 (90 Base) MCG/ACT inhaler  Every 6 hours PRN        08/31/21 1852    cephALEXin (KEFLEX) 500 MG capsule  4 times daily        08/31/21 1852    predniSONE (DELTASONE) 50 MG tablet  Daily        08/31/21 1853             Fionnuala Hemmerich Tamala Julian   Note:  This document was prepared using Dragon voice recognition software and may include unintentional dictation errors.    Vladimir Crofts, MD 08/31/21 [phone]

## 2022-01-18 ENCOUNTER — Emergency Department
Admission: EM | Admit: 2022-01-18 | Discharge: 2022-01-18 | Disposition: A | Discharge status: Home / Self Care | Payer: Commercial Managed Care - HMO | Attending: Emergency Medicine | Admitting: Emergency Medicine

## 2022-01-18 ENCOUNTER — Other Ambulatory Visit: Payer: Self-pay

## 2022-01-18 DIAGNOSIS — R8289 Other abnormal findings on cytological and histological examination of urine: Secondary | ICD-10-CM | POA: Insufficient documentation

## 2022-01-18 DIAGNOSIS — R748 Abnormal levels of other serum enzymes: Secondary | ICD-10-CM | POA: Insufficient documentation

## 2022-01-18 DIAGNOSIS — K859 Acute pancreatitis without necrosis or infection, unspecified: Secondary | ICD-10-CM | POA: Diagnosis not present

## 2022-01-18 DIAGNOSIS — K644 Residual hemorrhoidal skin tags: Secondary | ICD-10-CM | POA: Diagnosis not present

## 2022-01-18 DIAGNOSIS — J45909 Unspecified asthma, uncomplicated: Secondary | ICD-10-CM | POA: Diagnosis not present

## 2022-01-18 DIAGNOSIS — R197 Diarrhea, unspecified: Secondary | ICD-10-CM | POA: Insufficient documentation

## 2022-01-18 DIAGNOSIS — R1031 Right lower quadrant pain: Secondary | ICD-10-CM | POA: Diagnosis present

## 2022-01-18 LAB — CBC
HCT: 44.9 % (ref 36.0–46.0)
Hemoglobin: 13.5 g/dL (ref 12.0–15.0)
MCH: 24.8 pg — ABNORMAL LOW (ref 26.0–34.0)
MCHC: 30.1 g/dL (ref 30.0–36.0)
MCV: 82.4 fL (ref 80.0–100.0)
Platelets: 412 10*3/uL — ABNORMAL HIGH (ref 150–400)
RBC: 5.45 MIL/uL — ABNORMAL HIGH (ref 3.87–5.11)
RDW: 15.4 % (ref 11.5–15.5)
WBC: 8.3 10*3/uL (ref 4.0–10.5)
nRBC: 0 % (ref 0.0–0.2)

## 2022-01-18 LAB — COMPREHENSIVE METABOLIC PANEL WITH GFR
ALT: 15 U/L (ref 0–44)
AST: 16 U/L (ref 15–41)
Albumin: 4 g/dL (ref 3.5–5.0)
Alkaline Phosphatase: 41 U/L (ref 38–126)
Anion gap: 7 (ref 5–15)
BUN: 13 mg/dL (ref 6–20)
CO2: 21 mmol/L — ABNORMAL LOW (ref 22–32)
Calcium: 8.8 mg/dL — ABNORMAL LOW (ref 8.9–10.3)
Chloride: 107 mmol/L (ref 98–111)
Creatinine, Ser: 0.68 mg/dL (ref 0.44–1.00)
GFR, Estimated: >60 mL/min (ref >60)
Glucose, Bld: 104 mg/dL — ABNORMAL HIGH (ref 70–99)
Potassium: 4.2 mmol/L (ref 3.5–5.1)
Sodium: 135 mmol/L (ref 135–145)
Total Bilirubin: 0.3 mg/dL (ref 0.3–1.2)
Total Protein: 7.3 g/dL (ref 6.5–8.1)

## 2022-01-18 LAB — URINALYSIS, ROUTINE W REFLEX MICROSCOPIC
Bilirubin Urine: NEGATIVE
Glucose, UA: NEGATIVE mg/dL
Hgb urine dipstick: NEGATIVE
Ketones, ur: NEGATIVE mg/dL
Nitrite: NEGATIVE
Protein, ur: NEGATIVE mg/dL
Specific Gravity, Urine: 1.026 (ref 1.005–1.030)
pH: 5 (ref 5.0–8.0)

## 2022-01-18 LAB — PREGNANCY, URINE: Preg Test, Ur: NEGATIVE

## 2022-01-18 LAB — LIPASE, BLOOD: Lipase: 55 U/L — ABNORMAL HIGH (ref 11–51)

## 2022-01-18 MED ORDER — ONDANSETRON 4 MG PO TBDP: 4.0000 mg | ORAL_TABLET | Freq: Three times a day (TID) | ORAL | 0 refills | Status: DC | PRN

## 2022-01-18 MED ORDER — DICYCLOMINE HCL 20 MG PO TABS
20.0000 mg | ORAL_TABLET | Freq: Three times a day (TID) | ORAL | 1 refills | Status: DC
Start: 2022-01-18 — End: 2023-09-28

## 2022-01-18 MED ORDER — LACTATED RINGERS IV BOLUS
1000.0000 mL | Freq: Once | INTRAVENOUS | Status: AC
  Administered 2022-01-18: 1000 mL via INTRAVENOUS

## 2022-01-18 MED ORDER — DICYCLOMINE HCL 10 MG PO CAPS
10.0000 mg | ORAL_CAPSULE | Freq: Once | ORAL | Status: AC
  Administered 2022-01-18: 10 mg via ORAL
  Filled 2022-01-18: qty 1

## 2022-01-18 NOTE — ED Notes (Addendum)
Pt sleeping soundly.

## 2022-01-18 NOTE — ED Notes (Signed)
Pt oob standing at bedside toilet, washing hands and walking without c/o dizziness. ?

## 2022-01-18 NOTE — ED Provider Notes (Signed)
? ?[facility] ?Provider Note ? ? ? Event Date/Time  ? First MD Initiated Contact with Patient 01/18/22 [id number]   ?  (approximate) ? ? ?History  ? ?Rectal Bleeding and Abdominal Pain ? ? ?HPI ? ?Amy Pennington is a 41 y.o. female who presents to the ED for evaluation of Rectal Bleeding and Abdominal Pain ?  ?Hx bipolar and schizoaffective disorder. Hx intermittent asthma. Morbidly obese.  ? ?Patient presents to the ED for evaluation of abdominal cramping and recurrent loose stools.  She reports dozens of loose stools in the past 12 hours, mostly with bright red blood.  Reports anorectal pain and history of external hemorrhoids.  ? ?Reports cramping pain throughout abdomen, right-sided upper and lower.  Denies fevers or dysuria. ? ?Physical Exam  ? ?Triage Vital Signs: ?ED Triage Vitals  ?Enc Vitals Group  ?   BP 01/18/22 0904 (!) 141/93  ?   Pulse Rate 01/18/22 0904 (!) 103  ?   Resp 01/18/22 0904 16  ?   Temp 01/18/22 0904 98.2 ?F (36.8 ?C)  ?   Temp Source 01/18/22 0904 Oral  ?   SpO2 01/18/22 0904 98 %  ?   Weight 01/18/22 0900 278 lb (126.1 kg)  ?   Height 01/18/22 0900 5\' 3"  (1.6 m)  ?   Head Circumference --   ?   Peak Flow --   ?   Pain Score 01/18/22 0900 8  ?   Pain Loc --   ?   Pain Edu? --   ?   Excl. in [place]? --   ? ? ?Most recent vital signs: ?Vitals:  ? 01/18/22 1030 01/18/22 1100  ?BP: 130/75 121/71  ?Pulse: 91 78  ?Resp:    ?Temp:    ?SpO2: 93% 95%  ? ? ?General: Awake, no distress.  Morbidly obese, ambulatory and conversational ?CV:  Good peripheral perfusion. RRR ?Resp:  Normal effort.  ?Abd:  No distention.  Soft and benign throughout.  Rectal exam with a small external hemorrhoid that is nonthrombosed.  Hemoccult positive brown stool. ?MSK:  No deformity noted.  ?Neuro:  No focal deficits appreciated. ?Other:   ? ? ?ED Results / Procedures / Treatments  ? ?Labs ?(all labs ordered are listed, but only abnormal results are displayed) ?Labs Reviewed  ?COMPREHENSIVE METABOLIC PANEL  - Abnormal; Notable for the following components:  ?    Result Value  ? CO2 21 (*)   ? Glucose, Bld 104 (*)   ? Calcium 8.8 (*)   ? All other components within normal limits  ?CBC - Abnormal; Notable for the following components:  ? RBC 5.45 (*)   ? MCH 24.8 (*)   ? Platelets 412 (*)   ? All other components within normal limits  ?LIPASE, BLOOD - Abnormal; Notable for the following components:  ? Lipase 55 (*)   ? All other components within normal limits  ?URINALYSIS, ROUTINE W REFLEX MICROSCOPIC - Abnormal; Notable for the following components:  ? Color, Urine YELLOW (*)   ? APPearance HAZY (*)   ? Leukocytes,Ua MODERATE (*)   ? Bacteria, UA RARE (*)   ? All other components within normal limits  ?URINE CULTURE  ?PREGNANCY, URINE  ?POC URINE PREG, ED  ?POC OCCULT BLOOD, ED  ? ? ?EKG ? ? ?RADIOLOGY ? ? ?Official radiology report(s): ?No results found. ? ?PROCEDURES and INTERVENTIONS: ? ?.1-3 Lead EKG Interpretation ?Performed by: Vladimir Crofts, MD ?Authorized by: Vladimir Crofts, MD  ? ?  Interpretation: normal   ?  ECG rate:  80 ?  ECG rate assessment: normal   ?  Rhythm: sinus rhythm   ?  Ectopy: none   ?  Conduction: normal   ? ?Medications  ?lactated ringers bolus 1,000 mL (0 mLs Intravenous Stopped 01/18/22 1130)  ?dicyclomine (BENTYL) capsule 10 mg (10 mg Oral Given 01/18/22 1056)  ? ? ? ?IMPRESSION / MDM / ASSESSMENT AND PLAN / ED COURSE  ?I reviewed the triage vital signs and the nursing notes. ? ?41year old patient presents to the ED with abdominal cramping and frequent loose stools, with evidence of mild idiopathic pancreatitis and suitable for outpatient management. She has a benign abdominal exam and looks clinically well.  Nonbleeding and nonthrombosed external hemorrhoid is noted.  Blood work is reassuring without evidence of leukocytosis, hemoglobin drop or significant metabolic derangements.  Renal function and electrolytes intact.  She is not pregnant.  Lipase is slightly elevated, possibly the source of  her abdominal cramping.  Urinalysis is clouded by moderate squamous cells.  She denies any urinary symptoms and does not clearly have acute cystitis.  We will send for a culture and abstain from antibiotics today considering her diarrhea.  We will provide her Bentyl and perform p.o. challenge with anticipation of outpatient management. ? ?Clinical Course as of 01/18/22 1131  ?Mon Jan 18, 2022  ?[id number] Reassessed and clarified dysuria and urinary symptoms.  She reports she has no symptoms and has been urinating fine.  She is resting comfortably in a dark room and is thankful as this has been the first time that she has slept in a while. [DS]  ?[place].  Patient fast asleep with normal vital signs. [DS]  ?1110 Reassessed and reexamined.  Reports feeling better.  Maintains normal vital signs. [DS]  ?  ?Clinical Course User Index ?[DS] Vladimir Crofts, MD  ? ? ? ?FINAL CLINICAL IMPRESSION(S) / ED DIAGNOSES  ? ?Final diagnoses:  ?Diarrhea, unspecified type  ?Acute pancreatitis, unspecified complication status, unspecified pancreatitis type  ?External hemorrhoid  ? ? ? ?Rx / DC Orders  ? ?ED Discharge Orders   ? ?      Ordered  ?  ondansetron (ZOFRAN-ODT) 4 MG disintegrating tablet  Every 8 hours PRN       ? 01/18/22 1033  ?  dicyclomine (BENTYL) 20 MG tablet  3 times daily before meals & bedtime       ? 01/18/22 1033  ? ?  ?  ? ?  ? ? ? ?Note:  This document was prepared using Dragon voice recognition software and may include unintentional dictation errors. ?  ?Vladimir Crofts, MD ?01/18/22 1131 ? ?

## 2022-01-18 NOTE — Discharge Instructions (Signed)
Please use the Bentyl for abdominal cramping and Zofran as needed for nausea and vomiting. ?

## 2022-01-18 NOTE — ED Triage Notes (Signed)
Pt to ED complains of bloody diarrhea since yesterday, states stool is bright red with clots. States had to get up every 30 minutes last night and hardly slept. No PO intake since 11pm last night. Denies vomiting but has "sour burps" that smell like sulfur. ? ?Pt also has RLQ abdominal pain since last night which moved to mid upper abdomen. Pain is cramping, stabbing and "just there". Denies urinary symptoms. States is urinating less because has not been drinking. ? ?Pt walked to triage room with steady gait. Endorses slight dizziness with walking but thinks may be vertigo because also has L earache since 4 days ago. ?

## 2022-01-20 LAB — URINE CULTURE: Culture: >=100000 — AB

## 2022-03-12 IMAGING — CR DG CHEST 2V
1 series · 2 of 2 positions shown · non-contrast
Comparison: October 27, 2017

CLINICAL DATA: Shortness of breath and chest tightness.

EXAM:
CHEST - 2 VIEW

[Series 1: dg chest 2 view · 0.14mm/px · 2 of 2 slices shown]
[im 1/2]
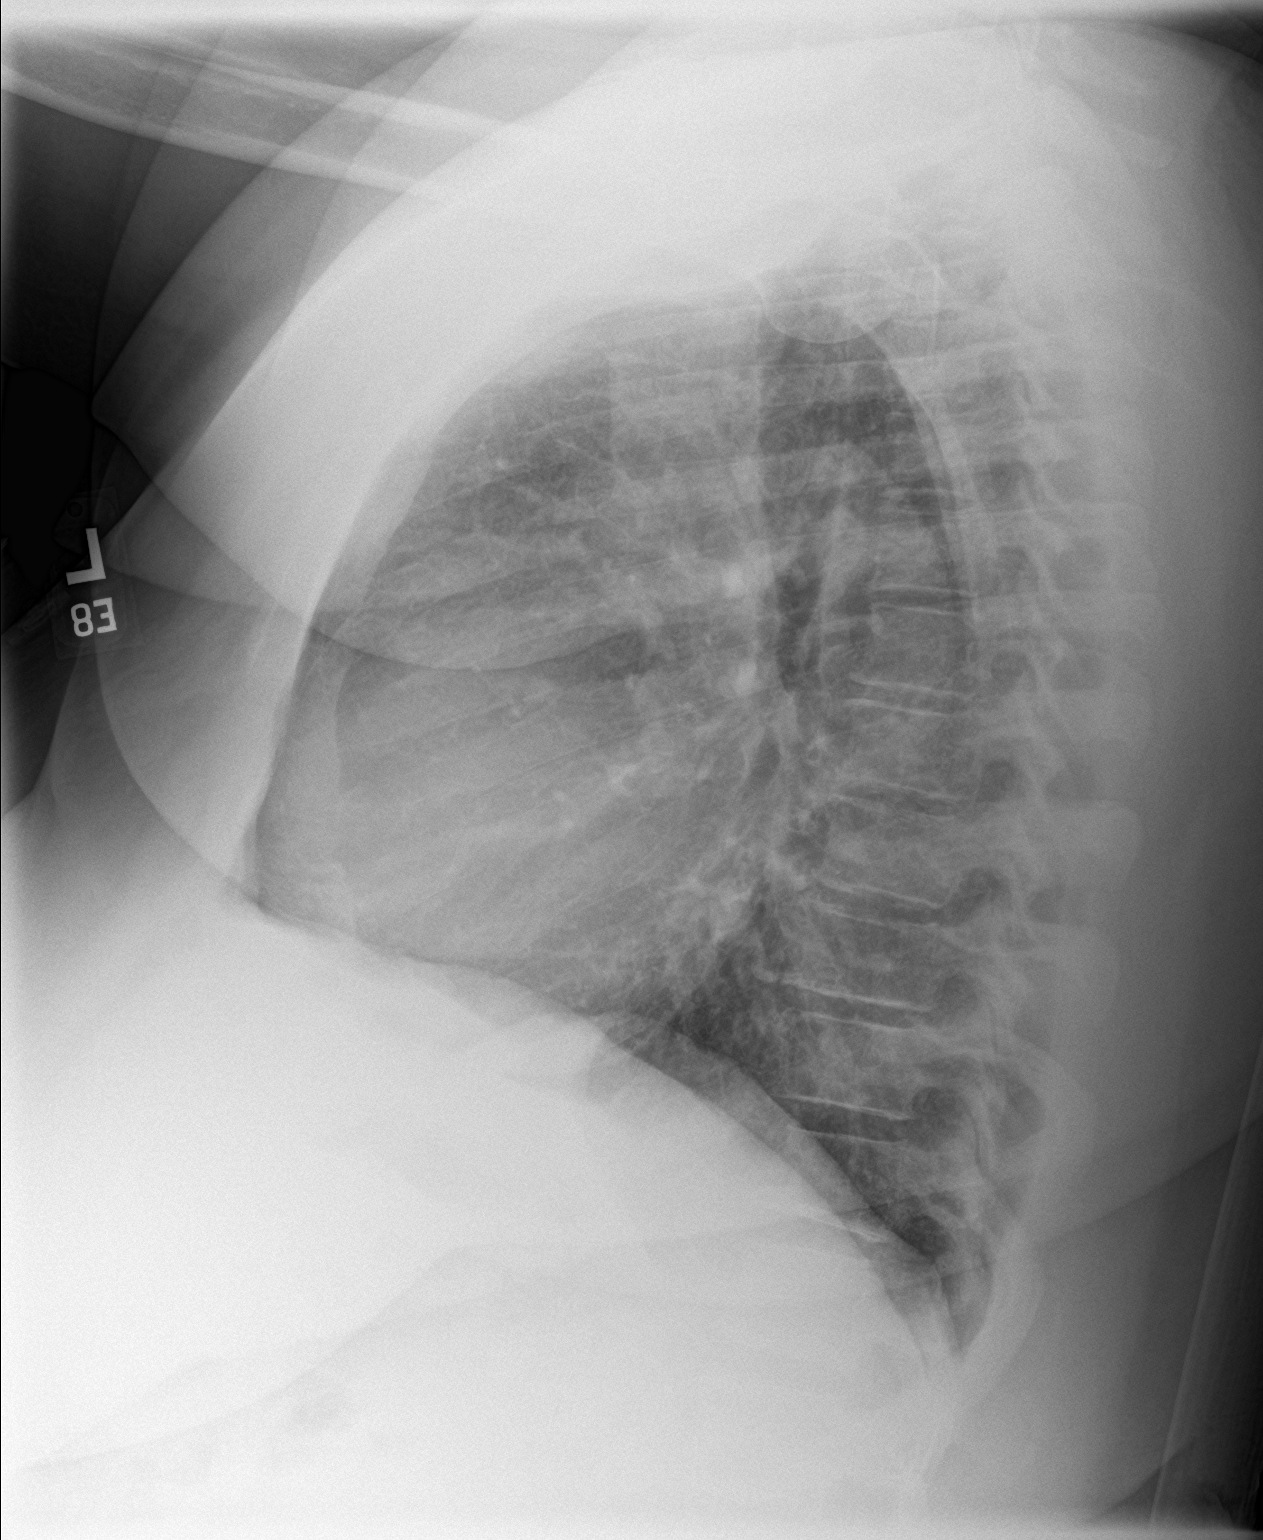
[im 2/2]
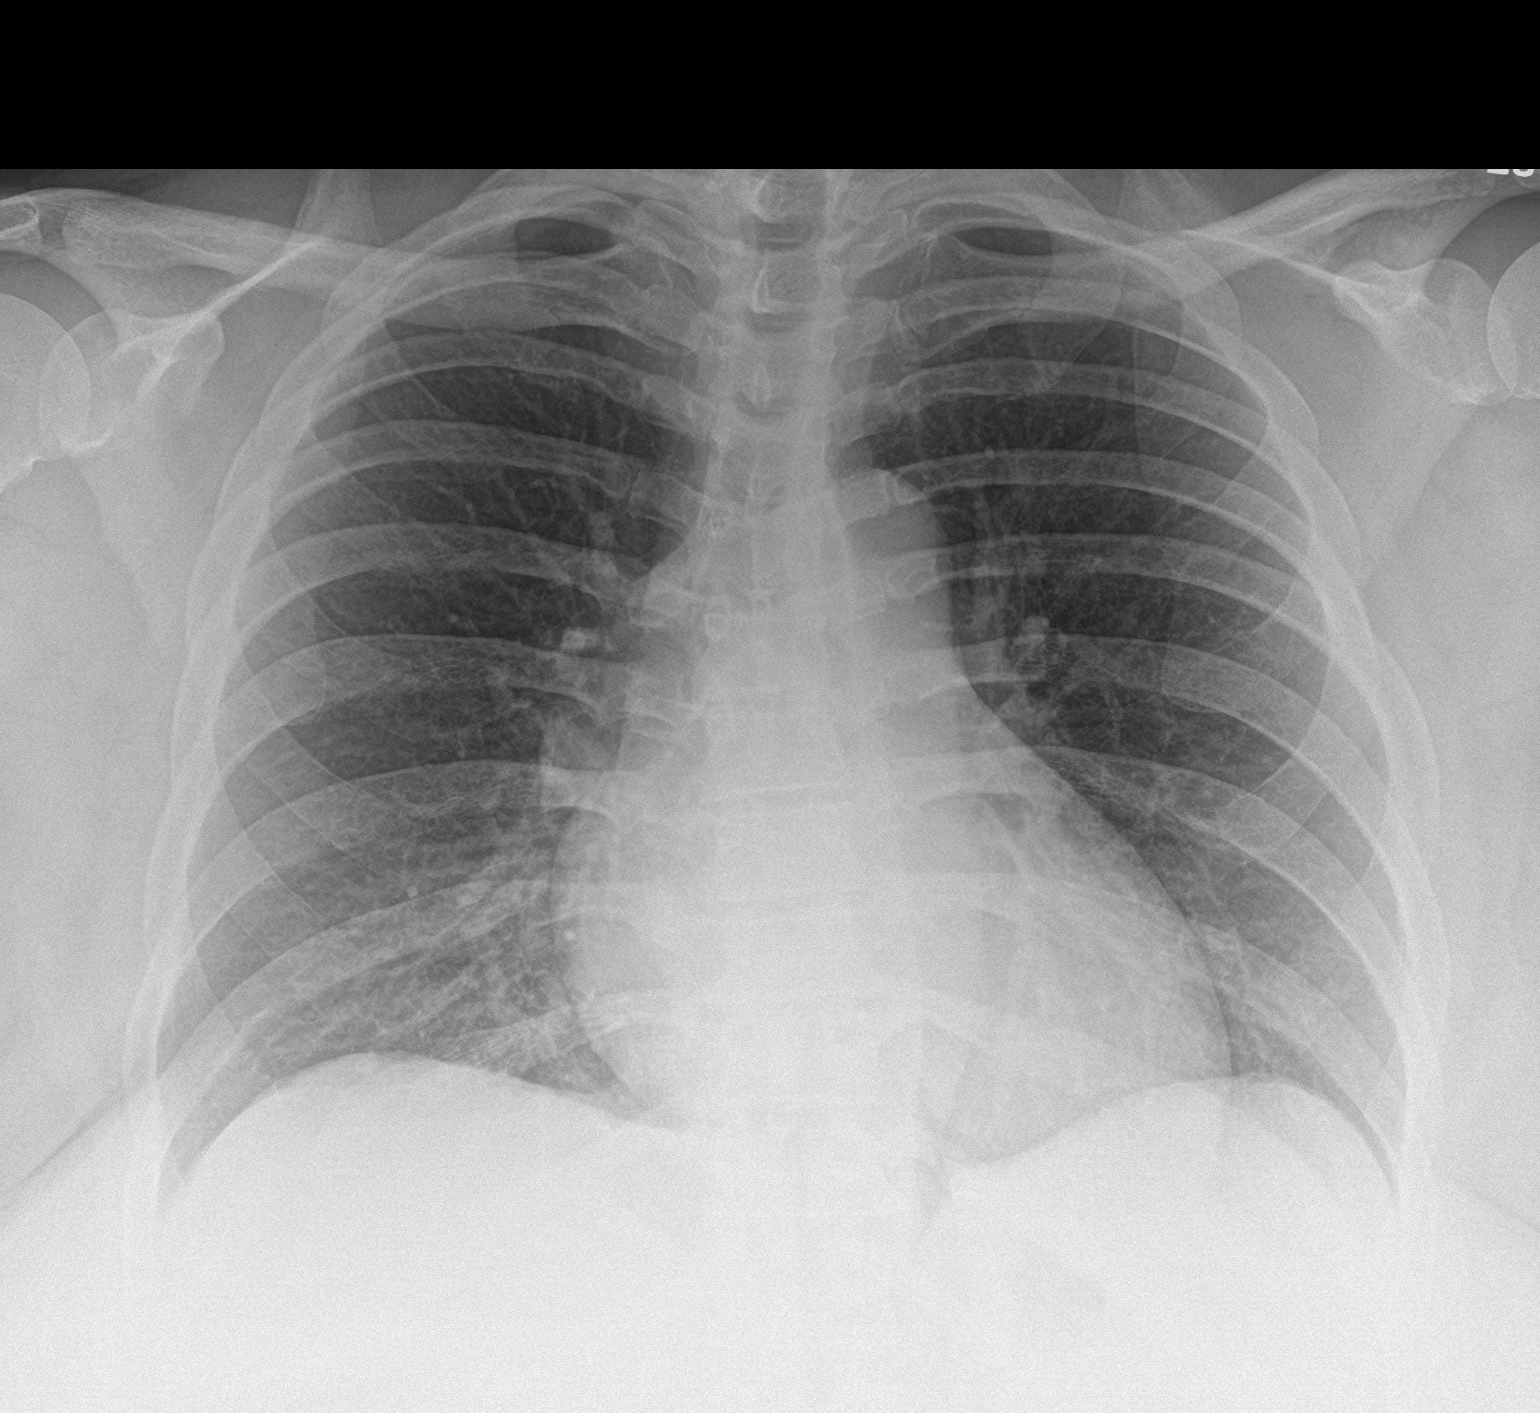

[2 of 2 positions shown; findings below may reference images not displayed]

FINDINGS: The heart size and mediastinal contours are within normal limits.
Both lungs are clear. The visualized skeletal structures are
unremarkable.
IMPRESSION: No active cardiopulmonary disease.

## 2022-09-30 ENCOUNTER — Other Ambulatory Visit: Payer: Self-pay

## 2022-09-30 ENCOUNTER — Encounter: Payer: Self-pay | Admitting: *Deleted

## 2022-09-30 ENCOUNTER — Emergency Department
Admission: EM | Admit: 2022-09-30 | Discharge: 2022-09-30 | Disposition: A | Discharge status: Home / Self Care | Payer: Medicaid Other | Attending: Emergency Medicine | Admitting: Emergency Medicine

## 2022-09-30 DIAGNOSIS — J101 Influenza due to other identified influenza virus with other respiratory manifestations: Secondary | ICD-10-CM | POA: Insufficient documentation

## 2022-09-30 DIAGNOSIS — Z20822 Contact with and (suspected) exposure to covid-19: Secondary | ICD-10-CM | POA: Insufficient documentation

## 2022-09-30 DIAGNOSIS — R059 Cough, unspecified: Secondary | ICD-10-CM | POA: Diagnosis present

## 2022-09-30 LAB — RESP PANEL BY RT-PCR (RSV, FLU A&B, COVID)  RVPGX2
Influenza A by PCR: POSITIVE — AB
Influenza B by PCR: NEGATIVE
Resp Syncytial Virus by PCR: NEGATIVE
SARS Coronavirus 2 by RT PCR: NEGATIVE

## 2022-09-30 MED ORDER — OSELTAMIVIR PHOSPHATE 75 MG PO CAPS: 75.0000 mg | ORAL_CAPSULE | Freq: Two times a day (BID) | ORAL | 0 refills | Status: AC

## 2022-09-30 MED ORDER — ACETAMINOPHEN 500 MG PO TABS
1000.0000 mg | ORAL_TABLET | Freq: Once | ORAL | Status: AC
  Administered 2022-09-30: 1000 mg via ORAL
  Filled 2022-09-30: qty 2

## 2022-09-30 MED ORDER — IPRATROPIUM-ALBUTEROL 0.5-2.5 (3) MG/3ML IN SOLN
3.0000 mL | Freq: Once | RESPIRATORY_TRACT | Status: AC
  Administered 2022-09-30: 3 mL via RESPIRATORY_TRACT
  Filled 2022-09-30: qty 3

## 2022-09-30 MED ORDER — OSELTAMIVIR PHOSPHATE 75 MG PO CAPS
75.0000 mg | ORAL_CAPSULE | Freq: Once | ORAL | Status: AC
  Administered 2022-09-30: 75 mg via ORAL
  Filled 2022-09-30: qty 1

## 2022-09-30 MED ORDER — ALBUTEROL SULFATE HFA 108 (90 BASE) MCG/ACT IN AERS
2.0000 | INHALATION_SPRAY | Freq: Four times a day (QID) | RESPIRATORY_TRACT | 2 refills | Status: AC | PRN
Start: 2022-09-30 — End: ?

## 2022-09-30 MED ORDER — PREDNISONE 10 MG PO TABS: 10.0000 mg | ORAL_TABLET | Freq: Every day | ORAL | 0 refills | Status: DC

## 2022-09-30 MED ORDER — METHYLPREDNISOLONE SODIUM SUCC 125 MG IJ SOLR
80.0000 mg | Freq: Once | INTRAMUSCULAR | Status: AC
  Administered 2022-09-30: 80 mg via INTRAMUSCULAR
  Filled 2022-09-30: qty 2

## 2022-09-30 MED ORDER — ALBUTEROL SULFATE (2.5 MG/3ML) 0.083% IN NEBU: 2.5000 mg | INHALATION_SOLUTION | RESPIRATORY_TRACT | 0 refills | Status: AC | PRN

## 2022-09-30 NOTE — ED Triage Notes (Signed)
Pt has a cough  Hx asthma.    States out of nebulizer medicine.  Sx began last night   pt reports feeling weak.   pt alert.

## 2022-09-30 NOTE — ED Provider Notes (Signed)
[facility] EMERGENCY DEPARTMENT Provider Note   CSN: [id number] Arrival date & time: 09/30/22  2001     History  Chief Complaint  Patient presents with   Cough    Amy Pennington is a 42 y.o. female.  Presents to the emergency department for evaluation of cough, wheezing, fevers chills and body aches.  Symptoms began last night.  She has not taken any medications for her chills body aches or fevers.  She denies any abdominal pain nausea vomiting or diarrhea.  No shortness of breath.  She has been wheezing and has been out of her albuterol inhaler and nebulizers  HPI     Home Medications Prior to Admission medications   Medication Sig Start Date End Date Taking? Authorizing Provider  albuterol (PROVENTIL) (2.5 MG/3ML) 0.083% nebulizer solution Take 3 mLs (2.5 mg total) by nebulization every 4 (four) hours as needed for wheezing or shortness of breath. 09/30/22 09/30/23 Yes Duanne Guess, PA-C  albuterol (VENTOLIN HFA) 108 (90 Base) MCG/ACT inhaler Inhale 2 puffs into the lungs every 6 (six) hours as needed for wheezing or shortness of breath. 09/30/22  Yes Duanne Guess, PA-C  oseltamivir (TAMIFLU) 75 MG capsule Take 1 capsule (75 mg total) by mouth 2 (two) times daily for 5 days. 09/30/22 10/05/22 Yes Duanne Guess, PA-C  predniSONE (DELTASONE) 10 MG tablet Take 1 tablet (10 mg total) by mouth daily. 6,5,4,3,2,1 six day taper 09/30/22  Yes Duanne Guess, PA-C  cyclobenzaprine (FLEXERIL) 10 MG tablet Take 1 tablet (10 mg total) by mouth 3 (three) times daily as needed for muscle spasms. 12/03/18   Arta Silence, MD  diazepam (VALIUM) 2 MG tablet Take 1 tablet (2 mg total) by mouth every 8 (eight) hours as needed for muscle spasms. 03/17/21   Paulette Blanch, MD  dicyclomine (BENTYL) 20 MG tablet Take 1 tablet (20 mg total) by mouth 4 (four) times daily -  before meals and at bedtime. 01/18/22   Vladimir Crofts, MD  ibuprofen (ADVIL) 800 MG tablet Take 1 tablet (800 mg  total) by mouth every 8 (eight) hours as needed for moderate pain. 03/17/21   Paulette Blanch, MD  ketorolac (TORADOL) 10 MG tablet Take 1 tablet (10 mg total) by mouth every 6 (six) hours as needed. 03/26/21   Triplett, Dessa Phi, FNP  Multiple Vitamins-Iron (MULTIVITAMIN/IRON PO) Take 1 tablet by mouth daily.    [provider]  Multiple Vitamins-Minerals (HAIR SKIN NAILS PO) Take 2 tablets by mouth daily.    [provider]  ondansetron (ZOFRAN-ODT) 4 MG disintegrating tablet Take 1 tablet (4 mg total) by mouth every 8 (eight) hours as needed. 01/18/22   Vladimir Crofts, MD  oxybutynin (DITROPAN) 5 MG tablet 1 tab tid prn frequency,urgency, bladder spasm 08/23/18   Stoioff, Ronda Fairly, MD  oxyCODONE-acetaminophen (PERCOCET/ROXICET) 5-325 MG tablet Take 1 tablet by mouth every 4 (four) hours as needed for severe pain. 03/17/21   Paulette Blanch, MD  Prenatal Vit-Fe Fumarate-FA (PRENATAL MULTIVITAMIN) TABS tablet Take 1 tablet by mouth daily at 12 noon.    [provider]  tiZANidine (ZANAFLEX) 4 MG tablet Take 1 tablet (4 mg total) by mouth 3 (three) times daily. 03/26/21   Victorino Dike, FNP      Allergies    Patient has no known allergies.    Review of Systems   Review of Systems  Physical Exam Updated Vital Signs BP 129/75 (BP Location: Left Arm)  Pulse (!) 117   Temp (!) 100.9 F (38.3 C) (Oral)   Resp (!) 22   Ht 5\' 3"  (1.6 m)   Wt 124.3 kg   LMP 08/31/2022   SpO2 95%   BMI 48.54 kg/m  Physical Exam Constitutional:      Appearance: She is well-developed.  HENT:     Head: Normocephalic and atraumatic.     Right Ear: Ear canal and external ear normal.     Left Ear: Ear canal and external ear normal.     Mouth/Throat:     Mouth: Mucous membranes are moist.     Pharynx: No oropharyngeal exudate or posterior oropharyngeal erythema.  Eyes:     Conjunctiva/sclera: Conjunctivae normal.  Cardiovascular:     Rate and Rhythm: Normal rate and regular rhythm.      Pulses: Normal pulses.     Heart sounds: Normal heart sounds.  Pulmonary:     Effort: Pulmonary effort is normal. No respiratory distress.     Breath sounds: Wheezing (Slight expiratory bilaterally) present.  Abdominal:     General: There is no distension.     Tenderness: There is no abdominal tenderness. There is no guarding.  Musculoskeletal:        General: Normal range of motion.     Cervical back: Normal range of motion.  Skin:    General: Skin is warm.     Findings: No rash.  Neurological:     General: No focal deficit present.     Mental Status: She is alert and oriented to person, place, and time. Mental status is at baseline.  Psychiatric:        Mood and Affect: Mood normal.        Behavior: Behavior normal.        Thought Content: Thought content normal.     ED Results / Procedures / Treatments   Labs (all labs ordered are listed, but only abnormal results are displayed) Labs Reviewed  RESP PANEL BY RT-PCR (RSV, FLU A&B, COVID)  RVPGX2 - Abnormal; Notable for the following components:      Result Value   Influenza A by PCR POSITIVE (*)    All other components within normal limits    EKG None  Radiology No results found.  Procedures Procedures    Medications Ordered in ED Medications  ipratropium-albuterol (DUONEB) 0.5-2.5 (3) MG/3ML nebulizer solution 3 mL (3 mLs Nebulization Given 09/30/22 2203)  acetaminophen (TYLENOL) tablet 1,000 mg (1,000 mg Oral Given 09/30/22 2204)  methylPREDNISolone sodium succinate (SOLU-MEDROL) 125 mg/2 mL injection 80 mg (80 mg Intramuscular Given 09/30/22 2203)  oseltamivir (TAMIFLU) capsule 75 mg (75 mg Oral Given 09/30/22 2204)    ED Course/ Medical Decision Making/ A&P                           Medical Decision Making Risk OTC drugs. Prescription drug management.   42 year old female with sudden onset of flu chills body aches cough and wheeze that began last night.  No chest pain or shortness of breath but she is having  some slight expiratory wheezing.  Influenza test positive.  Tonsils appear normal, lungs are clear to auscultation.  No abdominal tenderness.  Vital signs are stable.  She is given Tylenol, DuoNeb, Solu-Medrol and Tamiflu here in the ED.  She is discharged home with prednisone taper, albuterol inhaler and nebulizer as well as Tamiflu.  She understands signs and symptoms return to the ER  for. Final Clinical Impression(s) / ED Diagnoses Final diagnoses:  Influenza A    Rx / DC Orders ED Discharge Orders          Ordered    oseltamivir (TAMIFLU) 75 MG capsule  2 times daily        09/30/22 2133    albuterol (VENTOLIN HFA) 108 (90 Base) MCG/ACT inhaler  Every 6 hours PRN        09/30/22 2133    predniSONE (DELTASONE) 10 MG tablet  Daily        09/30/22 2133    albuterol (PROVENTIL) (2.5 MG/3ML) 0.083% nebulizer solution  Every 4 hours PRN        09/30/22 2133              Ronnette Juniper 09/30/22 2255    Sharyn Creamer, MD 10/11/22 1024

## 2022-09-30 NOTE — Discharge Instructions (Addendum)
Please take Tylenol and ibuprofen as needed for fevers chills body aches.  Make sure you are using albuterol as needed for any wheezing.  Return to the ER for any fevers over 101 that or not improving with Tylenol or ibuprofen, difficulty breathing, shortness of breath, wheezing or any urgent changes in your health.

## 2022-09-30 NOTE — ED Notes (Signed)
Patient verbalizes understanding of discharge instructions. Opportunity for questioning and answers were provided. Armband removed by staff, pt discharged from ED. Ambulated out to lobby with SO

## 2023-09-23 ENCOUNTER — Observation Stay
Admission: EM | Admit: 2023-09-23 | Discharge: 2023-09-24 | Disposition: A | Discharge status: Home / Self Care | Payer: Medicaid Other | Attending: General Surgery | Admitting: General Surgery

## 2023-09-23 ENCOUNTER — Other Ambulatory Visit: Payer: Self-pay

## 2023-09-23 ENCOUNTER — Emergency Department: Payer: Medicaid Other

## 2023-09-23 DIAGNOSIS — F1090 Alcohol use, unspecified, uncomplicated: Secondary | ICD-10-CM | POA: Diagnosis not present

## 2023-09-23 DIAGNOSIS — F1290 Cannabis use, unspecified, uncomplicated: Secondary | ICD-10-CM | POA: Insufficient documentation

## 2023-09-23 DIAGNOSIS — N611 Abscess of the breast and nipple: Principal | ICD-10-CM | POA: Insufficient documentation

## 2023-09-23 DIAGNOSIS — F17210 Nicotine dependence, cigarettes, uncomplicated: Secondary | ICD-10-CM | POA: Diagnosis not present

## 2023-09-23 LAB — CBC WITH DIFFERENTIAL/PLATELET
Abs Immature Granulocytes: 0.03 10*3/uL (ref 0.00–0.07)
Basophils Absolute: 0.1 10*3/uL (ref 0.0–0.1)
Basophils Relative: 1 %
Eosinophils Absolute: 0.3 10*3/uL (ref 0.0–0.5)
Eosinophils Relative: 3 %
HCT: 37.8 % (ref 36.0–46.0)
Hemoglobin: 11.8 g/dL — ABNORMAL LOW (ref 12.0–15.0)
Immature Granulocytes: 0 %
Lymphocytes Relative: 20 %
Lymphs Abs: 1.6 10*3/uL (ref 0.7–4.0)
MCH: 26.2 pg (ref 26.0–34.0)
MCHC: 31.2 g/dL (ref 30.0–36.0)
MCV: 84 fL (ref 80.0–100.0)
Monocytes Absolute: 0.5 10*3/uL (ref 0.1–1.0)
Monocytes Relative: 6 %
Neutro Abs: 5.6 10*3/uL (ref 1.7–7.7)
Neutrophils Relative %: 70 %
Platelets: 297 10*3/uL (ref 150–400)
RBC: 4.5 MIL/uL (ref 3.87–5.11)
RDW: 16 % — ABNORMAL HIGH (ref 11.5–15.5)
WBC: 8.1 10*3/uL (ref 4.0–10.5)
nRBC: 0 % (ref 0.0–0.2)

## 2023-09-23 MED ORDER — OXYCODONE HCL 5 MG PO TABS
5.0000 mg | ORAL_TABLET | Freq: Once | ORAL | Status: AC
  Administered 2023-09-23: 5 mg via ORAL
  Filled 2023-09-23: qty 1

## 2023-09-23 MED ORDER — IBUPROFEN 600 MG PO TABS
600.0000 mg | ORAL_TABLET | Freq: Once | ORAL | Status: AC
  Administered 2023-09-23: 600 mg via ORAL
  Filled 2023-09-23: qty 1

## 2023-09-23 MED ORDER — AMOXICILLIN-POT CLAVULANATE 875-125 MG PO TABS
1.0000 | ORAL_TABLET | Freq: Once | ORAL | Status: AC
  Administered 2023-09-23: 1 via ORAL
  Filled 2023-09-23: qty 1

## 2023-09-23 MED ORDER — FENTANYL CITRATE PF 50 MCG/ML IJ SOSY
50.0000 ug | PREFILLED_SYRINGE | Freq: Once | INTRAMUSCULAR | Status: AC
  Administered 2023-09-23: 50 ug via INTRAVENOUS
  Filled 2023-09-23: qty 1

## 2023-09-23 NOTE — ED Triage Notes (Signed)
Pt to ed frm home for breast in the left side. Pt denies any discharge. Pt states it is sore and hot. Pt is caox4, in no acute distress and ambulatory in triage, Pt advisd it has been ongoing for the last 4 years but in the last week it has gotten worse. Pt has seen a MD in the past and was told it might be a skin infection. Pt states "it looks like a cyst".

## 2023-09-23 NOTE — ED Notes (Signed)
Pt to ED with c/o left nipple pain onset 1 week ago. Pt offers that approx 3 years ago she had 3 lumps that she had biopsied with negative results. Offers that since then she has had intermittent drainage from the nipple however since the pain has started she has had no drainage. Areola is hard and tender to touch.

## 2023-09-23 NOTE — ED Provider Notes (Incomplete)
[facility] Provider Note    Event Date/Time   First MD Initiated Contact with Patient 09/23/23 2132     (approximate)   History   Breast Problem   HPI  GENEVIVE FARES is a 42 y.o. female with a history of asthma, depression, schizoaffective schizophrenia, bipolar 1 and as listed in EMR presents to the emergency department for treatment and evaluation of the left breast pain that has been ongoing for the past 10 days.  Symptoms have progressively worsened over the past 24 hours.  She now has a raised area over the nipple and the surrounding area is red.  She has been taking ibuprofen without significant relief.      Physical Exam   Triage Vital Signs: ED Triage Vitals  Encounter Vitals Group     BP 09/23/23 1828 119/77     Systolic BP Percentile --      Diastolic BP Percentile --      Pulse Rate 09/23/23 1828 82     Resp 09/23/23 1828 16     Temp 09/23/23 1828 98 F (36.7 C)     Temp Source 09/23/23 1828 Oral     SpO2 09/23/23 1828 98 %     Weight --      Height --      Head Circumference --      Peak Flow --      Pain Score 09/23/23 1728 8     Pain Loc --      Pain Education --      Exclude from Growth Chart --     Most recent vital signs: Vitals:   09/23/23 1828 09/23/23 2144  BP: 119/77 128/74  Pulse: 82 81  Resp: 16 18  Temp: 98 F (36.7 C)   SpO2: 98% 99%    General: Awake, no distress.  CV:  Good peripheral perfusion.  Resp:  Normal effort.  Abd:  No distention.  Other:  Fluctuant area over the left nipple with surrounding induration and erythema.   ED Results / Procedures / Treatments   Labs (all labs ordered are listed, but only abnormal results are displayed) Labs Reviewed  BASIC METABOLIC PANEL  CBC WITH DIFFERENTIAL/PLATELET     EKG  Not indicated.   RADIOLOGY  Image and radiology report reviewed and interpreted by me. Radiology report consistent with the same.  Ultrasound shows skin thickening with  lobulated 4 x 2.4 x 2.2 cm hypoechoic collection in the region of skin thickening.  PROCEDURES:  Critical Care performed: No  Procedures   MEDICATIONS ORDERED IN ED:  Medications  fentaNYL (SUBLIMAZE) injection 50 mcg (has no administration in time range)  oxyCODONE (Oxy IR/ROXICODONE) immediate release tablet 5 mg (5 mg Oral Given 09/23/23 2232)  ibuprofen (ADVIL) tablet 600 mg (600 mg Oral Given 09/23/23 2232)  amoxicillin-clavulanate (AUGMENTIN) 875-125 MG per tablet 1 tablet (1 tablet Oral Given 09/23/23 2232)     IMPRESSION / MDM / ASSESSMENT AND PLAN / ED COURSE   I have reviewed the triage note.  Differential diagnosis includes, but is not limited to, breast abscess, cellulitis, breast malignancy  Patient's presentation is most consistent with acute illness / injury with system symptoms.  42 year old female presenting to the emergency department for treatment and evaluation of left breast pain.  See HPI for further details.  On exam, there is an obvious abscess overlying the left nipple and the surrounding areola is indurated.  The skin of the left breast surrounding the  areola is erythematous.  Plan will be to try and get an ultrasound to evaluate extent of fluid collection, provide pain control, and antibiotics.  Patient is aware and agreeable to the plan.  ----------------------------------------- 11:28 PM on 09/23/2023 ----------------------------------------- Ultrasound shows abscess measuring 4 x 2.4 x 2.2 hypoechoic fluid collection.  Results discussed with the patient.  Plan will be to get basic labs and discuss with surgery since we do not have a breast surgeon, are or breast center availability at this time.  ----------------------------------------- 12:05 AM on 09/24/2023 ----------------------------------------- Discussed with Dr. Maurine Minister who will place in observation for tonight and perform I&D in the morning. Patient aware and agreeable to the plan. She was  also advised of NPO status.      FINAL CLINICAL IMPRESSION(S) / ED DIAGNOSES   Final diagnoses:  Breast abscess of female     Rx / DC Orders   ED Discharge Orders     None        Note:  This document was prepared using Dragon voice recognition software and may include unintentional dictation errors.   Chinita Pester, FNP 09/24/23 0006

## 2023-09-24 DIAGNOSIS — N611 Abscess of the breast and nipple: Secondary | ICD-10-CM

## 2023-09-24 LAB — BASIC METABOLIC PANEL
Anion gap: 5 (ref 5–15)
BUN: 11 mg/dL (ref 6–20)
CO2: 27 mmol/L (ref 22–32)
Calcium: 8.4 mg/dL — ABNORMAL LOW (ref 8.9–10.3)
Chloride: 104 mmol/L (ref 98–111)
Creatinine, Ser: 0.65 mg/dL (ref 0.44–1.00)
GFR, Estimated: >60 mL/min (ref >60)
Glucose, Bld: 124 mg/dL — ABNORMAL HIGH (ref 70–99)
Potassium: 3.7 mmol/L (ref 3.5–5.1)
Sodium: 136 mmol/L (ref 135–145)

## 2023-09-24 LAB — HIV ANTIBODY (ROUTINE TESTING W REFLEX): HIV Screen 4th Generation wRfx: NONREACTIVE

## 2023-09-24 MED ORDER — SULFAMETHOXAZOLE-TRIMETHOPRIM 800-160 MG PO TABS: 1.0000 | ORAL_TABLET | Freq: Two times a day (BID) | ORAL | 0 refills | Status: DC

## 2023-09-24 MED ORDER — MELATONIN 5 MG PO TABS
5.0000 mg | ORAL_TABLET | Freq: Every day | ORAL | Status: DC
  Administered 2023-09-24: 5 mg via ORAL
  Filled 2023-09-24: qty 1

## 2023-09-24 MED ORDER — LIDOCAINE HCL 1 % IJ SOLN
10.0000 mL | Freq: Once | INTRAMUSCULAR | Status: AC
  Filled 2023-09-24: qty 10

## 2023-09-24 MED ORDER — OXYCODONE HCL 5 MG PO TABS
5.0000 mg | ORAL_TABLET | ORAL | Status: DC | PRN
  Administered 2023-09-24 (×2): 5 mg via ORAL
  Administered 2023-09-24: 10 mg via ORAL
  Filled 2023-09-24 (×2): qty 2

## 2023-09-24 MED ORDER — LIDOCAINE HCL (PF) 1 % IJ SOLN
INTRAMUSCULAR | Status: AC
  Administered 2023-09-24: 10 mL via INTRADERMAL
  Filled 2023-09-24: qty 5

## 2023-09-24 MED ORDER — CEFAZOLIN SODIUM-DEXTROSE 2-4 GM/100ML-% IV SOLN
2.0000 g | Freq: Three times a day (TID) | INTRAVENOUS | Status: DC
  Filled 2023-09-24: qty 100

## 2023-09-24 MED ORDER — CEFAZOLIN SODIUM-DEXTROSE 2-4 GM/100ML-% IV SOLN
2.0000 g | Freq: Once | INTRAVENOUS | Status: AC
  Administered 2023-09-24: 2 g via INTRAVENOUS
  Filled 2023-09-24: qty 100

## 2023-09-24 NOTE — Discharge Summary (Signed)
Patient ID: Amy Pennington MRN: [id number] DOB/AGE: 1980/10/21 42 y.o.  Admit date: 09/23/2023 Discharge date: 09/24/2023   Discharge Diagnoses:  Principal Problem:   Breast abscess of female   Procedures:aspiration of breast abscess  Hospital Course:   admitted with findings consistent with acute breast abscess. She was admitted for observation. Her breast abscess was drained. She was discharged on antibiotics.    Consults: None  Disposition: Discharge disposition: 01-Home or Self Care       Discharge Instructions     Call MD for:  difficulty breathing, headache or visual disturbances   Complete by: As directed    Call MD for:  extreme fatigue   Complete by: As directed    Call MD for:  hives   Complete by: As directed    Call MD for:  persistant dizziness or light-headedness   Complete by: As directed    Call MD for:  persistant nausea and vomiting   Complete by: As directed    Call MD for:  redness, tenderness, or signs of infection (pain, swelling, redness, odor or green/yellow discharge around incision site)   Complete by: As directed    Call MD for:  severe uncontrolled pain   Complete by: As directed    Call MD for:  temperature >100.4   Complete by: As directed    Diet - low sodium heart healthy   Complete by: As directed    Discharge instructions   Complete by: As directed    You can cover your breast with a Band-Aid.  You can take Tylenol as needed for pain.   Increase activity slowly   Complete by: As directed         Follow-up Information     Kandis Cocking, MD Follow up in 1 week(s).   Specialty: General Surgery Contact information: [address] [place] [address] [phone]                  Baker Pierini, M.D.

## 2023-09-24 NOTE — ED Notes (Signed)
Med to come from pharmacy.

## 2023-09-24 NOTE — Op Note (Signed)
Procedure note Preoperative diagnosis left breast abscess Postoperative diagnosis same Surgeon: Baker Pierini, MD Procedure: Aspiration of left breast abscess  After informed consent was obtained the left breast was prepped and draped in the usual sterile fashion.  It was cleansed with chlorhexidine.  The patient's comfort was obtained with 1% lidocaine.  Using the ultrasound I located the area of fluid collection.  This was deep to thickened skin at 1:00 just 1 cm from the nipple.  I inserted an 18-gauge needle into this abscess and immediately aspirated approximately 3 cc of purulent fluid.  The needle was withdrawn and the breast was then dressed with gauze and tape.  The patient tolerated the procedure well.  The aspirate was sent for culture.

## 2023-09-24 NOTE — H&P (Signed)
Patient ID: Amy Pennington, female   DOB: 05-16-1981, 42 y.o.   MRN: [id number] CC: Breast Abscess History of Present Illness Amy Pennington is a 42 y.o. female with past medical history significant for morbid obesity who presents in consultation for a left breast abscess.  The patient reports that approximately 2 weeks ago she noticed some swelling in her left breast.  Over the last 2 weeks she has had increasing pain and is on been unable to wear a bra all secondary to the pain.  She also reports that she has had increased swelling and thickening of the skin just around her nipple.  She denies any nipple discharge.  She does have a history of a breast biopsy.  She last had a mammogram about a year and a half ago.  She denies ever being diagnosed with cancer and denies any family history of breast cancer, ovarian cancer or uterine cancer.  However, she is adopted but thinks that her birth parents did not have any of these ailments.  She denies any fevers or chills.  The pain is centered on the left breast just superior and lateral to the nipple..  Past Medical History Past Medical History:  Diagnosis Date   Asthma    Bipolar 1 disorder (HCC)    Depression    GERD (gastroesophageal reflux disease)    History of kidney stones    Migraines    Schizo affective schizophrenia (HCC)        Past Surgical History:  Procedure Laterality Date   CYSTOSCOPY W/ RETROGRADES  08/22/2018   Procedure: CYSTOSCOPY WITH RETROGRADE PYELOGRAM;  Surgeon: Riki Altes, MD;  Location: [facility] OR[facility];  Service: Urology;;   CYSTOSCOPY W/ URETERAL STENT REMOVAL Right 08/22/2018   Procedure: CYSTOSCOPY WITH STENT REMOVAL;  Surgeon: Riki Altes, MD;  Location: [facility] OR[facility];  Service: Urology;  Laterality: Right;   CYSTOSCOPY WITH HOLMIUM LASER LITHOTRIPSY Right 08/22/2018   Procedure: CYSTOSCOPY WITH HOLMIUM LASER LITHOTRIPSY;  Surgeon: Riki Altes, MD;  Location: [facility];  Service: Urology;  Laterality: Right;    CYSTOSCOPY WITH STENT PLACEMENT Right 10/28/2017   Procedure: CYSTOSCOPY WITH right ureteral STENT PLACEMENT;  Surgeon: Riki Altes, MD;  Location: [facility] OR[facility];  Service: Urology;  Laterality: Right;   CYSTOSCOPY WITH URETEROSCOPY Right 08/22/2018   Procedure: CYSTOSCOPY WITH URETEROSCOPY;  Surgeon: Riki Altes, MD;  Location: [facility] OR[facility];  Service: Urology;  Laterality: Right;   none      No Known Allergies  Current Facility-Administered Medications  Medication Dose Route Frequency Provider Last Rate Last Admin   ceFAZolin (ANCEF) IVPB 2g/100 mL premix  2 g Intravenous Q8H Kandis Cocking, MD       melatonin tablet 5 mg  5 mg Oral QHS Kandis Cocking, MD   5 mg at 09/24/23 0122   oxyCODONE (Oxy IR/ROXICODONE) immediate release tablet 5-10 mg  5-10 mg Oral Q4H PRN Kandis Cocking, MD   5 mg at 09/24/23 0907   Current Outpatient Medications  Medication Sig Dispense Refill   albuterol (PROVENTIL) (2.5 MG/3ML) 0.083% nebulizer solution Take 3 mLs (2.5 mg total) by nebulization every 4 (four) hours as needed for wheezing or shortness of breath. 75 mL 0   albuterol (VENTOLIN HFA) 108 (90 Base) MCG/ACT inhaler Inhale 2 puffs into the lungs every 6 (six) hours as needed for wheezing or shortness of breath. 8 g 2   ferrous sulfate 325 (65 FE) MG tablet Take 325 mg by mouth  daily with breakfast.     cyclobenzaprine (FLEXERIL) 10 MG tablet Take 1 tablet (10 mg total) by mouth 3 (three) times daily as needed for muscle spasms. (Patient not taking: Reported on 09/24/2023) 15 tablet 0   diazepam (VALIUM) 2 MG tablet Take 1 tablet (2 mg total) by mouth every 8 (eight) hours as needed for muscle spasms. (Patient not taking: Reported on 09/24/2023) 15 tablet 0   dicyclomine (BENTYL) 20 MG tablet Take 1 tablet (20 mg total) by mouth 4 (four) times daily -  before meals and at bedtime. (Patient not taking: Reported on 09/24/2023) 30 tablet 1   ibuprofen (ADVIL) 800 MG tablet Take 1 tablet (800 mg total) by  mouth every 8 (eight) hours as needed for moderate pain. (Patient not taking: Reported on 09/24/2023) 15 tablet 0   ketorolac (TORADOL) 10 MG tablet Take 1 tablet (10 mg total) by mouth every 6 (six) hours as needed. (Patient not taking: Reported on 09/24/2023) 20 tablet 0   Multiple Vitamins-Iron (MULTIVITAMIN/IRON PO) Take 1 tablet by mouth daily. (Patient not taking: Reported on 09/24/2023)     Multiple Vitamins-Minerals (HAIR SKIN NAILS PO) Take 2 tablets by mouth daily. (Patient not taking: Reported on 09/24/2023)     ondansetron (ZOFRAN-ODT) 4 MG disintegrating tablet Take 1 tablet (4 mg total) by mouth every 8 (eight) hours as needed. (Patient not taking: Reported on 09/24/2023) 20 tablet 0   oxybutynin (DITROPAN) 5 MG tablet 1 tab tid prn frequency,urgency, bladder spasm (Patient not taking: Reported on 09/24/2023) 30 tablet 0   oxyCODONE-acetaminophen (PERCOCET/ROXICET) 5-325 MG tablet Take 1 tablet by mouth every 4 (four) hours as needed for severe pain. (Patient not taking: Reported on 09/24/2023) 15 tablet 0   predniSONE (DELTASONE) 10 MG tablet Take 1 tablet (10 mg total) by mouth daily. 6,5,4,3,2,1 six day taper (Patient not taking: Reported on 09/24/2023) 21 tablet 0   Prenatal Vit-Fe Fumarate-FA (PRENATAL MULTIVITAMIN) TABS tablet Take 1 tablet by mouth daily at 12 noon. (Patient not taking: Reported on 09/24/2023)     tiZANidine (ZANAFLEX) 4 MG tablet Take 1 tablet (4 mg total) by mouth 3 (three) times daily. (Patient not taking: Reported on 09/24/2023) 30 tablet 0    Family History Family History  Adopted: Yes  Problem Relation Age of Onset   Asthma Mother    Depression Mother    Diabetes Brother        Social History Social History   Tobacco Use   Smoking status: Every Day    Current packs/day: 0.25    Average packs/day: 0.3 packs/day for 15.0 years (3.8 ttl pk-yrs)    Types: Cigarettes   Smokeless tobacco: Former    Types: Snuff    Quit date: 2005   Tobacco  comments:    pt states that she is thinking about alternatives  Vaping Use   Vaping status: Former  Substance Use Topics   Alcohol use: Yes    Comment: occas   Drug use: Yes    Frequency: 2.0 times per week    Types: Cocaine, Marijuana    Comment: Cocaine and Marijuana once or twice a week        ROS Full ROS of systems performed and is otherwise negative there than what is stated in the HPI  Physical Exam Blood pressure (!) 108/58, pulse 80, temperature 98.4 F (36.9 C), temperature source Oral, resp. rate 18, SpO2 100%.  Alert and oriented x 3, normal work of breathing on room air, regular  rate and rhythm, PERRLA, moving all extremities spontaneously, breast exam performed in the presence of a chaperone.  Left breast just superior to the nipple and lateral at about the 1 o'clock position 1 cm from the nipple there is overlying skin thickening.  I do not feel an obvious area of fluctuance given the skin thickening.  There is no redness.  Her lateral breast does have some dimpling to it.  There is no erythema to the breast.  She has no other dominant masses and no axillary lymphadenopathy.  I did not examine the right breast today.  Data Reviewed Ultrasound consistent with a fluid collection underneath the thickened skin  I have personally reviewed the patient's imaging and medical records.    Assessment/Plan    Ms. Crume is a 42 year old who developed a left breast abscess.  I have ultrasounded it myself and there is a pocket for aspiration.  We will plan for left breast aspiration.  I discussed the risk benefits alternatives of the procedure with the patient including risk of infection, bleeding and potential recurrence of the breast abscess.  She will need to be discharged on Bactrim     Kandis Cocking 09/24/2023, 9:54 AM

## 2023-09-24 NOTE — ED Notes (Signed)
Pt resting with eyes closed. Respirations even and non labored. CB remains within reach. Family on stretcher with patient.

## 2023-09-24 NOTE — ED Notes (Signed)
Report given to [facility]. Pt to be moved to room 36

## 2023-09-28 ENCOUNTER — Emergency Department: Payer: Medicaid Other

## 2023-09-28 ENCOUNTER — Inpatient Hospital Stay
Admission: EM | Admit: 2023-09-28 | Discharge: 2023-10-03 | DRG: 579 | Disposition: A | Discharge status: Home / Self Care | Payer: Medicaid Other | Attending: Obstetrics and Gynecology | Admitting: Obstetrics and Gynecology

## 2023-09-28 ENCOUNTER — Encounter: Payer: Self-pay | Admitting: Emergency Medicine

## 2023-09-28 ENCOUNTER — Other Ambulatory Visit: Payer: Self-pay

## 2023-09-28 ENCOUNTER — Encounter
Admission: EM | Disposition: A | Discharge status: Home / Self Care | Payer: Self-pay | Source: Home / Self Care | Attending: Obstetrics and Gynecology

## 2023-09-28 ENCOUNTER — Emergency Department: Payer: Medicaid Other | Admitting: Anesthesiology

## 2023-09-28 DIAGNOSIS — R102 Pelvic and perineal pain: Principal | ICD-10-CM

## 2023-09-28 DIAGNOSIS — F17210 Nicotine dependence, cigarettes, uncomplicated: Secondary | ICD-10-CM | POA: Diagnosis present

## 2023-09-28 DIAGNOSIS — M352 Behcet's disease: Secondary | ICD-10-CM | POA: Diagnosis present

## 2023-09-28 DIAGNOSIS — Z87442 Personal history of urinary calculi: Secondary | ICD-10-CM

## 2023-09-28 DIAGNOSIS — Z818 Family history of other mental and behavioral disorders: Secondary | ICD-10-CM

## 2023-09-28 DIAGNOSIS — L511 Stevens-Johnson syndrome: Principal | ICD-10-CM | POA: Diagnosis present

## 2023-09-28 DIAGNOSIS — A5901 Trichomonal vulvovaginitis: Secondary | ICD-10-CM | POA: Diagnosis present

## 2023-09-28 DIAGNOSIS — E662 Morbid (severe) obesity with alveolar hypoventilation: Secondary | ICD-10-CM | POA: Diagnosis present

## 2023-09-28 DIAGNOSIS — I10 Essential (primary) hypertension: Secondary | ICD-10-CM | POA: Diagnosis present

## 2023-09-28 DIAGNOSIS — J9601 Acute respiratory failure with hypoxia: Secondary | ICD-10-CM | POA: Diagnosis present

## 2023-09-28 DIAGNOSIS — F1490 Cocaine use, unspecified, uncomplicated: Secondary | ICD-10-CM | POA: Diagnosis present

## 2023-09-28 DIAGNOSIS — F32A Depression, unspecified: Secondary | ICD-10-CM | POA: Diagnosis present

## 2023-09-28 DIAGNOSIS — N390 Urinary tract infection, site not specified: Secondary | ICD-10-CM

## 2023-09-28 DIAGNOSIS — J45909 Unspecified asthma, uncomplicated: Secondary | ICD-10-CM | POA: Diagnosis present

## 2023-09-28 DIAGNOSIS — N766 Ulceration of vulva: Secondary | ICD-10-CM | POA: Diagnosis present

## 2023-09-28 DIAGNOSIS — R29818 Other symptoms and signs involving the nervous system: Secondary | ICD-10-CM | POA: Insufficient documentation

## 2023-09-28 DIAGNOSIS — T368X5A Adverse effect of other systemic antibiotics, initial encounter: Secondary | ICD-10-CM | POA: Diagnosis present

## 2023-09-28 DIAGNOSIS — K1370 Unspecified lesions of oral mucosa: Secondary | ICD-10-CM | POA: Diagnosis present

## 2023-09-28 DIAGNOSIS — N761 Subacute and chronic vaginitis: Secondary | ICD-10-CM | POA: Diagnosis present

## 2023-09-28 DIAGNOSIS — N764 Abscess of vulva: Secondary | ICD-10-CM | POA: Diagnosis present

## 2023-09-28 DIAGNOSIS — K121 Other forms of stomatitis: Secondary | ICD-10-CM | POA: Diagnosis present

## 2023-09-28 DIAGNOSIS — N765 Ulceration of vagina: Secondary | ICD-10-CM | POA: Diagnosis present

## 2023-09-28 DIAGNOSIS — R339 Retention of urine, unspecified: Secondary | ICD-10-CM | POA: Diagnosis present

## 2023-09-28 DIAGNOSIS — R7303 Prediabetes: Secondary | ICD-10-CM | POA: Diagnosis present

## 2023-09-28 DIAGNOSIS — R739 Hyperglycemia, unspecified: Secondary | ICD-10-CM

## 2023-09-28 DIAGNOSIS — N760 Acute vaginitis: Secondary | ICD-10-CM | POA: Diagnosis present

## 2023-09-28 DIAGNOSIS — K219 Gastro-esophageal reflux disease without esophagitis: Secondary | ICD-10-CM | POA: Diagnosis present

## 2023-09-28 DIAGNOSIS — Z1152 Encounter for screening for COVID-19: Secondary | ICD-10-CM

## 2023-09-28 DIAGNOSIS — N762 Acute vulvitis: Secondary | ICD-10-CM | POA: Diagnosis present

## 2023-09-28 DIAGNOSIS — Z825 Family history of asthma and other chronic lower respiratory diseases: Secondary | ICD-10-CM

## 2023-09-28 DIAGNOSIS — N611 Abscess of the breast and nipple: Secondary | ICD-10-CM | POA: Diagnosis present

## 2023-09-28 DIAGNOSIS — F259 Schizoaffective disorder, unspecified: Secondary | ICD-10-CM | POA: Diagnosis present

## 2023-09-28 DIAGNOSIS — K120 Recurrent oral aphthae: Secondary | ICD-10-CM | POA: Diagnosis present

## 2023-09-28 DIAGNOSIS — Z833 Family history of diabetes mellitus: Secondary | ICD-10-CM

## 2023-09-28 DIAGNOSIS — A599 Trichomoniasis, unspecified: Secondary | ICD-10-CM | POA: Diagnosis present

## 2023-09-28 DIAGNOSIS — K5090 Crohn's disease, unspecified, without complications: Secondary | ICD-10-CM | POA: Diagnosis present

## 2023-09-28 HISTORY — PX: INCISION AND DRAINAGE ABSCESS: SHX5864

## 2023-09-28 LAB — BASIC METABOLIC PANEL
Anion gap: 12 (ref 5–15)
BUN: 13 mg/dL (ref 6–20)
CO2: 22 mmol/L (ref 22–32)
Calcium: 8.9 mg/dL (ref 8.9–10.3)
Chloride: 104 mmol/L (ref 98–111)
Creatinine, Ser: 0.92 mg/dL (ref 0.44–1.00)
GFR, Estimated: >60 mL/min (ref >60)
Glucose, Bld: 100 mg/dL — ABNORMAL HIGH (ref 70–99)
Potassium: 4 mmol/L (ref 3.5–5.1)
Sodium: 138 mmol/L (ref 135–145)

## 2023-09-28 LAB — URINALYSIS, ROUTINE W REFLEX MICROSCOPIC
Bilirubin Urine: NEGATIVE
Glucose, UA: NEGATIVE mg/dL
Hgb urine dipstick: NEGATIVE
Ketones, ur: NEGATIVE mg/dL
Nitrite: NEGATIVE
Protein, ur: 30 mg/dL — AB
Specific Gravity, Urine: 1.026 (ref 1.005–1.030)
WBC, UA: >50 WBC/hpf (ref 0–5)
pH: 6 (ref 5.0–8.0)

## 2023-09-28 LAB — HEPATIC FUNCTION PANEL
ALT: 13 U/L (ref 0–44)
AST: 14 U/L — ABNORMAL LOW (ref 15–41)
Albumin: 3.5 g/dL (ref 3.5–5.0)
Alkaline Phosphatase: 45 U/L (ref 38–126)
Bilirubin, Direct: <0.1 mg/dL (ref 0.0–0.2)
Total Bilirubin: 0.4 mg/dL (ref 0.0–1.2)
Total Protein: 6.5 g/dL (ref 6.5–8.1)

## 2023-09-28 LAB — CBC WITH DIFFERENTIAL/PLATELET
Abs Immature Granulocytes: 0.02 10*3/uL (ref 0.00–0.07)
Basophils Absolute: 0.1 10*3/uL (ref 0.0–0.1)
Basophils Relative: 1 %
Eosinophils Absolute: 0.3 10*3/uL (ref 0.0–0.5)
Eosinophils Relative: 3 %
HCT: 38.2 % (ref 36.0–46.0)
Hemoglobin: 12.5 g/dL (ref 12.0–15.0)
Immature Granulocytes: 0 %
Lymphocytes Relative: 15 %
Lymphs Abs: 1.5 10*3/uL (ref 0.7–4.0)
MCH: 26.8 pg (ref 26.0–34.0)
MCHC: 32.7 g/dL (ref 30.0–36.0)
MCV: 82 fL (ref 80.0–100.0)
Monocytes Absolute: 0.6 10*3/uL (ref 0.1–1.0)
Monocytes Relative: 6 %
Neutro Abs: 7.5 10*3/uL (ref 1.7–7.7)
Neutrophils Relative %: 75 %
Platelets: 325 10*3/uL (ref 150–400)
RBC: 4.66 MIL/uL (ref 3.87–5.11)
RDW: 15.6 % — ABNORMAL HIGH (ref 11.5–15.5)
WBC: 9.9 10*3/uL (ref 4.0–10.5)
nRBC: 0 % (ref 0.0–0.2)

## 2023-09-28 LAB — TYPE AND SCREEN
ABO/RH(D): A POS
Antibody Screen: NEGATIVE

## 2023-09-28 LAB — RESP PANEL BY RT-PCR (RSV, FLU A&B, COVID)  RVPGX2
Influenza A by PCR: NEGATIVE
Influenza B by PCR: NEGATIVE
Resp Syncytial Virus by PCR: NEGATIVE
SARS Coronavirus 2 by RT PCR: NEGATIVE

## 2023-09-28 LAB — LACTIC ACID, PLASMA: Lactic Acid, Venous: 1 mmol/L (ref 0.5–1.9)

## 2023-09-28 LAB — URINE DRUG SCREEN, QUALITATIVE (ARMC ONLY)
Amphetamines, Ur Screen: NOT DETECTED
Barbiturates, Ur Screen: NOT DETECTED
Benzodiazepine, Ur Scrn: NOT DETECTED
Cannabinoid 50 Ng, Ur ~~LOC~~: NOT DETECTED
Cocaine Metabolite,Ur ~~LOC~~: POSITIVE — AB
MDMA (Ecstasy)Ur Screen: NOT DETECTED
Methadone Scn, Ur: NOT DETECTED
Opiate, Ur Screen: NOT DETECTED
Phencyclidine (PCP) Ur S: NOT DETECTED
Tricyclic, Ur Screen: NOT DETECTED

## 2023-09-28 LAB — CHLAMYDIA/NGC RT PCR (ARMC ONLY)
Chlamydia Tr: NOT DETECTED
Chlamydia Tr: NOT DETECTED
N gonorrhoeae: NOT DETECTED
N gonorrhoeae: NOT DETECTED

## 2023-09-28 LAB — SEDIMENTATION RATE: Sed Rate: 30 mm/h — ABNORMAL HIGH (ref 0–20)

## 2023-09-28 LAB — WET PREP, GENITAL
Sperm: NONE SEEN
WBC, Wet Prep HPF POC: >=10 — AB (ref <10)
Yeast Wet Prep HPF POC: NONE SEEN

## 2023-09-28 LAB — PREGNANCY, URINE: Preg Test, Ur: NEGATIVE

## 2023-09-28 LAB — CBG MONITORING, ED: Glucose-Capillary: 93 mg/dL (ref 70–99)

## 2023-09-28 SURGERY — EXAM UNDER ANESTHESIA, PELVIC
Anesthesia: General

## 2023-09-28 MED ORDER — DEXMEDETOMIDINE HCL IN NACL 400 MCG/100ML IV SOLN
INTRAVENOUS | Status: DC | PRN
  Administered 2023-09-28: 4 ug via INTRAVENOUS
  Administered 2023-09-28: 8 ug via INTRAVENOUS

## 2023-09-28 MED ORDER — HYDROCORTISONE 1 % EX CREA
TOPICAL_CREAM | Freq: Three times a day (TID) | CUTANEOUS | Status: DC
  Administered 2023-09-28 – 2023-09-30 (×2): 1 via TOPICAL
  Filled 2023-09-28 (×2): qty 28

## 2023-09-28 MED ORDER — LIDOCAINE HCL (CARDIAC) PF 100 MG/5ML IV SOSY
PREFILLED_SYRINGE | INTRAVENOUS | Status: DC | PRN
  Administered 2023-09-28: 100 mg via INTRAVENOUS

## 2023-09-28 MED ORDER — PIPERACILLIN-TAZOBACTAM 3.375 G IVPB
3.3750 g | Freq: Once | INTRAVENOUS | Status: AC
  Administered 2023-09-28: 3.375 g via INTRAVENOUS
  Filled 2023-09-28: qty 50

## 2023-09-28 MED ORDER — PROPOFOL 10 MG/ML IV BOLUS
INTRAVENOUS | Status: DC | PRN
  Administered 2023-09-28: 200 mg via INTRAVENOUS
  Administered 2023-09-28: 100 mg via INTRAVENOUS

## 2023-09-28 MED ORDER — PHENYLEPHRINE 80 MCG/ML (10ML) SYRINGE FOR IV PUSH (FOR BLOOD PRESSURE SUPPORT)
PREFILLED_SYRINGE | INTRAVENOUS | Status: DC | PRN
  Administered 2023-09-28: 80 ug via INTRAVENOUS

## 2023-09-28 MED ORDER — LACTATED RINGERS IV SOLN: INTRAVENOUS | Status: DC | PRN

## 2023-09-28 MED ORDER — FENTANYL CITRATE (PF) 100 MCG/2ML IJ SOLN
INTRAMUSCULAR | Status: AC
  Filled 2023-09-28: qty 2

## 2023-09-28 MED ORDER — DEXAMETHASONE SODIUM PHOSPHATE 10 MG/ML IJ SOLN
INTRAMUSCULAR | Status: DC | PRN
  Administered 2023-09-28: 4 mg via INTRAVENOUS

## 2023-09-28 MED ORDER — LIDOCAINE HCL (PF) 1 % IJ SOLN
INTRAMUSCULAR | Status: AC
  Filled 2023-09-28: qty 30

## 2023-09-28 MED ORDER — FLUCONAZOLE 50 MG PO TABS
150.0000 mg | ORAL_TABLET | Freq: Once | ORAL | Status: AC
  Administered 2023-09-28: 150 mg via ORAL
  Filled 2023-09-28: qty 1

## 2023-09-28 MED ORDER — SILVER NITRATE-POT NITRATE 75-25 % EX MISC
CUTANEOUS | Status: DC | PRN
  Administered 2023-09-28: 3 via TOPICAL

## 2023-09-28 MED ORDER — GABAPENTIN 300 MG PO CAPS
ORAL_CAPSULE | ORAL | Status: AC
  Filled 2023-09-28: qty 1

## 2023-09-28 MED ORDER — METRONIDAZOLE 500 MG PO TABS
500.0000 mg | ORAL_TABLET | Freq: Two times a day (BID) | ORAL | Status: AC
  Administered 2023-09-28 – 2023-10-01 (×7): 500 mg via ORAL
  Filled 2023-09-28 (×8): qty 1

## 2023-09-28 MED ORDER — ONDANSETRON HCL 4 MG/2ML IJ SOLN
INTRAMUSCULAR | Status: AC
  Filled 2023-09-28: qty 2

## 2023-09-28 MED ORDER — LIDOCAINE HCL (PF) 2 % IJ SOLN
INTRAMUSCULAR | Status: AC
  Filled 2023-09-28: qty 5

## 2023-09-28 MED ORDER — PRENATAL MULTIVITAMIN CH
1.0000 | ORAL_TABLET | Freq: Every day | ORAL | Status: DC
Start: 2023-09-29 — End: 2023-09-29
  Filled 2023-09-28: qty 1

## 2023-09-28 MED ORDER — GABAPENTIN 300 MG PO CAPS
300.0000 mg | ORAL_CAPSULE | ORAL | Status: AC
  Administered 2023-09-28: 300 mg via ORAL

## 2023-09-28 MED ORDER — KETOROLAC TROMETHAMINE 30 MG/ML IJ SOLN
30.0000 mg | Freq: Once | INTRAMUSCULAR | Status: AC
  Administered 2023-09-28: 30 mg via INTRAMUSCULAR
  Filled 2023-09-28: qty 1

## 2023-09-28 MED ORDER — LIDOCAINE HCL URETHRAL/MUCOSAL 2 % EX GEL
1.0000 | CUTANEOUS | Status: DC | PRN
Start: 2023-09-28 — End: 2023-10-03
  Administered 2023-09-28 – 2023-10-02 (×13): 1 via TOPICAL
  Filled 2023-09-28 (×16): qty 5

## 2023-09-28 MED ORDER — DROPERIDOL 2.5 MG/ML IJ SOLN
0.6250 mg | Freq: Once | INTRAMUSCULAR | Status: DC | PRN
Start: 2023-09-28 — End: 2023-09-28

## 2023-09-28 MED ORDER — IOHEXOL 300 MG/ML  SOLN
100.0000 mL | Freq: Once | INTRAMUSCULAR | Status: AC | PRN
Start: 2023-09-28 — End: 2023-09-28
  Administered 2023-09-28: 100 mL via INTRAVENOUS

## 2023-09-28 MED ORDER — SUCCINYLCHOLINE CHLORIDE 200 MG/10ML IV SOSY
PREFILLED_SYRINGE | INTRAVENOUS | Status: DC | PRN
  Administered 2023-09-28: 120 mg via INTRAVENOUS

## 2023-09-28 MED ORDER — SODIUM CHLORIDE 0.9 % IV BOLUS
1000.0000 mL | Freq: Once | INTRAVENOUS | Status: AC
  Administered 2023-09-28: 1000 mL via INTRAVENOUS

## 2023-09-28 MED ORDER — ONDANSETRON HCL 4 MG/2ML IJ SOLN
INTRAMUSCULAR | Status: DC | PRN
  Administered 2023-09-28: 4 mg via INTRAVENOUS

## 2023-09-28 MED ORDER — CEFAZOLIN SODIUM-DEXTROSE 2-3 GM-%(50ML) IV SOLR
INTRAVENOUS | Status: DC | PRN
  Administered 2023-09-28: 3 g via INTRAVENOUS

## 2023-09-28 MED ORDER — FENTANYL CITRATE PF 50 MCG/ML IJ SOSY
50.0000 ug | PREFILLED_SYRINGE | Freq: Once | INTRAMUSCULAR | Status: DC
Start: 2023-09-28 — End: 2023-10-03

## 2023-09-28 MED ORDER — FENTANYL CITRATE (PF) 100 MCG/2ML IJ SOLN
INTRAMUSCULAR | Status: DC | PRN
  Administered 2023-09-28: 100 ug via INTRAVENOUS

## 2023-09-28 MED ORDER — ESTROGENS CONJUGATED 0.625 MG/GM VA CREA
1.0000 | TOPICAL_CREAM | Freq: Every day | VAGINAL | Status: DC
  Administered 2023-09-28 – 2023-10-02 (×5): 1 via VAGINAL
  Filled 2023-09-28: qty 30

## 2023-09-28 MED ORDER — SILVER NITRATE-POT NITRATE 75-25 % EX MISC
CUTANEOUS | Status: AC
  Filled 2023-09-28: qty 10

## 2023-09-28 MED ORDER — HYDROMORPHONE HCL 1 MG/ML IJ SOLN
INTRAMUSCULAR | Status: AC
  Filled 2023-09-28: qty 1

## 2023-09-28 MED ORDER — ONDANSETRON 4 MG PO TBDP: 4.0000 mg | ORAL_TABLET | Freq: Four times a day (QID) | ORAL | Status: DC | PRN

## 2023-09-28 MED ORDER — 0.9 % SODIUM CHLORIDE (POUR BTL) OPTIME
TOPICAL | Status: DC | PRN
  Administered 2023-09-28: 1000 mL

## 2023-09-28 MED ORDER — MIDAZOLAM HCL 2 MG/2ML IJ SOLN
INTRAMUSCULAR | Status: DC | PRN
  Administered 2023-09-28: 2 mg via INTRAVENOUS

## 2023-09-28 MED ORDER — LIDOCAINE HCL 1 % IJ SOLN
INTRAMUSCULAR | Status: DC | PRN
  Administered 2023-09-28: 10 mL

## 2023-09-28 MED ORDER — DEXAMETHASONE SODIUM PHOSPHATE 10 MG/ML IJ SOLN
INTRAMUSCULAR | Status: AC
  Filled 2023-09-28: qty 1

## 2023-09-28 MED ORDER — OXYCODONE HCL 5 MG PO TABS
5.0000 mg | ORAL_TABLET | ORAL | Status: DC | PRN
Start: 2023-09-28 — End: 2023-10-03
  Administered 2023-09-28: 5 mg via ORAL
  Administered 2023-09-29: 10 mg via ORAL
  Administered 2023-09-29 (×2): 5 mg via ORAL
  Administered 2023-09-30 – 2023-10-01 (×7): 10 mg via ORAL
  Administered 2023-10-02: 5 mg via ORAL
  Administered 2023-10-02: 10 mg via ORAL
  Filled 2023-09-28 (×2): qty 1
  Filled 2023-09-28 (×6): qty 2
  Filled 2023-09-28: qty 1
  Filled 2023-09-28 (×2): qty 2
  Filled 2023-09-28: qty 1
  Filled 2023-09-28: qty 2

## 2023-09-28 MED ORDER — FENTANYL CITRATE (PF) 100 MCG/2ML IJ SOLN
25.0000 ug | INTRAMUSCULAR | Status: DC | PRN
Start: 2023-09-28 — End: 2023-09-28
  Administered 2023-09-28: 25 ug via INTRAVENOUS
  Administered 2023-09-28 (×2): 50 ug via INTRAVENOUS

## 2023-09-28 MED ORDER — AMOXICILLIN-POT CLAVULANATE 875-125 MG PO TABS
1.0000 | ORAL_TABLET | Freq: Two times a day (BID) | ORAL | Status: DC
  Administered 2023-09-28 – 2023-10-03 (×10): 1 via ORAL
  Filled 2023-09-28 (×11): qty 1

## 2023-09-28 MED ORDER — MIDAZOLAM HCL 2 MG/2ML IJ SOLN
INTRAMUSCULAR | Status: AC
  Filled 2023-09-28: qty 2

## 2023-09-28 MED ORDER — ACETAMINOPHEN 500 MG PO TABS: 1000.0000 mg | ORAL_TABLET | ORAL | Status: DC

## 2023-09-28 MED ORDER — OXYCODONE HCL 5 MG PO TABS
5.0000 mg | ORAL_TABLET | ORAL | Status: DC | PRN
  Administered 2023-09-28: 5 mg via ORAL
  Filled 2023-09-28: qty 1

## 2023-09-28 MED ORDER — LIDOCAINE-EPINEPHRINE 1 %-1:100000 IJ SOLN
INTRAMUSCULAR | Status: AC
Start: 2023-09-28 — End: ?
  Filled 2023-09-28: qty 1

## 2023-09-28 MED ORDER — LIDOCAINE VISCOUS HCL 2 % MT SOLN
15.0000 mL | OROMUCOSAL | Status: DC | PRN
  Administered 2023-09-28 – 2023-09-30 (×4): 15 mL via OROMUCOSAL
  Filled 2023-09-28 (×7): qty 15

## 2023-09-28 MED ORDER — PROPOFOL 10 MG/ML IV BOLUS
INTRAVENOUS | Status: AC
Start: 2023-09-28 — End: ?
  Filled 2023-09-28: qty 40

## 2023-09-28 MED ORDER — ACETAMINOPHEN 10 MG/ML IV SOLN
1000.0000 mg | Freq: Four times a day (QID) | INTRAVENOUS | Status: DC
  Administered 2023-09-28: 1000 mg via INTRAVENOUS
  Filled 2023-09-28 (×4): qty 100

## 2023-09-28 MED ORDER — LIDOCAINE-EPINEPHRINE 1 %-1:100000 IJ SOLN
INTRAMUSCULAR | Status: DC | PRN
  Administered 2023-09-28: 10 mL

## 2023-09-28 MED ORDER — ONDANSETRON HCL 4 MG/2ML IJ SOLN: 4.0000 mg | Freq: Four times a day (QID) | INTRAMUSCULAR | Status: DC | PRN

## 2023-09-28 MED ORDER — ACETAMINOPHEN 500 MG PO TABS
1000.0000 mg | ORAL_TABLET | Freq: Three times a day (TID) | ORAL | Status: DC
  Administered 2023-09-28 – 2023-10-02 (×11): 1000 mg via ORAL
  Filled 2023-09-28 (×12): qty 2

## 2023-09-28 MED ORDER — SUCCINYLCHOLINE CHLORIDE 200 MG/10ML IV SOSY
PREFILLED_SYRINGE | INTRAVENOUS | Status: AC
  Filled 2023-09-28: qty 10

## 2023-09-28 MED ORDER — HYDROMORPHONE HCL 1 MG/ML IJ SOLN
INTRAMUSCULAR | Status: DC | PRN
  Administered 2023-09-28: 1 mg via INTRAVENOUS

## 2023-09-28 SURGICAL SUPPLY — 43 items
APPLICATOR COTTON TIP 6 STRL (MISCELLANEOUS) IMPLANT
APPLICATOR COTTON TIP 6IN STRL (MISCELLANEOUS) IMPLANT
BLADE SURG 15 STRL LF DISP TIS (BLADE) ×2 IMPLANT
CHLORAPREP W/TINT 26 (MISCELLANEOUS) IMPLANT
COVER PROBE FLX POLY STRL (MISCELLANEOUS) IMPLANT
DEVICE MYOSURE LITE (MISCELLANEOUS) IMPLANT
DEVICE MYOSURE REACH (MISCELLANEOUS) IMPLANT
DRAPE LAPAROTOMY 100X77 ABD (DRAPES) ×2 IMPLANT
DRSG TELFA 3X8 NADH STRL (GAUZE/BANDAGES/DRESSINGS) ×2 IMPLANT
ELECT REM PT RETURN 9FT ADLT (ELECTROSURGICAL) ×2
ELECTRODE REM PT RTRN 9FT ADLT (ELECTROSURGICAL) ×2 IMPLANT
GAUZE 4X4 16PLY ~~LOC~~+RFID DBL (SPONGE) IMPLANT
GAUZE PACKING IODOFORM 1/2INX (GAUZE/BANDAGES/DRESSINGS) IMPLANT
GLOVE BIOGEL PI IND STRL 7.0 (GLOVE) ×2 IMPLANT
GLOVE SURG SYN 6.5 ES PF (GLOVE) ×6 IMPLANT
GLOVE SURG SYN 6.5 PF PI (GLOVE) ×6 IMPLANT
GOWN STRL REUS W/ TWL LRG LVL3 (GOWN DISPOSABLE) ×6 IMPLANT
IV NS IRRIG 3000ML ARTHROMATIC (IV SOLUTION) ×2 IMPLANT
KIT PROCEDURE FLUENT (KITS) IMPLANT
KIT TURNOVER CYSTO (KITS) ×2 IMPLANT
LABEL OR SOLS (LABEL) ×2 IMPLANT
MANIFOLD NEPTUNE II (INSTRUMENTS) ×4 IMPLANT
MAT HALF PREVALON HALF STRYKER (MISCELLANEOUS) IMPLANT
NDL HYPO 22X1.5 SAFETY MO (MISCELLANEOUS) IMPLANT
NEEDLE HYPO 22X1.5 SAFETY MO (MISCELLANEOUS) IMPLANT
NS IRRIG 500ML POUR BTL (IV SOLUTION) ×2 IMPLANT
PACK BASIN MINOR ARMC (MISCELLANEOUS) ×2 IMPLANT
PACK DNC HYST (MISCELLANEOUS) IMPLANT
PUNCH BIOPSY 4MM (MISCELLANEOUS) ×2
PUNCH BIOPSY DISP 4 (MISCELLANEOUS) IMPLANT
SCRUB CHG 4% DYNA-HEX 4OZ (MISCELLANEOUS) ×2 IMPLANT
SEAL ROD LENS SCOPE MYOSURE (ABLATOR) ×2 IMPLANT
SOL PREP PVP 2OZ (MISCELLANEOUS)
SOLUTION PREP PVP 2OZ (MISCELLANEOUS) IMPLANT
SPONGE T-LAP 18X18 ~~LOC~~+RFID (SPONGE) IMPLANT
SWAB CULTURE AMIES ANAERIB BLU (MISCELLANEOUS) IMPLANT
SYR 10ML LL (SYRINGE) IMPLANT
SYR BULB IRRIG 60ML STRL (SYRINGE) IMPLANT
SYR CONTROL 10ML LL (SYRINGE) ×2 IMPLANT
TOWEL OR 17X26 4PK STRL BLUE (TOWEL DISPOSABLE) ×2 IMPLANT
TRAP FLUID SMOKE EVACUATOR (MISCELLANEOUS) ×4 IMPLANT
TUBING CONNECTING 10 (TUBING) IMPLANT
WATER STERILE IRR 500ML POUR (IV SOLUTION) ×4 IMPLANT

## 2023-09-28 NOTE — Anesthesia Postprocedure Evaluation (Signed)
 Anesthesia Post Note  Patient: Amy Pennington  Procedure(s) Performed: Exam under anesthesia, pelvic with biopsies INCISION AND DRAINAGE ABSCESS (Left)  Patient location during evaluation: PACU Anesthesia Type: General Level of consciousness: awake and alert Pain management: pain level controlled Vital Signs Assessment: post-procedure vital signs reviewed and stable Respiratory status: spontaneous breathing, nonlabored ventilation, respiratory function stable and patient connected to nasal cannula oxygen  Cardiovascular status: blood pressure returned to baseline and stable Postop Assessment: no apparent nausea or vomiting Anesthetic complications: no   No notable events documented.   Last Vitals:  Vitals:   09/28/23 1730 09/28/23 1745  BP: (!) 186/101 (!) 171/93  Pulse: 89 83  Resp: 17 15  Temp:    SpO2:  95%    Last Pain:  Vitals:   09/28/23 1745  TempSrc:   PainSc: Asleep                 Prentice Murphy

## 2023-09-28 NOTE — Transfer of Care (Signed)
 Immediate Anesthesia Transfer of Care Note  Patient: Amy Pennington  Procedure(s) Performed: Exam under anesthesia, pelvic INCISION AND DRAINAGE ABSCESS (Left)  Patient Location: PACU  Anesthesia Type:General  Level of Consciousness: awake, alert , and oriented  Airway & Oxygen  Therapy: Patient Spontanous Breathing and Patient connected to face mask oxygen   Post-op Assessment: Report given to RN and Post -op Vital signs reviewed and stable  Post vital signs: stable  Last Vitals:  Vitals Value Taken Time  BP 170/120 09/28/23 1631  Temp 97   Pulse 102 09/28/23 1635  Resp 14 09/28/23 1635  SpO2 98 % 09/28/23 1635  Vitals shown include unfiled device data.  Last Pain:  Vitals:   09/28/23 1455  TempSrc:   PainSc: 8          Complications: No notable events documented.

## 2023-09-28 NOTE — ED Notes (Signed)
 Pt at CT

## 2023-09-28 NOTE — H&P (Signed)
 GYN H&P Name: Amy Pennington MRN: [id number] Date of Service: 09/28/2023   CC: vulvar abscess   HPI: Amy Pennington is a 43 y.o. G1P0010 with a hx of obesity, schizoaffective disorder, prediabetes, tobacco use, substance use, and asthma who presented to the ED today for breast discharge and we are consulted due to concern for a vulvar abscess.   Patient was seen in the ED on 12/28 for a breast abscess which she was aspirated at that time. She received 2 g of cefazolin  and was discharged on Bactrim .    Today she presented reported left nipple drainage and pain. Vitals notable for hypertension and initially tachycardic to 106. CBC wnl (WBC count 9.9), BMP wnl. Lactic acid 1.0. UA large LE, neg nitrite, rare bacteria, >50 WBC. UPT negative. Bcx no growth to date. COVID/flu/RSV negative.   Patient has been evaluated by general surgery who noted a persistent left breast abscess. She was started on IV Zosyn .   Patient reports noticed slight irritation of vulva on 12/28 and then it has gotten bigger since then. Reports it started draining two days ago.   Reports temperature of 100 two days ago. Endorses hot flashes and chills. Denies nausea, vomiting, cough, abdominal pain. Endorses sore throat.   Reports smoking 2 cigarettes/ day. Last used cocaine and marijuana a few days ago.     ROS: All other systems reviewed and negative   GYN Hx: - LMP: 09/21/23 - Menses: 7d bleeding every month - Pap hx: possible hx of abnormal pap but she is not sure, last pap unknown, denies hx of excisional procedures - STI hx: Denies hx of STIs - Sexual preference: sexually active with 1 female partner - Contraceptive method: none - Abdominal surgeries: none - GYN procedures: none   OB Hx:  OB History  Gravida Para Term Preterm AB Living  1    1   SAB IAB Ectopic Multiple Live Births  1        # Outcome Date GA Lbr Len/2nd Weight Sex Type Anes PTL Lv  1 SAB      SAB        PMH:  Past Medical History:   Diagnosis Date   Asthma    Bipolar 1 disorder (HCC)    Depression    GERD (gastroesophageal reflux disease)    History of kidney stones    Migraines    Schizo affective schizophrenia (HCC)      PSH:  Past Surgical History:  Procedure Laterality Date   CYSTOSCOPY W/ RETROGRADES  08/22/2018   Procedure: CYSTOSCOPY WITH RETROGRADE PYELOGRAM;  Surgeon: Twylla Glendia BROCKS, MD;  Location: [facility];  Service: Urology;;   CYSTOSCOPY W/ URETERAL STENT REMOVAL Right 08/22/2018   Procedure: CYSTOSCOPY WITH STENT REMOVAL;  Surgeon: Twylla Glendia BROCKS, MD;  Location: [facility];  Service: Urology;  Laterality: Right;   CYSTOSCOPY WITH HOLMIUM LASER LITHOTRIPSY Right 08/22/2018   Procedure: CYSTOSCOPY WITH HOLMIUM LASER LITHOTRIPSY;  Surgeon: Twylla Glendia BROCKS, MD;  Location: [facility];  Service: Urology;  Laterality: Right;   CYSTOSCOPY WITH STENT PLACEMENT Right 10/28/2017   Procedure: CYSTOSCOPY WITH right ureteral STENT PLACEMENT;  Surgeon: Twylla Glendia BROCKS, MD;  Location: [facility];  Service: Urology;  Laterality: Right;   CYSTOSCOPY WITH URETEROSCOPY Right 08/22/2018   Procedure: CYSTOSCOPY WITH URETEROSCOPY;  Surgeon: Twylla Glendia BROCKS, MD;  Location: [facility];  Service: Urology;  Laterality: Right;   none       Meds:  Current Outpatient Medications  Medication  Instructions   albuterol  (PROVENTIL ) 2.5 mg, Nebulization, Every 4 hours PRN   albuterol  (VENTOLIN  HFA) 108 (90 Base) MCG/ACT inhaler 2 puffs, Inhalation, Every 6 hours PRN   ferrous sulfate 325 mg, Oral, Daily with breakfast   sulfamethoxazole -trimethoprim  (BACTRIM  DS) 800-160 MG tablet 1 tablet, Oral, 2 times daily    Allergies:  No Known Allergies   FH:  Family History  Adopted: Yes  Problem Relation Age of Onset   Asthma Mother    Depression Mother    Diabetes Brother      SH:  Social History   Socioeconomic History   Marital status: Legally Separated    Spouse name: Not on file   Number of children: Not on file   Years  of education: Not on file   Highest education level: Not on file  Occupational History   Occupation: none  Tobacco Use   Smoking status: Every Day    Current packs/day: 0.25    Average packs/day: 0.3 packs/day for 15.0 years (3.8 ttl pk-yrs)    Types: Cigarettes   Smokeless tobacco: Former    Types: Snuff    Quit date: 2005   Tobacco comments:    pt states that she is thinking about alternatives  Vaping Use   Vaping status: Former  Substance and Sexual Activity   Alcohol use: Yes    Comment: occas   Drug use: Yes    Frequency: 2.0 times per week    Types: Cocaine, Marijuana    Comment: Cocaine and Marijuana once or twice a week   Sexual activity: Yes  Other Topics Concern   Not on file  Social History Narrative   Not on file   Social Drivers of Health   Financial Resource Strain: Not on file  Food Insecurity: Not on file  Transportation Needs: Not on file  Physical Activity: Not on file  Stress: Not on file  Social Connections: Not on file  Intimate Partner Violence: Not on file     Physical Exam: Vitals:   09/28/23 0618 09/28/23 1113  BP:  102/65  Pulse: 100 81  Resp:  18  Temp:  98.2 F (36.8 C)  SpO2: 100% 100%   Body mass index is 48.54 kg/m.   General: Alert, NAD HEENT: Mucus membranes moist.  CV: Regular rate Resp: No increased work of breathing Abd: BS present, soft, nontender to palpation, no rebound or guarding Pelvic: (chaperone- Brandy Light present) External female genitalia with 4 x 2 cm indurated and edematous left labia minora extending up near clitoris. Erythema and tissue breakdown with purulent drainage noted. Clitoral piercing in place. Unable to fully evaluate area due to patient discomfort.  Purulent foul smelling gray/grey discharge in the vaginal vault and over external genitalia and perineum.  Unable to visualize cervix due to patient discomfort Skin: No rashes.  Ext: No peripheral edema. Psych: Appropriate affect.     Labs:  Recent Labs    09/28/23 0210  WBC 9.9  HGB 12.5  HCT 38.2  PLT 325   Recent Labs    09/28/23 0210  NA 138  K 4.0  CL 104  CO2 22  CREATININE 0.92  BUN 13  CALCIUM 8.9       Assessment/Plan: [name] is a 43 y.o. G1P0010 with a hx of obesity, schizoaffective disorder, prediabetes, tobacco use, substance use, and asthma who presented to the ED today for breast discharge and we are consulted due to concern for a vulvar abscess.   #  Vulvar abscess #Hx prediabetes- last A1c 6.0 on 09/10/16 - Afebrile with normal heart rate - WBC count 9.9 - BCx NGTD - STI testing: HIV neg on 09/24/23. Remainder ordered (HCV Ab, HbsAg, RPR, GC/CT, wet prep) - A1c ordered - Exam in ED limited by lack of gyn bed with stir-ups and patient discomfort. Vulvar abscess noted but unable to determine extent.  - CT A/P with IV contrast ordered to better evaluate abscess - Plan for exam under anesthesia and vulvar incision and drainage and other indicated procedures. Called OR to post. Pre-op orders placed.  - Coordinated with general surgery who plans to perform breast I&D in OR - Discussed and consented the patient for a EUA, vulvar I&D, and other indicated procedures. Risks such as infection, bleeding, organ damage, anesthesia complications, and need for possible blood products were discussed; patient will accept products in case of an emergency. We discussed the risks of a blood transfusion, such as a 1 in 1.2-1.4 million chance of contracting HIV, Hep C.  - Dispo and antibiotics to be determined based on OR findings.   #Multiple infections - Breast abscess and vulvar abscess. No evidence of sepsis.  - Concern for underlying condition given presence of breast abscess and vulvar abscess - HIV negative recently - A1c ordered to evaluate for diabetes  #Substance use - Reports last used cocaine a few days ago - UDS ordered    Electronically signed: Beverli LULLA Dinsmore, MD, 09/28/2023  11:49 AM

## 2023-09-28 NOTE — Op Note (Signed)
 Preoperative diagnosis: left breast abscess Postoperative diagnosis: same  Procedure: Incision and drainage of left breast abscess  Anesthesia: GETA  Surgeon: TYE  Wound Classification: Contaminated  Indications: Patient is a 43 y.o. female  presented with above.  See H&P for further details.  Specimen: none  Complications: None  Estimated Blood Loss: 5mL  Findings:  1. Left breast abscess involving nipple 2. purulent secretions drained and cultured 3. Adequate hemostasis.   Description of procedure: Joint case with GYN, EUA case. The patient was placed in the supine position and  anesthesia was induced. The area was prepped and draped in the usual sterile fashion. A timeout was completed verifying correct patient, procedure, site, positioning, and implant(s) and/or special equipment prior to beginning this procedure.  Plain Local infused over planned incision site.  Inicision made and purulent secretions was drained. Cultures taken.  With a hemostat blunt dissection of septas performed to drain the abscess completely. Wound size approx 5mm x 5mm x 2cm deep. The wound then irrigated, hemostasis confirmed and then packed with an plain packing, dressed with 4x4 gauze and secured with medipore tape.   The patient tolerated the procedure well and was taken to the postanesthesia care unit in satisfactory condition.

## 2023-09-28 NOTE — ED Provider Notes (Signed)
 [facility] Provider Note    Event Date/Time   First MD Initiated Contact with Patient 09/28/23 [phone]     (approximate)   History   Breast Discharge and Vaginal Discharge   HPI  Amy Pennington is a 43 y.o. female recent drainage from left breast with incision and drainage now on Bactrim  for about 2 days  Patient has noted ongoing amount of drainage around the left nipple, and also the area under the left nipple feels very hard or balled up and leathery.  No fever but has been feeling some chills  Is also noticed that she feels like she has a slight sore throat though no difficulty swallowing.  Has also noticed that her left lower foot genital region is swollen and sore and hurts to urinate  Denies pregnancy.  Reports no drug allergies.  Has been taking Bactrim  as prescribed     Physical Exam   Triage Vital Signs: ED Triage Vitals  Encounter Vitals Group     BP 09/28/23 0201 (!) 124/93     Systolic BP Percentile --      Diastolic BP Percentile --      Pulse Rate 09/28/23 0201 (!) 106     Resp 09/28/23 0201 20     Temp 09/28/23 0201 99.5 F (37.5 C)     Temp Source 09/28/23 0201 Oral     SpO2 09/28/23 0201 97 %     Weight 09/28/23 0202 274 lb 0.5 oz (124.3 kg)     Height 09/28/23 0202 5' 3 (1.6 m)     Head Circumference --      Peak Flow --      Pain Score 09/28/23 0201 7     Pain Loc --      Pain Education --      Exclude from Growth Chart --     Most recent vital signs: Vitals:   09/28/23 0618 09/28/23 1113  BP:  102/65  Pulse: 100 81  Resp:  18  Temp:  98.2 F (36.8 C)  SpO2: 100% 100%    Exam escorted by nurse Waddell throughout General: Awake, no distress.  No trouble swallowing and voice is clear.  Posterior oropharynx does not appear injected or inflamed at this time.  No open sores or blistering CV:  Good peripheral perfusion.  Normal tones Resp:  Normal effort.  Clear bilateral.  Left breast there is a palpable area  of induration fairly inferior to the nipple perhaps around the 1 o'clock position that I would gauge is around 5 cm and ovoid.  No purulence is easily expressible but the patient does report she has had purulent drainage from the nipple over the last day Abd:  No distention.  Soft nontender nondistended Other:  External examination of the perineal area and with normal labia majora, normal right labia minora, the left labia minora is erythematous, tender to touch, and has a slight purulence along its medial margin.  No obvious frank abscess   ED Results / Procedures / Treatments   Labs (all labs ordered are listed, but only abnormal results are displayed) Labs Reviewed  WET PREP, GENITAL - Abnormal; Notable for the following components:      Result Value   Trich, Wet Prep PRESENT (*)    Clue Cells Wet Prep HPF POC PRESENT (*)    WBC, Wet Prep HPF POC >=10 (*)    All other components within normal limits  BASIC METABOLIC PANEL - Abnormal;  Notable for the following components:   Glucose, Bld 100 (*)    All other components within normal limits  CBC WITH DIFFERENTIAL/PLATELET - Abnormal; Notable for the following components:   RDW 15.6 (*)    All other components within normal limits  URINALYSIS, ROUTINE W REFLEX MICROSCOPIC - Abnormal; Notable for the following components:   Color, Urine YELLOW (*)    APPearance CLOUDY (*)    Protein, ur 30 (*)    Leukocytes,Ua LARGE (*)    Bacteria, UA RARE (*)    All other components within normal limits  CULTURE, BLOOD (ROUTINE X 2)  RESP PANEL BY RT-PCR (RSV, FLU A&B, COVID)  [id number]  CULTURE, BLOOD (ROUTINE X 2)  CHLAMYDIA/NGC RT PCR ([facility] ONLY)            LACTIC ACID, PLASMA  PREGNANCY, URINE  HEMOGLOBIN A1C  RPR  HEPATITIS C ANTIBODY  HEPATITIS B SURFACE ANTIGEN  URINE DRUG SCREEN, QUALITATIVE ([facility] ONLY)  HIV ANTIBODY (ROUTINE TESTING W REFLEX)  CBG MONITORING, ED  TYPE AND SCREEN      EKG     RADIOLOGY     PROCEDURES:  Critical Care performed: No  Procedures   MEDICATIONS ORDERED IN ED: Medications  piperacillin -tazobactam (ZOSYN ) IVPB 3.375 g (3.375 g Intravenous New Bag/Given 09/28/23 1142)  ketorolac  (TORADOL ) 30 MG/ML injection 30 mg (30 mg Intramuscular Given 09/28/23 0749)  sodium chloride  0.9 % bolus 1,000 mL (1,000 mLs Intravenous New Bag/Given 09/28/23 1219)     IMPRESSION / MDM / ASSESSMENT AND PLAN / ED COURSE  I reviewed the triage vital signs and the nursing notes.                              Differential diagnosis includes, but is not limited to, ongoing left breast abscess, mild viral infection, pharyngitis though clinically tonsils are not inflamed or with purulence, she is breathing comfortably and has no evidence of that suggest peritonsillar abscess, also considered would be vaginitis, no clear evidence of vaginal abscess at this time.  She does not have any fever her white count is normal lactate is normal, no clear evidence suggest sepsis.  She does however continue on Bactrim  and his symptoms of ongoing purulence from the left breast area area of induration.  Ultrasound obtained   Patient's presentation is most consistent with acute complicated illness / injury requiring diagnostic workup.   Patient evaluated by Dr. Lovetta will be, given the complexity of her exam obesity room and is planning to take the patient to the operating room for further evaluation and we will coordinate disposition planning from there including ongoing consultation also being provided by general surgery.  Dr. Lovetta and Dr. Skippy aware         FINAL CLINICAL IMPRESSION(S) / ED DIAGNOSES   Final diagnoses:  Vaginal pain  Breast abscess  Urinary tract infection, acute     Rx / DC Orders   ED Discharge Orders     None        Note:  This document was prepared using Dragon voice recognition software and may include unintentional  dictation errors.   Dicky Anes, MD 09/28/23 1300

## 2023-09-28 NOTE — Progress Notes (Signed)
 Subjective:   CC: Left breast abscess and labial abscess  HPI:  Amy Pennington is a 43 y.o. female who was consulted by [facility] for evaluation of above.  AspirationS/p a few days ago and currently on Bactrim .  Patient states drainage has continued from the area and now is also complaining of increasing drainage and pain from her labia.   Past Medical History:  has a past medical history of Asthma, Bipolar 1 disorder (HCC), Depression, GERD (gastroesophageal reflux disease), History of kidney stones, Migraines, and Schizo affective schizophrenia (HCC).  Past Surgical History:  has a past surgical history that includes none; Cystoscopy with stent placement (Right, 10/28/2017); Cystoscopy w/ ureteral stent removal (Right, 08/22/2018); Cystoscopy with ureteroscopy (Right, 08/22/2018); Cystoscopy with holmium laser lithotripsy (Right, 08/22/2018); and Cystoscopy w/ retrogrades (08/22/2018).  Family History: family history includes Asthma in her mother; Depression in her mother; Diabetes in her brother. She was adopted.  Social History:  reports that she has been smoking cigarettes. She has a 3.8 pack-year smoking history. She quit smokeless tobacco use about 20 years ago.  Her smokeless tobacco use included snuff. She reports current alcohol use. She reports current drug use. Frequency: 2.00 times per week. Drugs: Cocaine and Marijuana.  Current Medications:  Prior to Admission medications   Medication Sig Start Date End Date Taking? Authorizing Provider  albuterol  (PROVENTIL ) (2.5 MG/3ML) 0.083% nebulizer solution Take 3 mLs (2.5 mg total) by nebulization every 4 (four) hours as needed for wheezing or shortness of breath. 09/30/22 09/30/23  Charlene Debby BROCKS, PA-C  albuterol  (VENTOLIN  HFA) 108 (90 Base) MCG/ACT inhaler Inhale 2 puffs into the lungs every 6 (six) hours as needed for wheezing or shortness of breath. 09/30/22   Charlene Debby BROCKS, PA-C  cyclobenzaprine  (FLEXERIL ) 10 MG tablet Take 1 tablet (10 mg  total) by mouth 3 (three) times daily as needed for muscle spasms. Patient not taking: Reported on 09/24/2023 12/03/18   Jacolyn Pae, MD  diazepam  (VALIUM ) 2 MG tablet Take 1 tablet (2 mg total) by mouth every 8 (eight) hours as needed for muscle spasms. Patient not taking: Reported on 09/24/2023 03/17/21   Sung, Jade J, MD  dicyclomine  (BENTYL ) 20 MG tablet Take 1 tablet (20 mg total) by mouth 4 (four) times daily -  before meals and at bedtime. Patient not taking: Reported on 09/24/2023 01/18/22   Claudene Rover, MD  ferrous sulfate 325 (65 FE) MG tablet Take 325 mg by mouth daily with breakfast.    [provider]  ibuprofen  (ADVIL ) 800 MG tablet Take 1 tablet (800 mg total) by mouth every 8 (eight) hours as needed for moderate pain. Patient not taking: Reported on 09/24/2023 03/17/21   Sung, Jade J, MD  ketorolac  (TORADOL ) 10 MG tablet Take 1 tablet (10 mg total) by mouth every 6 (six) hours as needed. Patient not taking: Reported on 09/24/2023 03/26/21   Triplett, Cari B, FNP  Multiple Vitamins-Iron (MULTIVITAMIN/IRON PO) Take 1 tablet by mouth daily. Patient not taking: Reported on 09/24/2023    [provider]  Multiple Vitamins-Minerals (HAIR SKIN NAILS PO) Take 2 tablets by mouth daily. Patient not taking: Reported on 09/24/2023    [provider]  ondansetron  (ZOFRAN -ODT) 4 MG disintegrating tablet Take 1 tablet (4 mg total) by mouth every 8 (eight) hours as needed. Patient not taking: Reported on 09/24/2023 01/18/22   Claudene Rover, MD  oxybutynin  (DITROPAN ) 5 MG tablet 1 tab tid prn frequency,urgency, bladder spasm Patient not taking: Reported on 09/24/2023 08/23/18  Stoioff, Glendia BROCKS, MD  oxyCODONE -acetaminophen  (PERCOCET/ROXICET) 5-325 MG tablet Take 1 tablet by mouth every 4 (four) hours as needed for severe pain. Patient not taking: Reported on 09/24/2023 03/17/21   Sung, Jade J, MD  predniSONE  (DELTASONE ) 10 MG tablet Take 1 tablet (10 mg total) by mouth  daily. 6,5,4,3,2,1 six day taper Patient not taking: Reported on 09/24/2023 09/30/22   Charlene Debby BROCKS, PA-C  Prenatal Vit-Fe Fumarate-FA (PRENATAL MULTIVITAMIN) TABS tablet Take 1 tablet by mouth daily at 12 noon. Patient not taking: Reported on 09/24/2023    [provider]  sulfamethoxazole -trimethoprim  (BACTRIM  DS) 800-160 MG tablet Take 1 tablet by mouth 2 (two) times daily for 7 days. 09/24/23 10/01/23  Marinda Jayson KIDD, MD  tiZANidine  (ZANAFLEX ) 4 MG tablet Take 1 tablet (4 mg total) by mouth 3 (three) times daily. Patient not taking: Reported on 09/24/2023 03/26/21   Herlinda Belton B, FNP    Allergies:  No Known Allergies  ROS:  General: Denies weight loss, weight gain, fatigue, fevers, chills, and night sweats. Eyes: Denies blurry vision, double vision, eye pain, itchy eyes, and tearing. Ears: Denies hearing loss, earache, and ringing in ears. Nose: Denies sinus pain, congestion, infections, runny nose, and nosebleeds. Mouth/throat: Denies hoarseness, sore throat, bleeding gums, and difficulty swallowing. Heart: Denies chest pain, palpitations, racing heart, irregular heartbeat, leg pain or swelling, and decreased activity tolerance. Respiratory: Denies breathing difficulty, shortness of breath, wheezing, cough, and sputum. GI: Denies change in appetite, heartburn, nausea, vomiting, constipation, diarrhea, and blood in stool. GU: Denies difficulty urinating, pain with urinating, urgency, frequency, blood in urine. Musculoskeletal: Denies joint stiffness, pain, swelling, muscle weakness. Skin: Denies rash, itching, mass, tumors, sores, and boils Neurologic: Denies headache, fainting, dizziness, seizures, numbness, and tingling. Psychiatric: Denies depression, anxiety, difficulty sleeping, and memory loss. Endocrine: Denies heat or cold intolerance, and increased thirst or urination. Blood/lymph: Denies easy bruising, easy bruising, and swollen glands     Objective:      BP (!) 143/87   Pulse 100   Temp 97.9 F (36.6 C) (Oral)   Resp 19   Ht 5' 3 (1.6 m)   Wt 124.3 kg   SpO2 100%   BMI 48.54 kg/m   Constitutional :  alert, cooperative, appears stated age, and mild distress  Lymphatics/Throat:  no asymmetry, masses, or scars  Respiratory:  clear to auscultation bilaterally  Cardiovascular:  regular rate and rhythm  Gastrointestinal: soft, non-tender; bowel sounds normal; no masses,  no organomegaly.  Musculoskeletal: Steady gait and movement  Skin: Chaperone present for exam.  Left breast area with continued cellulitis and skin thickening along with scant purulent drainage consistent with persistent abscess.  Very limited vaginal exam noted copious amounts of green purulent discharge pouring out from the area of skin thickening near the urethra and clitoral piercing.  Psychiatric: Normal affect, non-agitated, not confused       LABS:     Latest Ref Rng & Units 09/28/2023    2:10 AM 09/23/2023   11:39 PM 01/18/2022    9:23 AM  CMP  Glucose 70 - 99 mg/dL 899  875  895   BUN 6 - 20 mg/dL 13  11  13    Creatinine 0.44 - 1.00 mg/dL 9.07  9.34  9.31   Sodium 135 - 145 mmol/L 138  136  135   Potassium 3.5 - 5.1 mmol/L 4.0  3.7  4.2   Chloride 98 - 111 mmol/L 104  104  107   CO2 22 -  32 mmol/L 22  27  21    Calcium 8.9 - 10.3 mg/dL 8.9  8.4  8.8   Total Protein 6.5 - 8.1 g/dL   7.3   Total Bilirubin 0.3 - 1.2 mg/dL   0.3   Alkaline Phos 38 - 126 U/L   41   AST 15 - 41 U/L   16   ALT 0 - 44 U/L   15       Latest Ref Rng & Units 09/28/2023    2:10 AM 09/23/2023   11:39 PM 01/18/2022    9:23 AM  CBC  WBC 4.0 - 10.5 K/uL 9.9  8.1  8.3   Hemoglobin 12.0 - 15.0 g/dL 87.4  88.1  86.4   Hematocrit 36.0 - 46.0 % 38.2  37.8  44.9   Platelets 150 - 400 K/uL 325  297  412     RADS: Pending official ultrasound read of left breast area  Assessment:    Persistent left breast abscess Likely labial abscess   Plan:     Patient is status post  attempted aspiration of the left breast abscess and is currently on Bactrim .  Previous cultures grew PEPTOSTREPTOCOCCUS ASACCHAROLYTICUS   Will proceed with formal incision and drainage for more definitive source control regarding the left breast abscess.  Recommend starting antibiotics after discussion with pharmacy on best antibiotic choice for the atypical bacteria.  Regarding her labial abscess, recommend more formal evaluation by OB/GYN and further recommendations per their team.  Patient is more symptomatic from the labial abscess and the degree of purulent output and area involved is more prominent than the small breast abscess.  Recommend OB/GYN team be primary.  Case discussed with ED provider.  labs/images/medications/previous chart entries reviewed personally and relevant changes/updates noted above.

## 2023-09-28 NOTE — Plan of Care (Addendum)
 Update Due to extreme vaginal discomfort, patient is avoiding urinating leading to retention. Cannot rule out SJS given vaginal and oral mucosal involvement in the setting of recent Bactrim  use. Called [facility] transfer center to inquire about transfer to tertiary care center that treats SJS but they stated they do not accept SJS transfers unless biopsy proven and no other indication for transfer. Hospitalists here are uncomfortable admitting due to vaginal involvement.     Exam HEENT: slight erosion on roof of mouth Skin: No other lesions seen  Assessment & Plan: DAMARA KLUNDER is a 43 y.o. G1P0010 with a hx of obesity, schizoaffective disorder, prediabetes, tobacco use, substance use, and asthma who is admitted for desquamative vaginitis and urinary retention. Scant oral mucosal lesions noted.   #Desquamative vaginitis #Trichomoniasis - Admit to hospital for monitoring, foley catheter, and pain control - Regular diet - Vitals q4h - Continuous pulse ox - Strict Is &Os - Exam in OR with erosive desquamative vaginitis, thin copious white/grey discharge, no discrete lesions, normal appearing cervix - Given recent Bactrim , concern for possible SJS. Intra-op vaginal mucosal biopsies taken. Will discontinue Bactrim  and admit for observation and pain control. Foley catheter inserted due to extreme vaginal pain causing urinary retention.  - Afebrile with normal heart rate - WBC count 9.9 - BCx NGTD - STI testing: HIV neg on 09/24/23. HCV Ab and GC/CT neg. HbsAg and RPR in process. + Trich. - PO metronidazole  500 mg BID x 7 days for trichomoniasis - PO diflucan  150 mg x 1  - PO tylenol  and ibuprofen  scheduled q8h. PRN oxycodone .  - Hydrocortisone  1% 1 applicator to vagina TID - Estrace 1 applicator at bedtime to prevent vaginal adhesions - Due to concern for possible SJS, CXR ordered in addition to LFTs, CRP, and ESR  #Hx prediabetes- last A1c 6.0 on 09/10/16 - A1c ordered  #Left breast  abscess - S/p left breast I&D in OR with general surgery - PO Augmentin  x 7d per general surgery  #HTN - Suspect chronic hypertension based on elevated BPs here  #Substance use - UDS + cocaine  Beverli LULLA Dinsmore, MD 09/28/2023 5:46 PM

## 2023-09-28 NOTE — Progress Notes (Signed)
 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 Given the severity of the condition and likelyhood of deteriorating to life threatening condition. I recommended that for patient's best interest, she should be transferred to [facility] center ASAP, so that multidisciplinary approach involving dermatology and OB/GYN can manage her care. Ob attending agreed with the plan and willing to call [facility] transfer center.

## 2023-09-28 NOTE — Op Note (Addendum)
 OPERATIVE NOTE Name: Amy Pennington MRN: [id number] Date: 09/28/2023  PRE-OP DIAGNOSIS: Left vulvar abscess POST-OP DIAGNOSIS: Desquamative vaginitis PROCEDURE: Exam under anesthesia, vulvar biopsies, vaginal biopsy  SURGEON: Beverli Dinsmore, MD  IVF: 1000cc EBL: 5cc  Specimens: bilateral vulvar biopsies, vaginal biopsy x2, pap smear  Operative findings: Edematous left labia minora (4x2 cm), no induration, fluctuance, or drainage. Thin white, grey discharge throughout the vagina. Normal appearing nulliparous cervix. Erosive desquamation of entire vaginal mucosal with bleeding bases. Mucosal biopsies obtained at multiple locations- left labia minora, right labia minora, vaginal introitus, and loose area of vaginal tissue.  PROCEDURE: Patient consented. General anesthesia was induced. She was prepped and draped in the usual fashion in the dorsal lithotomy position. A timeout protocol was performed prior to initiating the procedure. She received 3g of cefazolin  for prophylaxis. The area was prepared and draped in the usual, sterile manner. A speculum was inserted and above findings noted. The speculum was then removed. Attention turned to the edematous left vulva. The left labia minora was anesthetized with 5cc 1% lidocaine  with epinephrine . The remaining 5cc of lidocaine  was inserted into the vagina. A linear 1cm incision along the local skin lines on the left vulva was made which showed healthy underlying tissue. Due to extensive vaginal desquamation, multiple 4mm punch biopsies were obtained. The biopsy of the left labia continued to ooze so a suture of 3-0 Vicryl was placed. Silver  nitrate was applied to the remaining biopsy sites and hemostasis noted. Speculum was re-inserted and a pap smear was collected. The speculum was removed. Patient tolerated the procedure well.  Signed by Beverli LULLA Dinsmore, MD at 2:46 PM, 09/28/23

## 2023-09-28 NOTE — Discharge Instructions (Addendum)
 Change overlying gauze dressing daily at left breast I&D site.  Okay to shower

## 2023-09-28 NOTE — Progress Notes (Signed)
 ED Pharmacy Antibiotic Sign Off An antibiotic consult was received from an ED provider for Zosyn  per pharmacy dosing for cellulitis. A chart review was completed to assess appropriateness.   The following one time order(s) were placed:  Zosyn  3.375 g IV x1  Further antibiotic and/or antibiotic pharmacy consults should be ordered by the admitting provider if indicated.   Thank you for allowing pharmacy to be a part of this patient's care.   Damien Napoleon, [facility]  Clinical Pharmacist 09/28/23 11:14 AM

## 2023-09-28 NOTE — Anesthesia Procedure Notes (Signed)
 Procedure Name: Intubation Date/Time: 09/28/2023 3:35 PM  Performed by: Gillermo Spruce I, CRNAPre-anesthesia Checklist: Patient identified, Emergency Drugs available, Suction available, Patient being monitored and Timeout performed Patient Re-evaluated:Patient Re-evaluated prior to induction Oxygen  Delivery Method: Circle system utilized Preoxygenation: Pre-oxygenation with 100% oxygen  Induction Type: IV induction Ventilation: Mask ventilation without difficulty Laryngoscope Size: McGrath and 3 Grade View: Grade I Tube type: Oral Tube size: 7.0 mm Number of attempts: 1 Airway Equipment and Method: Stylet

## 2023-09-28 NOTE — Anesthesia Preprocedure Evaluation (Signed)
 Anesthesia Evaluation  Patient identified by MRN, date of birth, ID band Patient awake    Reviewed: Allergy & Precautions, H&P , NPO status , Patient's Chart, lab work & pertinent test results, reviewed documented beta blocker date and time   History of Anesthesia Complications Negative for: history of anesthetic complications  Airway Mallampati: II  TM Distance: >3 FB Neck ROM: full    Dental  (+) Dental Advidsory Given, Poor Dentition, Missing   Pulmonary neg shortness of breath, asthma , neg sleep apnea, neg recent URI, Current Smoker and Patient abstained from smoking.   Pulmonary exam normal breath sounds clear to auscultation       Cardiovascular Exercise Tolerance: Good negative cardio ROS Normal cardiovascular exam Rhythm:regular Rate:Normal     Neuro/Psych  PSYCHIATRIC DISORDERS  Depression Bipolar Disorder Schizophrenia  negative neurological ROS     GI/Hepatic ,GERD  ,,(+)     substance abuse  cocaine use  Endo/Other  neg diabetes  Class 3 obesity  Renal/GU Renal disease (kidney stones)  negative genitourinary   Musculoskeletal   Abdominal   Peds  Hematology negative hematology ROS (+)   Anesthesia Other Findings Past Medical History: No date: Asthma No date: Bipolar 1 disorder (HCC) No date: Depression No date: GERD (gastroesophageal reflux disease) No date: History of kidney stones No date: Migraines No date: Schizo affective schizophrenia (HCC)   Reproductive/Obstetrics negative OB ROS                             Anesthesia Physical Anesthesia Plan  ASA: 3  Anesthesia Plan: General   Post-op Pain Management:    Induction: Intravenous  PONV Risk Score and Plan: 2 and Ondansetron , Dexamethasone , Treatment may vary due to age or medical condition and Midazolam   Airway Management Planned: Oral ETT  Additional Equipment:   Intra-op Plan:   Post-operative  Plan: Extubation in OR  Informed Consent: I have reviewed the patients History and Physical, chart, labs and discussed the procedure including the risks, benefits and alternatives for the proposed anesthesia with the patient or authorized representative who has indicated his/her understanding and acceptance.     Dental Advisory Given  Plan Discussed with: Anesthesiologist, CRNA and Surgeon  Anesthesia Plan Comments:         Anesthesia Quick Evaluation

## 2023-09-28 NOTE — ED Triage Notes (Signed)
 Patient brought in via [facility] tonight from home with complaints of worsening left breast pain and discharge from the nipple. Patient was seen on the 27th and had surgery drained 3cs off of breast on the 28th. Patient states it is foul smelling despite her being on antibiotics since she got discharged. She also states that she had a cyst on her labia on the 29th that busted open and drained yesterday. Complaints of continued drainage that has foul odor.

## 2023-09-29 ENCOUNTER — Encounter: Payer: Self-pay | Admitting: Obstetrics and Gynecology

## 2023-09-29 DIAGNOSIS — F17210 Nicotine dependence, cigarettes, uncomplicated: Secondary | ICD-10-CM | POA: Diagnosis present

## 2023-09-29 DIAGNOSIS — A5901 Trichomonal vulvovaginitis: Secondary | ICD-10-CM | POA: Diagnosis present

## 2023-09-29 DIAGNOSIS — J9601 Acute respiratory failure with hypoxia: Secondary | ICD-10-CM | POA: Diagnosis present

## 2023-09-29 DIAGNOSIS — Z1152 Encounter for screening for COVID-19: Secondary | ICD-10-CM | POA: Diagnosis not present

## 2023-09-29 DIAGNOSIS — K219 Gastro-esophageal reflux disease without esophagitis: Secondary | ICD-10-CM | POA: Diagnosis present

## 2023-09-29 DIAGNOSIS — M352 Behcet's disease: Secondary | ICD-10-CM | POA: Diagnosis present

## 2023-09-29 DIAGNOSIS — K121 Other forms of stomatitis: Secondary | ICD-10-CM | POA: Diagnosis present

## 2023-09-29 DIAGNOSIS — J45909 Unspecified asthma, uncomplicated: Secondary | ICD-10-CM | POA: Diagnosis present

## 2023-09-29 DIAGNOSIS — K120 Recurrent oral aphthae: Secondary | ICD-10-CM | POA: Diagnosis present

## 2023-09-29 DIAGNOSIS — N766 Ulceration of vulva: Secondary | ICD-10-CM | POA: Diagnosis present

## 2023-09-29 DIAGNOSIS — N762 Acute vulvitis: Secondary | ICD-10-CM | POA: Diagnosis present

## 2023-09-29 DIAGNOSIS — N761 Subacute and chronic vaginitis: Secondary | ICD-10-CM | POA: Diagnosis not present

## 2023-09-29 DIAGNOSIS — K1370 Unspecified lesions of oral mucosa: Secondary | ICD-10-CM | POA: Diagnosis not present

## 2023-09-29 DIAGNOSIS — K5090 Crohn's disease, unspecified, without complications: Secondary | ICD-10-CM | POA: Diagnosis present

## 2023-09-29 DIAGNOSIS — N760 Acute vaginitis: Secondary | ICD-10-CM | POA: Diagnosis present

## 2023-09-29 DIAGNOSIS — F1490 Cocaine use, unspecified, uncomplicated: Secondary | ICD-10-CM | POA: Diagnosis present

## 2023-09-29 DIAGNOSIS — E662 Morbid (severe) obesity with alveolar hypoventilation: Secondary | ICD-10-CM | POA: Diagnosis present

## 2023-09-29 DIAGNOSIS — I10 Essential (primary) hypertension: Secondary | ICD-10-CM | POA: Diagnosis present

## 2023-09-29 DIAGNOSIS — R7303 Prediabetes: Secondary | ICD-10-CM | POA: Diagnosis present

## 2023-09-29 DIAGNOSIS — N611 Abscess of the breast and nipple: Secondary | ICD-10-CM | POA: Diagnosis present

## 2023-09-29 DIAGNOSIS — F259 Schizoaffective disorder, unspecified: Secondary | ICD-10-CM | POA: Diagnosis present

## 2023-09-29 DIAGNOSIS — R339 Retention of urine, unspecified: Secondary | ICD-10-CM | POA: Diagnosis present

## 2023-09-29 DIAGNOSIS — L511 Stevens-Johnson syndrome: Secondary | ICD-10-CM | POA: Diagnosis present

## 2023-09-29 DIAGNOSIS — F32A Depression, unspecified: Secondary | ICD-10-CM | POA: Diagnosis present

## 2023-09-29 DIAGNOSIS — N765 Ulceration of vagina: Secondary | ICD-10-CM | POA: Diagnosis present

## 2023-09-29 DIAGNOSIS — N764 Abscess of vulva: Secondary | ICD-10-CM | POA: Diagnosis present

## 2023-09-29 LAB — COMPREHENSIVE METABOLIC PANEL
ALT: 12 U/L (ref 0–44)
AST: 14 U/L — ABNORMAL LOW (ref 15–41)
Albumin: 3.1 g/dL — ABNORMAL LOW (ref 3.5–5.0)
Alkaline Phosphatase: 45 U/L (ref 38–126)
Anion gap: 8 (ref 5–15)
BUN: 11 mg/dL (ref 6–20)
CO2: 24 mmol/L (ref 22–32)
Calcium: 8.5 mg/dL — ABNORMAL LOW (ref 8.9–10.3)
Chloride: 104 mmol/L (ref 98–111)
Creatinine, Ser: 0.64 mg/dL (ref 0.44–1.00)
GFR, Estimated: >60 mL/min (ref >60)
Glucose, Bld: 154 mg/dL — ABNORMAL HIGH (ref 70–99)
Potassium: 4.5 mmol/L (ref 3.5–5.1)
Sodium: 136 mmol/L (ref 135–145)
Total Bilirubin: 0.6 mg/dL (ref 0.0–1.2)
Total Protein: 6.3 g/dL — ABNORMAL LOW (ref 6.5–8.1)

## 2023-09-29 LAB — HEMOGLOBIN A1C
Hgb A1c MFr Bld: 6 % — ABNORMAL HIGH (ref 4.8–5.6)
Mean Plasma Glucose: 126 mg/dL

## 2023-09-29 LAB — CBC
HCT: 36.8 % (ref 36.0–46.0)
Hemoglobin: 11.7 g/dL — ABNORMAL LOW (ref 12.0–15.0)
MCH: 26.2 pg (ref 26.0–34.0)
MCHC: 31.8 g/dL (ref 30.0–36.0)
MCV: 82.5 fL (ref 80.0–100.0)
Platelets: 372 10*3/uL (ref 150–400)
RBC: 4.46 MIL/uL (ref 3.87–5.11)
RDW: 15.3 % (ref 11.5–15.5)
WBC: 12.7 10*3/uL — ABNORMAL HIGH (ref 4.0–10.5)
nRBC: 0 % (ref 0.0–0.2)

## 2023-09-29 LAB — HIV ANTIBODY (ROUTINE TESTING W REFLEX): HIV Screen 4th Generation wRfx: NONREACTIVE

## 2023-09-29 LAB — RPR: RPR Ser Ql: NONREACTIVE

## 2023-09-29 LAB — C-REACTIVE PROTEIN: CRP: 2.3 mg/dL — ABNORMAL HIGH (ref <1.0)

## 2023-09-29 LAB — HEPATITIS C ANTIBODY: HCV Ab: NONREACTIVE

## 2023-09-29 LAB — HEPATITIS B SURFACE ANTIGEN: Hepatitis B Surface Ag: NONREACTIVE

## 2023-09-29 NOTE — Plan of Care (Signed)
   Problem: Education: Goal: Knowledge of General Education information will improve Description Including pain rating scale, medication(s)/side effects and non-pharmacologic comfort measures Outcome: Progressing   Problem: Clinical Measurements: Goal: Ability to maintain clinical measurements within normal limits will improve Outcome: Progressing   Problem: Activity: Goal: Risk for activity intolerance will decrease Outcome: Progressing

## 2023-09-29 NOTE — Progress Notes (Signed)
 1 Day Post-Op       Procedure(s) with comments: Exam under anesthesia, pelvic with biopsies (N/A) INCISION AND DRAINAGE ABSCESS (Left) - LEFT BREAST  Subjective: Vaginal pain improved, but throat ulcers worsening. Increased pain down her throat, no longer just in the back of her mouth. Using viscous lidocaine  and ice for throat to be able to eat. Is tolerating regular diet.  Foley cath still in place since yesterday.   Objective: Vital signs in last 24 hours: Temp:  [97 F (36.1 C)-98.5 F (36.9 C)] 98.4 F (36.9 C) (01/02 1230) Pulse Rate:  [73-106] 82 (01/02 1230) Resp:  [15-20] 18 (01/02 1230) BP: (128-186)/(86-115) 150/100 (01/02 1230) SpO2:  [92 %-100 %] 98 % (01/02 1230)  Intake/Output  Intake/Output Summary (Last 24 hours) at 09/29/2023 1341 Last data filed at 09/29/2023 0606 Gross per 24 hour  Intake 1372 ml  Output 1205 ml  Net 167 ml    Physical Exam:  General: Alert and oriented. Throat: aphthous ulcers noted on soft palate. Discrete, shallow, round 1cm ulcers with an erythematous rim and yellow exudate noted, R>L GI: Soft, Nondistended. Vulva: Improved left labial swelling, no right labial edema. Smaller ulcerated lesions than described yesterday, but still present. Urine: Clear, Foley in place Extremities: Nontender, no erythema, no edema.  Lab Results: Recent Labs    09/28/23 0210 09/29/23 0537  HGB 12.5 11.7*  HCT 38.2 36.8  WBC 9.9 12.7*  PLT 325 372                 Results for orders placed or performed during the hospital encounter of 09/28/23 (from the past 24 hours)  Blood culture (routine x 2)     Status: None (Preliminary result)   Collection Time: 09/28/23  5:37 PM   Specimen: BLOOD  Result Value Ref Range   Specimen Description BLOOD BLOOD LEFT ARM    Special Requests      BOTTLES DRAWN AEROBIC AND ANAEROBIC Blood Culture adequate volume   Culture      NO GROWTH < 24 HOURS Performed at [facility], [address]  [place], [place] [address]    Report Status PENDING   Sedimentation rate     Status: Abnormal   Collection Time: 09/28/23  5:37 PM  Result Value Ref Range   Sed Rate 30 (H) 0 - 20 mm/hr  CBC     Status: Abnormal   Collection Time: 09/29/23  5:37 AM  Result Value Ref Range   WBC 12.7 (H) 4.0 - 10.5 K/uL   RBC 4.46 3.87 - 5.11 MIL/uL   Hemoglobin 11.7 (L) 12.0 - 15.0 g/dL   HCT 63.1 63.9 - 53.9 %   MCV 82.5 80.0 - 100.0 fL   MCH 26.2 26.0 - 34.0 pg   MCHC 31.8 30.0 - 36.0 g/dL   RDW 84.6 88.4 - 84.4 %   Platelets 372 150 - 400 K/uL   nRBC 0.0 0.0 - 0.2 %  Comprehensive metabolic panel     Status: Abnormal   Collection Time: 09/29/23  5:37 AM  Result Value Ref Range   Sodium 136 135 - 145 mmol/L   Potassium 4.5 3.5 - 5.1 mmol/L   Chloride 104 98 - 111 mmol/L   CO2 24 22 - 32 mmol/L   Glucose, Bld 154 (H) 70 - 99 mg/dL   BUN 11 6 - 20 mg/dL   Creatinine, Ser 9.35 0.44 - 1.00 mg/dL   Calcium 8.5 (L) 8.9 - 10.3 mg/dL  Total Protein 6.3 (L) 6.5 - 8.1 g/dL   Albumin 3.1 (L) 3.5 - 5.0 g/dL   AST 14 (L) 15 - 41 U/L   ALT 12 0 - 44 U/L   Alkaline Phosphatase 45 38 - 126 U/L   Total Bilirubin 0.6 0.0 - 1.2 mg/dL   GFR, Estimated >39 >39 mL/min   Anion gap 8 5 - 15    Assessment/Plan: 1 Day Post-Op       Procedure(s) with comments: Exam under anesthesia, pelvic with biopsies (N/A) INCISION AND DRAINAGE ABSCESS (Left) - LEFT BREAST  Desquamative vaginalitis:  Ddx includes acute aphthous ulcers from EB virus or other infectious cause, drug reaction including SJS, Bechets, Crohns. Pathology contacted today and will have results in approx 2 days - continue hydrocortisone  vaginally TID - continue vaginal estrogen nightly - continue Foley for urinary control, reassess daily need - not using oral pain medication - s/p diflucan  - continue to monitor for worsening sx  Trich and BV:  - metronidazole  500mg  po BID x7 days  Breast abscess: - continue augmentin  per general  surgery  Leukocytosis: - increased WBC today consistent with surgery yesterday. Afebrile. Will repeat tomorrow  O2 desaturation while sleeping, consistent with OSA: - chest Xray wnl - 2L [place] when sleeping - consider sleep study. Respiratory consult placed.  Elevated BG: - A1c pending  HTN - Suspect chronic hypertension based on elevated BPs here   Substance use - UDS + cocaine - no acute sx of withdrawal today   Heather Penton, MD   LOS: 0 days   Heather Penton 09/29/2023, 1:41 PM

## 2023-09-30 ENCOUNTER — Inpatient Hospital Stay: Payer: Medicaid Other

## 2023-09-30 DIAGNOSIS — N761 Subacute and chronic vaginitis: Secondary | ICD-10-CM | POA: Diagnosis not present

## 2023-09-30 DIAGNOSIS — J9601 Acute respiratory failure with hypoxia: Secondary | ICD-10-CM | POA: Diagnosis not present

## 2023-09-30 DIAGNOSIS — N611 Abscess of the breast and nipple: Secondary | ICD-10-CM | POA: Diagnosis not present

## 2023-09-30 LAB — URINE CULTURE

## 2023-09-30 LAB — AEROBIC/ANAEROBIC CULTURE W GRAM STAIN (SURGICAL/DEEP WOUND)

## 2023-09-30 LAB — CBC
HCT: 34.6 % — ABNORMAL LOW (ref 36.0–46.0)
Hemoglobin: 10.7 g/dL — ABNORMAL LOW (ref 12.0–15.0)
MCH: 26.4 pg (ref 26.0–34.0)
MCHC: 30.9 g/dL (ref 30.0–36.0)
MCV: 85.4 fL (ref 80.0–100.0)
Platelets: 347 10*3/uL (ref 150–400)
RBC: 4.05 MIL/uL (ref 3.87–5.11)
RDW: 15.4 % (ref 11.5–15.5)
WBC: 8.5 10*3/uL (ref 4.0–10.5)
nRBC: 0 % (ref 0.0–0.2)

## 2023-09-30 MED ORDER — MAGIC MOUTHWASH
5.0000 mL | Freq: Three times a day (TID) | ORAL | Status: DC
  Administered 2023-09-30 – 2023-10-03 (×8): 5 mL via ORAL
  Filled 2023-09-30 (×10): qty 5

## 2023-09-30 MED ORDER — DEXAMETHASONE SODIUM PHOSPHATE 10 MG/ML IJ SOLN
10.0000 mg | Freq: Once | INTRAMUSCULAR | Status: AC
  Administered 2023-09-30: 10 mg via INTRAVENOUS
  Filled 2023-09-30: qty 1

## 2023-09-30 MED ORDER — IPRATROPIUM-ALBUTEROL 0.5-2.5 (3) MG/3ML IN SOLN
3.0000 mL | Freq: Four times a day (QID) | RESPIRATORY_TRACT | Status: DC | PRN
  Filled 2023-09-30: qty 3

## 2023-09-30 MED ORDER — SODIUM CHLORIDE 0.9 % IV SOLN: INTRAVENOUS | Status: DC

## 2023-09-30 MED ORDER — TRIAMCINOLONE ACETONIDE 0.1 % MT PSTE
PASTE | Freq: Three times a day (TID) | OROMUCOSAL | Status: DC
  Administered 2023-10-02: 1 via OROMUCOSAL
  Filled 2023-09-30 (×3): qty 5

## 2023-09-30 NOTE — Assessment & Plan Note (Signed)
 Patient is currently on Augmentin per surgery.  - Continued management per general surgery

## 2023-09-30 NOTE — Assessment & Plan Note (Addendum)
 Per chart review, patient has been experiencing nocturnal desaturations requiring 2 L of supplemental oxygen  consistent with OSA in the setting of morbid obesity.  Chest imaging negative.  VBG with no hypercarbia, desaturation with ambulation, most consistent with OSA and possibly obesity hypoventilation syndrome.  -Patient need 2 L of oxygen  with ambulation and while sleeping at this time. -Patient need outpatient sleep study for further evaluation and intervention. -Continue with supportive care and supplemental oxygen 

## 2023-09-30 NOTE — Consult Note (Signed)
 Amy Pennington, Amy Pennington [id number] [birth year] Schermerhorn, Debby PARAS, *  Reason for Consult: Oral ulcers  HPI: 43 year old female admitted for breast abscess who has developed mucosal based ulcerations/desquamation in vagina as well as oral mucosa.  These occurred shortly after beginning Bactrim  and it is unclear if these are SJS versus Behcet's disease.  Biopsies from GYN are pending.  Patient reports odynophagia.  She did have some desaturation earlier today and just finished getting a chest CT scan.  She reports her throat started hurting first and then the oral sores developed.  She has never had anything like this occur before.  She reports no prior ulcerations anywhere else.  Prior to the bactrim  she reported no known drug allergies.  Allergies: No Known Allergies  ROS: Review of systems normal other than 12 systems except per HPI.  PMH:  Past Medical History:  Diagnosis Date   Asthma    Bipolar 1 disorder (HCC)    Depression    GERD (gastroesophageal reflux disease)    History of kidney stones    Migraines    Schizo affective schizophrenia (HCC)     FH:  Family History  Adopted: Yes  Problem Relation Age of Onset   Asthma Mother    Depression Mother    Diabetes Brother     SH:  Social History   Socioeconomic History   Marital status: Legally Separated    Spouse name: Not on file   Number of children: Not on file   Years of education: Not on file   Highest education level: Not on file  Occupational History   Occupation: none  Tobacco Use   Smoking status: Every Day    Current packs/day: 0.25    Average packs/day: 0.3 packs/day for 15.0 years (3.8 ttl pk-yrs)    Types: Cigarettes   Smokeless tobacco: Former    Types: Snuff    Quit date: 2005   Tobacco comments:    pt states that she is thinking about alternatives  Vaping Use   Vaping status: Former  Substance and Sexual Activity   Alcohol use: Yes    Comment: occas   Drug use: Yes    Frequency: 2.0 times per  week    Types: Cocaine, Marijuana    Comment: Cocaine and Marijuana once or twice a week   Sexual activity: Yes  Other Topics Concern   Not on file  Social History Narrative   Not on file   Social Drivers of Health   Financial Resource Strain: Not on file  Food Insecurity: Not on file  Transportation Needs: Not on file  Physical Activity: Not on file  Stress: Not on file  Social Connections: Not on file  Intimate Partner Violence: Not on file    PSH:  Past Surgical History:  Procedure Laterality Date   CYSTOSCOPY W/ RETROGRADES  08/22/2018   Procedure: CYSTOSCOPY WITH RETROGRADE PYELOGRAM;  Surgeon: Twylla Glendia BROCKS, MD;  Location: [facility] ORS;  Service: Urology;;   CYSTOSCOPY W/ URETERAL STENT REMOVAL Right 08/22/2018   Procedure: CYSTOSCOPY WITH STENT REMOVAL;  Surgeon: Twylla Glendia BROCKS, MD;  Location: [facility] ORS;  Service: Urology;  Laterality: Right;   CYSTOSCOPY WITH HOLMIUM LASER LITHOTRIPSY Right 08/22/2018   Procedure: CYSTOSCOPY WITH HOLMIUM LASER LITHOTRIPSY;  Surgeon: Twylla Glendia BROCKS, MD;  Location: [facility] ORS;  Service: Urology;  Laterality: Right;   CYSTOSCOPY WITH STENT PLACEMENT Right 10/28/2017   Procedure: CYSTOSCOPY WITH right ureteral STENT PLACEMENT;  Surgeon: Twylla Glendia BROCKS, MD;  Location: [facility]  ORS;  Service: Urology;  Laterality: Right;   CYSTOSCOPY WITH URETEROSCOPY Right 08/22/2018   Procedure: CYSTOSCOPY WITH URETEROSCOPY;  Surgeon: Twylla Glendia BROCKS, MD;  Location: [facility] ORS;  Service: Urology;  Laterality: Right;   INCISION AND DRAINAGE ABSCESS Left 09/28/2023   Procedure: INCISION AND DRAINAGE ABSCESS;  Surgeon: Tye Millet, DO;  Location: [facility] ORS;  Service: General;  Laterality: Left;  LEFT BREAST   none      Physical  Exam:  GEN-  supine in NAD NEURO-  CN 2-12 grossly intact and symmetric. EARS-  clear external ears NOSE-  clear anteriorly OC/OP-  large oral ulceration of right buccal mucosa.  Ulcer on ventral aspect of tongue.  Soft palate ulcerations  bilaterally.  Smaller ulceration on hard palate. NECK-  supple with no LAD RESP- unlabored CARD-  RRR  Procedure:  Trans-nasal flexible laryngoscopy.  Pre-procedure diagnosis:  odynophagia.  Post-procedure diagnosis:  same.  Description of procedure:  After verbal consent was obtained, the patient's right nostril was anaesthetized with topical Gen-nasal and 2% lidocaine .  A flexible fiberoptic scope was next inserted into the patient's right nasal cavity for view of the nasopharynx, pharynx, larynx and hypopharynx.  This demonstrated some small ulcerations of lateral pharyngeal wall but no involvement of her larynx or hypopharynx or post-cricoid region.  Airway is widely patent.   A/P: Ulcerations of oral cavity Behcet's vs SJS  Plan:  Await pathology report.  Recommend Rheumatology if Behcet's.  Discussed with Medicine and OB/GYN and cleared for IV decadron  and will do a single 10mg  dose tonight and see if that improves things.  Will placed on oral steroid rinse/wash as well to help treat topically.  Recommend hydration with prevention of drying out oral mucosa.   Carolee Shanikia Kernodle 09/30/2023 5:59 PM

## 2023-09-30 NOTE — Assessment & Plan Note (Addendum)
 Patient presented with vaginal ulcerations and subsequently developed oral ulcerations concerning for viral infection versus SJS, although the latter is less likely given clinical stability.  Another consideration was Behet disease but patient did not had prior episodes, no ocular or neurologic lesions.  Biopsy was obtained with results pending. -Give her a trial of Decadron  - Management per OB/GYN - Will follow-up pathology results

## 2023-09-30 NOTE — Consult Note (Signed)
 Initial Consultation Note   Patient: Amy Pennington FMW:[id number] DOB: 1981-07-17 PCP: Pcp, No DOA: 09/28/2023 DOS: the patient was seen and examined on 09/30/2023 Primary service: Schermerhorn, Debby PARAS, *  Referring physician: Dr. Lovetta Reason for consult: SOB  Assessment/Plan: Assessment and Plan:  * Desquamative inflammatory vaginitis Patient presented with vaginal ulcerations and subsequently developed oral ulcerations concerning for Behcet syndrome versus SJS, although the latter is less likely given clinical stability.  Biopsy was obtained with results pending.  - Management per OB/GYN - Will follow-up pathology results  Acute hypoxic respiratory failure (HCC) Per chart review, patient has been experiencing nocturnal desaturations requiring 2 L of supplemental oxygen  consistent with OSA in the setting of morbid obesity.  However this morning, she experienced hypoxia with shortness of breath requiring supplemental oxygen  when attempting to stand.  She has a history of asthma however no wheezing on examination.  No cough noted.  Given concern for Behcet syndrome, will obtain imaging to rule out pulmonary involvement. Otherwise, most consistent with OSA and possibly obesity hypoventilation syndrome.   - Continue supplemental oxygen  at bedtime to maintain oxygen  saturation above 88% - CT chest pending - AM VBG ordered - Trial of DuoNebs - Will need outpatient sleep study  Breast abscess of female Patient is currently on Augmentin  per surgery.  - Continued management per general surgery   TRH will continue to follow the patient.  HPI: Amy Pennington is a 43 y.o. female with past medical history of asthma, morbid obesity with BMI of 48, who presents to the hospital for left breast abscess and vaginal ulceration concerning for desquamative vaginitis.  TRH consulted for shortness of breath and hypoxia.  Amy Pennington states that she has been experiencing low oxygen  at bedtime  and oxygen  has helped this, however last night, she did not feel well.  Then this morning, when she was standing up to get something that fell down onto the floor, she felt shortness of breath.  Her oxygen  levels were checked and were low in the 70s.  She was placed on 2 L with rapid recovery.  At this time, she denies any shortness of breath, cough, rhinorrhea, sinus congestion.  She notes that she has a history of asthma.  She endorses continued valvular pain in the setting of ulceration with brown drainage and mouth pain that has limited her ability to tolerate p.o. intake.  She notes that the ulcers in her mouth seems to be spreading downward.  Review of Systems: As mentioned in the history of present illness. All other systems reviewed and are negative.  Past Medical History:  Diagnosis Date   Asthma    Bipolar 1 disorder (HCC)    Depression    GERD (gastroesophageal reflux disease)    History of kidney stones    Migraines    Schizo affective schizophrenia [facility])    Past Surgical History:  Procedure Laterality Date   CYSTOSCOPY W/ RETROGRADES  08/22/2018   Procedure: CYSTOSCOPY WITH RETROGRADE PYELOGRAM;  Surgeon: Twylla Glendia BROCKS, MD;  Location: [facility] ORS;  Service: Urology;;   CYSTOSCOPY W/ URETERAL STENT REMOVAL Right 08/22/2018   Procedure: CYSTOSCOPY WITH STENT REMOVAL;  Surgeon: Twylla Glendia BROCKS, MD;  Location: [facility]S;  Service: Urology;  Laterality: Right;   CYSTOSCOPY WITH HOLMIUM LASER LITHOTRIPSY Right 08/22/2018   Procedure: CYSTOSCOPY WITH HOLMIUM LASER LITHOTRIPSY;  Surgeon: Twylla Glendia BROCKS, MD;  Location: [facility] ORS;  Service: Urology;  Laterality: Right;   CYSTOSCOPY WITH STENT PLACEMENT Right 10/28/2017  Procedure: CYSTOSCOPY WITH right ureteral STENT PLACEMENT;  Surgeon: Twylla Glendia BROCKS, MD;  Location: [facility] ORS;  Service: Urology;  Laterality: Right;   CYSTOSCOPY WITH URETEROSCOPY Right 08/22/2018   Procedure: CYSTOSCOPY WITH URETEROSCOPY;  Surgeon: Twylla Glendia BROCKS, MD;   Location: [facility] ORS;  Service: Urology;  Laterality: Right;   INCISION AND DRAINAGE ABSCESS Left 09/28/2023   Procedure: INCISION AND DRAINAGE ABSCESS;  Surgeon: Tye Millet, DO;  Location: [facility] ORS;  Service: General;  Laterality: Left;  LEFT BREAST   none     Social History:  reports that she has been smoking cigarettes. She has a 3.8 pack-year smoking history. She quit smokeless tobacco use about 20 years ago.  Her smokeless tobacco use included snuff. She reports current alcohol use. She reports current drug use. Frequency: 2.00 times per week. Drugs: Cocaine and Marijuana.  No Known Allergies  Family History  Adopted: Yes  Problem Relation Age of Onset   Asthma Mother    Depression Mother    Diabetes Brother     Prior to Admission medications   Medication Sig Start Date End Date Taking? Authorizing Provider  sulfamethoxazole -trimethoprim  (BACTRIM  DS) 800-160 MG tablet Take 1 tablet by mouth 2 (two) times daily for 7 days. 09/24/23 10/01/23 Yes Marinda Jayson KIDD, MD  albuterol  (PROVENTIL ) (2.5 MG/3ML) 0.083% nebulizer solution Take 3 mLs (2.5 mg total) by nebulization every 4 (four) hours as needed for wheezing or shortness of breath. Patient not taking: Reported on 09/28/2023 09/30/22 09/30/23  Charlene Debby BROCKS, PA-C  albuterol  (VENTOLIN  HFA) 108 [phone] Base) MCG/ACT inhaler Inhale 2 puffs into the lungs every 6 (six) hours as needed for wheezing or shortness of breath. Patient not taking: Reported on 09/28/2023 09/30/22   Charlene Debby BROCKS, PA-C  ferrous sulfate 325 (65 FE) MG tablet Take 325 mg by mouth daily with breakfast. Patient not taking: Reported on 09/28/2023    [provider]    Physical Exam: Vitals:   09/30/23 0900 09/30/23 1000 09/30/23 1100 09/30/23 1125  BP:    111/62  Pulse: 72 63 61 63  Resp:    20  Temp:    98 F (36.7 C)  TempSrc:      SpO2: 100% 99% 98% 100%  Weight:      Height:       Physical Exam Vitals and nursing note reviewed.  Constitutional:       General: She is not in acute distress.    Appearance: She is obese. She is not toxic-appearing.  HENT:     Head: Normocephalic and atraumatic.     Mouth/Throat:     Mouth: Mucous membranes are moist.     Comments: Large apthous ulcers on both the hard palate and right buccal.  No satellite lesions noted.  Notable surrounding erythema Eyes:     Conjunctiva/sclera: Conjunctivae normal.     Pupils: Pupils are equal, round, and reactive to light.  Cardiovascular:     Rate and Rhythm: Normal rate and regular rhythm.     Heart sounds: No murmur heard.    No gallop.  Pulmonary:     Effort: Pulmonary effort is normal. No respiratory distress.     Breath sounds: Normal breath sounds. No wheezing, rhonchi or rales.  Abdominal:     General: Bowel sounds are normal.     Palpations: Abdomen is soft.  Musculoskeletal:     Right lower leg: No edema.     Left lower leg: No edema.  Skin:  General: Skin is warm and dry.     Findings: No bruising, erythema, lesion or rash.  Neurological:     General: No focal deficit present.     Mental Status: She is alert and oriented to person, place, and time. Mental status is at baseline.  Psychiatric:        Mood and Affect: Mood normal.        Behavior: Behavior normal.    Data Reviewed:  CBC with WBC of 8.5, hemoglobin of 10.7, and platelets of 347 CMP obtained yesterday with sodium of 136, potassium 4.5, glucose 154, creatinine 0.64, total protein 6.3, albumin 3.1, AST 14, GFR above 60 ESR elevated at 30 CRP elevated at 2.3 Wet prep with clue cells and trichomonas Hepatitis C antibody negative RPR negative HIV negative COVID-19 PCR negative UDS positive for cocaine A1c elevated 6.0%  CT Chest pending   Results are pending, will review when available.   Family Communication: No family at bedside  Primary team communication: Primary team notified  Thank you very much for involving us  in the care of your patient.  Author: Clayborne Broom, MD 09/30/2023 3:12 PM  For on call review [email or web address].

## 2023-09-30 NOTE — Progress Notes (Signed)
 Subjective:HD#3 desquamative vulvitis and oral ulceration .s/p Left breast abscess I+D  ABX : augmentin  + flagyl   Patient reports vulvar pain slowly improving stated at 35/10---> yesterday 9/10 --> today 8/10 Oral ulceration allowing her only small soft po intake . Small liquid intake    SOB this am with standing . Pulse ox on 2L O2 desat to 70's  Objective: I have reviewed patient's vital signs, intake and output, medications, labs, and microbiology. Results for orders placed or performed during the hospital encounter of 09/28/23 (from the past 24 hours)  CBC     Status: Abnormal   Collection Time: 09/30/23  4:32 AM  Result Value Ref Range   WBC 8.5 4.0 - 10.5 K/uL   RBC 4.05 3.87 - 5.11 MIL/uL   Hemoglobin 10.7 (L) 12.0 - 15.0 g/dL   HCT 65.3 (L) 63.9 - 53.9 %   MCV 85.4 80.0 - 100.0 fL   MCH 26.4 26.0 - 34.0 pg   MCHC 30.9 30.0 - 36.0 g/dL   RDW 84.5 88.4 - 84.4 %   Platelets 347 150 - 400 K/uL   nRBC 0.0 0.0 - 0.2 %    Lungs CTA   CV RRR with murmur Breast : left- dressing removed . Packing in place . Slight erythema + induration  Pharynx: right 2x2 cm shallow ulceration Vulva: inner labia minora + ulceration 3x2 cm   Assessment/Plan: 1.Desquamative vulvitis , oral ulceration - differential dx: Bechet's, Elspeth louder syndrome, or other viral etiology . Mild improvement Awaiting pathology . ENt Dr Milissa to eval oral ulcerations. Continue HC vaginal cream, foley catheter due to painful urination  Start soft diet .   2. Breast abscess s/p I+D . Dr Tye pulled packing -he feels abscess is improving . Cont Augmentin   3. SOB , likely OSA. SOB while awake .hospitalist consult placed and Dr Arnett to see. Overnight sleep study placed   4. Poor nutrition - start IVF 125 cc/ hr NS, soft diet  Record I+O's . Cmp and repeat cbc in am   60 minutes in patient care   LOS: 1 day    Debby JINNY Dinsmore, MD 09/30/2023, 12:20 PM

## 2023-10-01 DIAGNOSIS — R339 Retention of urine, unspecified: Secondary | ICD-10-CM

## 2023-10-01 DIAGNOSIS — R739 Hyperglycemia, unspecified: Secondary | ICD-10-CM

## 2023-10-01 DIAGNOSIS — A599 Trichomoniasis, unspecified: Secondary | ICD-10-CM

## 2023-10-01 DIAGNOSIS — K1370 Unspecified lesions of oral mucosa: Secondary | ICD-10-CM

## 2023-10-01 DIAGNOSIS — J9601 Acute respiratory failure with hypoxia: Secondary | ICD-10-CM | POA: Diagnosis not present

## 2023-10-01 DIAGNOSIS — F1490 Cocaine use, unspecified, uncomplicated: Secondary | ICD-10-CM

## 2023-10-01 DIAGNOSIS — N761 Subacute and chronic vaginitis: Secondary | ICD-10-CM | POA: Diagnosis not present

## 2023-10-01 DIAGNOSIS — N611 Abscess of the breast and nipple: Secondary | ICD-10-CM | POA: Diagnosis not present

## 2023-10-01 DIAGNOSIS — I10 Essential (primary) hypertension: Secondary | ICD-10-CM

## 2023-10-01 LAB — COMPREHENSIVE METABOLIC PANEL
ALT: 14 U/L (ref 0–44)
AST: 16 U/L (ref 15–41)
Albumin: 3.1 g/dL — ABNORMAL LOW (ref 3.5–5.0)
Alkaline Phosphatase: 45 U/L (ref 38–126)
Anion gap: 9 (ref 5–15)
BUN: 9 mg/dL (ref 6–20)
CO2: 24 mmol/L (ref 22–32)
Calcium: 8.9 mg/dL (ref 8.9–10.3)
Chloride: 105 mmol/L (ref 98–111)
Creatinine, Ser: 0.56 mg/dL (ref 0.44–1.00)
GFR, Estimated: >60 mL/min (ref >60)
Glucose, Bld: 150 mg/dL — ABNORMAL HIGH (ref 70–99)
Potassium: 4.5 mmol/L (ref 3.5–5.1)
Sodium: 138 mmol/L (ref 135–145)
Total Bilirubin: 0.2 mg/dL (ref 0.0–1.2)
Total Protein: 6.5 g/dL (ref 6.5–8.1)

## 2023-10-01 LAB — BLOOD GAS, VENOUS
Acid-Base Excess: 2.2 mmol/L — ABNORMAL HIGH (ref 0.0–2.0)
Bicarbonate: 28.3 mmol/L — ABNORMAL HIGH (ref 20.0–28.0)
O2 Saturation: 96.4 %
Patient temperature: 37
pCO2, Ven: 49 mm[Hg] (ref 44–60)
pH, Ven: 7.37 (ref 7.25–7.43)
pO2, Ven: 73 mm[Hg] — ABNORMAL HIGH (ref 32–45)

## 2023-10-01 LAB — CBC
HCT: 38.6 % (ref 36.0–46.0)
Hemoglobin: 12 g/dL (ref 12.0–15.0)
MCH: 26.1 pg (ref 26.0–34.0)
MCHC: 31.1 g/dL (ref 30.0–36.0)
MCV: 84.1 fL (ref 80.0–100.0)
Platelets: 376 10*3/uL (ref 150–400)
RBC: 4.59 MIL/uL (ref 3.87–5.11)
RDW: 15.3 % (ref 11.5–15.5)
WBC: 9.5 10*3/uL (ref 4.0–10.5)
nRBC: 0 % (ref 0.0–0.2)

## 2023-10-01 MED ORDER — DEXAMETHASONE 6 MG PO TABS
6.0000 mg | ORAL_TABLET | Freq: Every day | ORAL | Status: AC
  Administered 2023-10-01 – 2023-10-03 (×3): 6 mg via ORAL
  Filled 2023-10-01 (×3): qty 1

## 2023-10-01 NOTE — TOC Initial Note (Signed)
 Transition of Care [facility]) - Initial/Assessment Note    Patient Details  Name: Amy Pennington MRN: [id number] Date of Birth: Mar 02, 1981  [facility] CM/SW Contact:    Leita CHRISTELLA Carlota Liane, LCSWA Phone Number: 10/01/2023, 10:45 AM  Clinical Narrative:                  CSW received consult for PCP needs. CSW added PCP options to AVS.       Patient Goals and CMS Choice            Expected Discharge Plan and Services                                              Prior Living Arrangements/Services                       Activities of Daily Living      Permission Sought/Granted                  Emotional Assessment              Admission diagnosis:  Breast abscess [N61.1] Vaginal pain [R10.2] Urinary tract infection, acute [N39.0] Desquamative inflammatory vaginitis [N76.1] Patient Active Problem List   Diagnosis Date Noted   Acute hypoxic respiratory failure (HCC) 09/30/2023   Desquamative inflammatory vaginitis 09/28/2023   Essential hypertension 09/28/2023   Oral mucosal lesion 09/28/2023   Cocaine use 09/28/2023   Trichomoniasis 09/28/2023   Urinary retention 09/28/2023   CIN I (cervical intraepithelial neoplasia I) 06/21/2019   Atypical squamous cells cannot exclude high grade squamous intraepithelial lesion on cytologic smear of cervix (ASC-H) 11/13/2018   Sepsis (HCC) 10/28/2017   Septic shock (HCC)    Asthma exacerbation 09/11/2016   Breast abscess of female 03/19/2016   Asthma 09/08/2013   PCP:  Freddrick No Pharmacy:   [facility] [address] ([address]), [place] - [address] [place]) [place] [address] Phone: [phone] Fax: [phone]     Social Drivers of Health (SDOH) Social History: SDOH Screenings   Tobacco Use: High Risk (09/28/2023)   SDOH Interventions:     Readmission Risk Interventions     No data to display

## 2023-10-01 NOTE — Assessment & Plan Note (Signed)
 Being managed by GYN. -Continue with metronidazole

## 2023-10-01 NOTE — Progress Notes (Signed)
 Consultation Progress Note   Patient: Amy Pennington FMW:[id number] DOB: 05/10/1981 DOA: 09/28/2023 DOS: the patient was seen and examined on 10/01/2023 Primary service: Schermerhorn, Debby PARAS, *  Brief hospital course: Taken from consult note.  Amy Pennington is a 43 y.o. female with past medical history of asthma, morbid obesity with BMI of 48, who presents to the hospital for left breast abscess and vaginal ulceration concerning for desquamative vaginitis.  TRH consulted for shortness of breath and hypoxia.   Amy Pennington states that she has been experiencing low oxygen  at bedtime and oxygen  has helped this, however last night, she did not feel well.  Then this morning, when she was standing up to get something that fell down onto the floor, she felt shortness of breath.  Her oxygen  levels were checked and were low in the 70s.  She was placed on 2 L with rapid recovery.  At this time, she denies any shortness of breath, cough, rhinorrhea, sinus congestion.   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    Patient symptoms started after taking Bactrim  for breast abscess which was discontinued.  Current differential of SJS or Behcet's syndrome. [facility] transfer was attempted for concern of SJS but they refused stating that it has to be biopsy-proven.  Biopsies were taken by gynecology with pending results.  ESR elevated at 30, CRP elevated at 2.3.  Wet prep with clue cells and trichomoniasis for which she was started on metronidazole .  Hep C antibody, RPR and HIV negative.  COVID-19 PCR negative.  UDS positive for cocaine and A1c of 6.  Rest of the labs without any significant abnormality. CT chest was negative for any acute cardiopulmonary disease.  Mild hypoxia and shortness of breath is likely due to obesity  hypoventilation syndrome and obstructive sleep apnea.  Overnight pulse oximetry did show mild intermittent desaturation below 88% despite being on 2 L of oxygen . Patient was ambulated today and desaturating below 88% with ambulation requiring at least 2 L of oxygen .  VBG with no CO2 retention.  Patient will need outpatient sleep study for further evaluation but meanwhile we will need to liter of oxygen  all the time specially during sleeping and ambulation.  Also started on Decadron  as it seems helping with some improvement in her ulcerations.  Differential of viral versus SJS with pending pathology results.    Assessment and Plan: * Desquamative inflammatory vaginitis Patient presented with vaginal ulcerations and subsequently developed oral ulcerations concerning for viral infection versus SJS, although the latter is less likely given clinical stability.  Another consideration was Behet disease but patient did not had prior episodes, no ocular or neurologic lesions.  Biopsy was obtained with results pending. -Give her a trial of Decadron  - Management per OB/GYN - Will follow-up pathology results  Oral mucosal lesion Otolaryngology was consulted and patient was found to have shallow ulcers on lateral pharyngeal wall, no laryngeal or hypopharynx involvement.  Symptoms started improving with 1 dose of IV Decadron . -Start her on Decadron  6 mg daily for 3 days  Acute hypoxic respiratory failure (HCC) Per chart review, patient has been experiencing nocturnal desaturations requiring 2 L of supplemental oxygen  consistent with OSA in the setting of morbid obesity.  Chest imaging negative.  VBG with no hypercarbia, desaturation with ambulation, most consistent with OSA and possibly obesity hypoventilation  syndrome.  -Patient need 2 L of oxygen  with ambulation and while sleeping at this time. -Patient need outpatient sleep study for further evaluation and intervention. -Continue with supportive care  and supplemental oxygen   Breast abscess of female Patient is currently on Augmentin  per surgery.  - Continued management per general surgery  Trichomoniasis Being managed by GYN. -Continue with metronidazole   Elevated random blood glucose level Mildly elevated blood glucose today.  Patient did receive 10 mg of IV Decadron  yesterday.  No diagnosis of diabetes -Check A1c -Continue to monitor  Urinary retention She was having painful urination due to extensive ulceration so Foley catheter was placed. She was requesting it voiding trial to be postponed until tomorrow as ulceration seems improving. -Will give her voiding trial tomorrow    TRH will continue to follow the patient.  Subjective: Patient was seen and examined today.  Still needing 2 L of oxygen .  Improving odontophagia and genital ulceration and pain.  Requesting to postpone voiding trial for another day.  Physical Exam: Vitals:   10/01/23 0551 10/01/23 0826 10/01/23 0843 10/01/23 1215  BP: 125/87 120/78 129/86 130/69  Pulse: 61 76 (!) 59 61  Resp: 18 18 18 18   Temp: 97.8 F (36.6 C) 98.6 F (37 C) (!) 97.4 F (36.3 C) 98.6 F (37 C)  TempSrc: Oral Oral Oral Oral  SpO2: 100% 98% 95%   Weight:      Height:       General.  Morbidly obese lady, in no acute distress.  Buccal mucosal ulceration. Pulmonary.  Lungs clear bilaterally, normal respiratory effort. CV.  Regular rate and rhythm, no JVD, rub or murmur. Abdomen.  Soft, nontender, nondistended, BS positive. CNS.  Alert and oriented .  No focal neurologic deficit. Extremities.  No edema, no cyanosis, pulses intact and symmetrical. Psychiatry.  Judgment and insight appears normal.   Data Reviewed: Prior data reviewed  Family Communication: Discussed with mother at bedside  Time spent: 50 minutes.  This record has been created using Conservation officer, historic buildings. Errors have been sought and corrected,but may not always be located. Such creation  errors do not reflect on the standard of care.   Author: Amaryllis Dare, MD 10/01/2023 2:23 PM  For on call review [email or web address].

## 2023-10-01 NOTE — Progress Notes (Signed)
 Subjective:HD#4  1 desquamative vulvovaginitis  2Acute hypoxic respiratory failure  3Left breast abscess ENT and Hospitalists consult completed and read Patient reports reports both oral discomfort and vulvar pain are improving  She is able to eat small amt of solid, soft food  PO intake adequate  Foley catheter in place and she is still afraid of removal because of vulvar pain   Objective: I have reviewed patient's vital signs, intake and output, medications, labs, pathology, and consult reports .  Oropharynx: large shallow ulcerations still present  LUNGs: by Hospitalist- no wheezing  Pelvic deferred     Assessment/Plan: 1.Desquamative vulvitis , oral ulceration - differential dx: Behcet's, Elspeth louder syndrome, or other viral etiology . Mild improvement Awaiting pathology.Consultation for read by Monday  . IV steroids yesterday . Will add 3 day course of oral steroids .Continue HC vaginal cream, foley catheter due to painful urination  Reg diet . Goal to remove foley tomorrow .   2. Breast abscess s/p I+D . Dr Tye pulled packing -he feels abscess is improving . Cont Augmentin    3.  Acute hypoxic respiratory failure (HCC) . Sleep study results pending . Will assess O2 saturation with ambulation today without O2 to see if she qualifies for CPAP machine at home . Hospitalist is evaluating this   4. Nutrition adequate with normal electrolytes and pt is advancing diet . Stopping IV fluids Check A1C for diabetes status   5. TOC consult completed . She does has PCP at [facility] , but underutilizes their care   40 minutes in patient  care     LOS: 2 days    Amy JINNY Dinsmore, MD 10/01/2023, 12:43 PM

## 2023-10-01 NOTE — Hospital Course (Addendum)
 Taken from consult note.  Amy Pennington is a 43 y.o. female with past medical history of asthma, morbid obesity with BMI of 48, who presents to the hospital for left breast abscess and vaginal ulceration concerning for desquamative vaginitis.  TRH consulted for shortness of breath and hypoxia.   [name] states that she has been experiencing low oxygen  at bedtime and oxygen  has helped this, however last night, she did not feel well.  Then this morning, when she was standing up to get something that fell down onto the floor, she felt shortness of breath.  Her oxygen  levels were checked and were low in the 70s.  She was placed on 2 L with rapid recovery.  At this time, she denies any shortness of breath, cough, rhinorrhea, sinus congestion.   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    Patient symptoms started after taking Bactrim  for breast abscess which was discontinued.  Current differential of SJS or Behcet's syndrome. [facility] transfer was attempted for concern of SJS but they refused stating that it has to be biopsy-proven.  Biopsies were taken by gynecology with pending results.  ESR elevated at 30, CRP elevated at 2.3.  Wet prep with clue cells and trichomoniasis for which she was started on metronidazole .  Hep C antibody, RPR and HIV negative.  COVID-19 PCR negative.  UDS positive for cocaine and A1c of 6.  Rest of the labs without any significant abnormality. CT chest was negative for any acute cardiopulmonary disease.  Mild hypoxia and shortness of breath is likely due to obesity hypoventilation syndrome and obstructive sleep apnea.  Overnight pulse oximetry did show mild intermittent desaturation below 88% despite being on 2 L of oxygen . Patient was ambulated today and desaturating below 88% with  ambulation requiring at least 2 L of oxygen .  VBG with no CO2 retention.  Patient will need outpatient sleep study for further evaluation but meanwhile we will need to liter of oxygen  all the time specially during sleeping and ambulation.  Also started on Decadron  as it seems helping with some improvement in her ulcerations.  Differential of viral versus SJS with pending pathology results.  1/5: Clinically seems improving.  Home oxygen  was delivered.  Continuing a trial of Decadron , Foley catheter was removed to give her if voiding trial using lidocaine  cream before voiding to help with pain. Pending biopsy-per GYN expecting Monday.  1/6: Patient continued to improve.  Able to void without any difficulty.  Lesions are improving.  Patient likely have either viral versus Bactrim  allergy and should avoid Bactrim  in future.  Patient also need to have a sleep study done by her PCP for further evaluation.

## 2023-10-01 NOTE — Assessment & Plan Note (Signed)
 She was having painful urination due to extensive ulceration so Foley catheter was placed. She was requesting it voiding trial to be postponed until tomorrow as ulceration seems improving. -Will give her voiding trial tomorrow

## 2023-10-01 NOTE — Assessment & Plan Note (Signed)
 Otolaryngology was consulted and patient was found to have shallow ulcers on lateral pharyngeal wall, no laryngeal or hypopharynx involvement.  Symptoms started improving with 1 dose of IV Decadron. -Start her on Decadron 6 mg daily for 3 days

## 2023-10-01 NOTE — Assessment & Plan Note (Signed)
 Mildly elevated blood glucose today.  Patient did receive 10 mg of IV Decadron yesterday.  No diagnosis of diabetes -Check A1c -Continue to monitor

## 2023-10-01 NOTE — Progress Notes (Signed)
 Subjective:  CC: Amy Pennington is a 43 y.o. female  Hospital stay day 2, 3 Days Post-Op left breast abscess I&D  HPI: No specific complaints at left breast abscess site  ROS:  General: Denies weight loss, weight gain, fatigue, fevers, chills, and night sweats. Heart: Denies chest pain, palpitations, racing heart, irregular heartbeat, leg pain or swelling, and decreased activity tolerance. Respiratory: Denies breathing difficulty, shortness of breath, wheezing, cough, and sputum. GI: Denies change in appetite, heartburn, nausea, vomiting, constipation, diarrhea, and blood in stool. GU: Denies difficulty urinating, pain with urinating, urgency, frequency, blood in urine.   Objective:   Temp:  [97.4 F (36.3 C)-98.6 F (37 C)] 97.4 F (36.3 C) (01/04 0843) Pulse Rate:  [59-76] 59 (01/04 0843) Resp:  [18-20] 18 (01/04 0843) BP: (111-140)/(62-91) 129/86 (01/04 0843) SpO2:  [95 %-100 %] 98 % (01/04 0826)     Height: 5' 3 (160 cm) Weight: 124.3 kg BMI (Calculated): 48.55   Intake/Output this shift:   Intake/Output Summary (Last 24 hours) at 10/01/2023 0858 Last data filed at 10/01/2023 0617 Gross per 24 hour  Intake 2492.92 ml  Output 2850 ml  Net -357.08 ml    Constitutional :  alert, cooperative, appears stated age, and no distress  Respiratory:  clear to auscultation bilaterally  Cardiovascular:  regular rate and rhythm  Gastrointestinal: soft, non-tender; bowel sounds normal; no masses,  no organomegaly.   Skin: Cool and moist.  Chaperone present for exam.  Left breast abscess site with improving induration.  Packing still in place  Psychiatric: Normal affect, non-agitated, not confused       LABS:     Latest Ref Rng & Units 10/01/2023    5:17 AM 09/29/2023    5:37 AM 09/28/2023    2:10 AM  CMP  Glucose 70 - 99 mg/dL 849  845  899   BUN 6 - 20 mg/dL 9  11  13    Creatinine 0.44 - 1.00 mg/dL 9.43  9.35  9.07   Sodium 135 - 145 mmol/L 138  136  138   Potassium 3.5 - 5.1  mmol/L 4.5  4.5  4.0   Chloride 98 - 111 mmol/L 105  104  104   CO2 22 - 32 mmol/L 24  24  22    Calcium 8.9 - 10.3 mg/dL 8.9  8.5  8.9   Total Protein 6.5 - 8.1 g/dL 6.5  6.3  6.5   Total Bilirubin 0.0 - 1.2 mg/dL 0.2  0.6  0.4   Alkaline Phos 38 - 126 U/L 45  45  45   AST 15 - 41 U/L 16  14  14    ALT 0 - 44 U/L 14  12  13        Latest Ref Rng & Units 10/01/2023    5:17 AM 09/30/2023    4:32 AM 09/29/2023    5:37 AM  CBC  WBC 4.0 - 10.5 K/uL 9.5  8.5  12.7   Hemoglobin 12.0 - 15.0 g/dL 87.9  89.2  88.2   Hematocrit 36.0 - 46.0 % 38.6  34.6  36.8   Platelets 150 - 400 K/uL 376  347  372     RADS: N/a Assessment:   Status post I&D of left breast abscess  Packing removed at bedside and scant purulent drainage still able to be expressed.  Much improved induration and tenderness to palpation.  Recommend keeping area covered and changing gauze daily and continue to monitor for now.  Low  threshold for repeat imaging if symptoms recur or worsens.  Continue Augmentin .  Further management regarding other medical issues per primary team.  Surgery will follow up with patient in 1 week unless additional questions or concerns. labs/images/medications/previous chart entries reviewed personally and relevant changes/updates noted above.

## 2023-10-01 NOTE — TOC Initial Note (Addendum)
 Transition of Care [facility]) - Initial/Assessment Note    Patient Details  Name: Amy Pennington MRN: [id number] Date of Birth: [birth year]  Transition of Care [facility]) CM/SW Contact:    Amy Pennington, LCSWA Phone Number: 10/01/2023, 2:04 PM  Clinical Narrative:                  CSW spoke with MD who would like order home oxygen  for patient. MD confirmed DC likely 1/5. CSW spoke with [employer] for orders needed for 1/5.   2:29PM:  PER RN: While ambulating with 2L of O2, her O2 stayed at 98-100. Pennington desats.     Durable Medical Equipment  (From admission, onward)           Start     Ordered   10/01/23 1320  For home use only DME oxygen   Once       Question Answer Comment  Length of Need Lifetime   Mode or (Route) Nasal cannula   Liters per Minute 2   Frequency Continuous (stationary and portable oxygen  unit needed)   Oxygen  conserving device Yes   Oxygen  delivery system Gas      10/01/23 1319                  Patient Goals and CMS Choice            Expected Discharge Plan and Services                                              Prior Living Arrangements/Services                       Activities of Daily Living      Permission Sought/Granted                  Emotional Assessment              Admission diagnosis:  Breast abscess [N61.1] Vaginal pain [R10.2] Urinary tract infection, acute [N39.0] Desquamative inflammatory vaginitis [N76.1] Patient Active Problem List   Diagnosis Date Noted   Elevated random blood glucose level 10/01/2023   Acute hypoxic respiratory failure (HCC) 09/30/2023   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 ([address]), [place] - [address] [place]) [place] [address] Phone: [phone] Fax: [phone]     Social Drivers of Health (SDOH) Social History: SDOH Screenings   Tobacco Use: High Risk (09/28/2023)   SDOH Interventions:     Readmission Risk Interventions     Pennington data to display

## 2023-10-02 DIAGNOSIS — K1370 Unspecified lesions of oral mucosa: Secondary | ICD-10-CM | POA: Diagnosis not present

## 2023-10-02 DIAGNOSIS — N761 Subacute and chronic vaginitis: Secondary | ICD-10-CM | POA: Diagnosis not present

## 2023-10-02 DIAGNOSIS — J9601 Acute respiratory failure with hypoxia: Secondary | ICD-10-CM | POA: Diagnosis not present

## 2023-10-02 DIAGNOSIS — N611 Abscess of the breast and nipple: Secondary | ICD-10-CM | POA: Diagnosis not present

## 2023-10-02 LAB — CBC
HCT: 37.4 % (ref 36.0–46.0)
Hemoglobin: 11.9 g/dL — ABNORMAL LOW (ref 12.0–15.0)
MCH: 26.6 pg (ref 26.0–34.0)
MCHC: 31.8 g/dL (ref 30.0–36.0)
MCV: 83.5 fL (ref 80.0–100.0)
Platelets: 443 10*3/uL — ABNORMAL HIGH (ref 150–400)
RBC: 4.48 MIL/uL (ref 3.87–5.11)
RDW: 15.3 % (ref 11.5–15.5)
WBC: 18.3 10*3/uL — ABNORMAL HIGH (ref 4.0–10.5)
nRBC: 0 % (ref 0.0–0.2)

## 2023-10-02 LAB — COMPREHENSIVE METABOLIC PANEL
ALT: 13 U/L (ref 0–44)
AST: 14 U/L — ABNORMAL LOW (ref 15–41)
Albumin: 3.1 g/dL — ABNORMAL LOW (ref 3.5–5.0)
Alkaline Phosphatase: 45 U/L (ref 38–126)
Anion gap: 7 (ref 5–15)
BUN: 10 mg/dL (ref 6–20)
CO2: 27 mmol/L (ref 22–32)
Calcium: 8.7 mg/dL — ABNORMAL LOW (ref 8.9–10.3)
Chloride: 103 mmol/L (ref 98–111)
Creatinine, Ser: 0.54 mg/dL (ref 0.44–1.00)
GFR, Estimated: >60 mL/min (ref >60)
Glucose, Bld: 121 mg/dL — ABNORMAL HIGH (ref 70–99)
Potassium: 4.1 mmol/L (ref 3.5–5.1)
Sodium: 137 mmol/L (ref 135–145)
Total Bilirubin: <0.2 mg/dL (ref 0.0–1.2)
Total Protein: 6.4 g/dL — ABNORMAL LOW (ref 6.5–8.1)

## 2023-10-02 NOTE — Progress Notes (Signed)
 Subjective:HD#5 Desquamative vulvovaginitis  Oral ulcerations Acute respiratory hypoxia  Patient reports oral and vulvar area feeling better  Tolerating solid food .     Objective: I have reviewed patient's vital signs, intake and output, medications, and labs. Results for orders placed or performed during the hospital encounter of 09/28/23 (from the past 24 hours)  Comprehensive metabolic panel     Status: Abnormal   Collection Time: 10/02/23  7:52 AM  Result Value Ref Range   Sodium 137 135 - 145 mmol/L   Potassium 4.1 3.5 - 5.1 mmol/L   Chloride 103 98 - 111 mmol/L   CO2 27 22 - 32 mmol/L   Glucose, Bld 121 (H) 70 - 99 mg/dL   BUN 10 6 - 20 mg/dL   Creatinine, Ser 9.45 0.44 - 1.00 mg/dL   Calcium 8.7 (L) 8.9 - 10.3 mg/dL   Total Protein 6.4 (L) 6.5 - 8.1 g/dL   Albumin 3.1 (L) 3.5 - 5.0 g/dL   AST 14 (L) 15 - 41 U/L   ALT 13 0 - 44 U/L   Alkaline Phosphatase 45 38 - 126 U/L   Total Bilirubin <0.2 0.0 - 1.2 mg/dL   GFR, Estimated >39 >39 mL/min   Anion gap 7 5 - 15  CBC     Status: Abnormal   Collection Time: 10/02/23  7:52 AM  Result Value Ref Range   WBC 18.3 (H) 4.0 - 10.5 K/uL   RBC 4.48 3.87 - 5.11 MIL/uL   Hemoglobin 11.9 (L) 12.0 - 15.0 g/dL   HCT 62.5 63.9 - 53.9 %   MCV 83.5 80.0 - 100.0 fL   MCH 26.6 26.0 - 34.0 pg   MCHC 31.8 30.0 - 36.0 g/dL   RDW 84.6 88.4 - 84.4 %   Platelets 443 (H) 150 - 400 K/uL   nRBC 0.0 0.0 - 0.2 %     General: alert and cooperative Resp: clear to auscultation bilaterally Cardio: regular rate and rhythm, S1, S2 normal, no murmur, click, rub or gallop Pelvic : left labial ulceration , decreasing in size .mild TTP Oral ; Right lateral soft palate lesion less erythema   Assessment/Plan: Presumed Viral desquamative vulvovaginitis  and oropharynx involvement  Improving  Assessment and Plan: * Desquamative inflammatory vaginitis Patient presented with vaginal ulcerations and subsequently developed oral ulcerations concerning  for viral infection versus SJS, although the latter is less likely given clinical stability.  Another consideration was Behet disease but patient did not had prior episodes, no ocular or neurologic lesions.  Biopsy was obtained with results pending. -Give her a trial of Decadron , now po decadron   Pull foley catheter and prior to urination apply lidocaine  gel  - Management per OB/GYN - Will follow-up pathology results, expected Monday    Oral mucosal lesion Otolaryngology was consulted and patient was found to have shallow ulcers on lateral pharyngeal wall, no laryngeal or hypopharynx involvement.  Symptoms started improving with 1 dose of IV Decadron . -day #2 po  Decadron  6 mg daily for 3 days total   Acute hypoxic respiratory failure (HCC) Per chart review, patient has been experiencing nocturnal desaturations requiring 2 L of supplemental oxygen  consistent with OSA in the setting of morbid obesity.  Chest imaging negative.  VBG with no hypercarbia, desaturation with ambulation, most consistent with OSA and possibly obesity hypoventilation syndrome.  -Patient need 2 L of oxygen  with ambulation and while sleeping at this time. -Patient need outpatient sleep study for further evaluation and intervention. -Continue with supportive  care and supplemental oxygen    Breast abscess of female Patient is currently on Augmentin  per surgery.   - Continued management per general surgery   Trichomoniasis Being managed by GYN. -Continue with metronidazole    Elevated random blood glucose level Mildly elevated blood glucose today.  .  No diagnosis of diabetes -Check A1c -Continue to monitor  Anticipate d/c tomorrow 10/03/23     Amy JINNY Dinsmore, MD 10/02/2023, 12:37 PM

## 2023-10-02 NOTE — Progress Notes (Signed)
 Consultation Progress Note   Patient: Amy Pennington FMW:[id number] DOB: 1980-11-23 DOA: 09/28/2023 DOS: the patient was seen and examined on 10/02/2023 Primary service: Schermerhorn, Debby PARAS, *  Brief hospital course: Taken from consult note.  Deem CHERYLIN WAGUESPACK is a 43 y.o. female with past medical history of asthma, morbid obesity with BMI of 48, who presents to the hospital for left breast abscess and vaginal ulceration concerning for desquamative vaginitis.  TRH consulted for shortness of breath and hypoxia.   Ms. Uzelac states that she has been experiencing low oxygen  at bedtime and oxygen  has helped this, however last night, she did not feel well.  Then this morning, when she was standing up to get something that fell down onto the floor, she felt shortness of breath.  Her oxygen  levels were checked and were low in the 70s.  She was placed on 2 L with rapid recovery.  At this time, she denies any shortness of breath, cough, rhinorrhea, sinus congestion.   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    Patient symptoms started after taking Bactrim  for breast abscess which was discontinued.  Current differential of SJS or Behcet's syndrome. [facility] transfer was attempted for concern of SJS but they refused stating that it has to be biopsy-proven.  Biopsies were taken by gynecology with pending results.  ESR elevated at 30, CRP elevated at 2.3.  Wet prep with clue cells and trichomoniasis for which she was started on metronidazole .  Hep C antibody, RPR and HIV negative.  COVID-19 PCR negative.  UDS positive for cocaine and A1c of 6.  Rest of the labs without any significant abnormality. CT chest was negative for any acute cardiopulmonary disease.  Mild hypoxia and shortness of breath is likely due to obesity  hypoventilation syndrome and obstructive sleep apnea.  Overnight pulse oximetry did show mild intermittent desaturation below 88% despite being on 2 L of oxygen . Patient was ambulated today and desaturating below 88% with ambulation requiring at least 2 L of oxygen .  VBG with no CO2 retention.  Patient will need outpatient sleep study for further evaluation but meanwhile we will need to liter of oxygen  all the time specially during sleeping and ambulation.  Also started on Decadron  as it seems helping with some improvement in her ulcerations.  Differential of viral versus SJS with pending pathology results.  1/5: Clinically seems improving.  Home oxygen  was delivered.  Continuing a trial of Decadron , Foley catheter was removed to give her if voiding trial using lidocaine  cream before voiding to help with pain. Pending biopsy-per GYN expecting Monday   Assessment and Plan: * Desquamative inflammatory vaginitis Patient presented with vaginal ulcerations and subsequently developed oral ulcerations concerning for viral infection versus SJS, although the latter is less likely given clinical stability.  Another consideration was Behet disease but patient did not had prior episodes, no ocular or neurologic lesions.  Biopsy was obtained with results pending. -Give her a trial of Decadron  - Management per OB/GYN - Will follow-up pathology results  Oral mucosal lesion Otolaryngology was consulted and patient was found to have shallow ulcers on lateral pharyngeal wall, no laryngeal or hypopharynx involvement.  Symptoms started improving with 1 dose of IV Decadron . -Start her on Decadron  6 mg daily for 3 days  Acute hypoxic respiratory failure (HCC) Per chart review, patient has  been experiencing nocturnal desaturations requiring 2 L of supplemental oxygen  consistent with OSA in the setting of morbid obesity.  Chest imaging negative.  VBG with no hypercarbia, desaturation with ambulation, most  consistent with OSA and possibly obesity hypoventilation syndrome.  -Patient need 2 L of oxygen  with ambulation and while sleeping at this time. -Patient need outpatient sleep study for further evaluation and intervention. -Continue with supportive care and supplemental oxygen   Breast abscess of female Patient is currently on Augmentin  per surgery.  - Continued management per general surgery  Trichomoniasis Being managed by GYN. -Continue with metronidazole   Elevated random blood glucose level Mildly elevated blood glucose today.  Patient did receive 10 mg of IV Decadron  yesterday.  No diagnosis of diabetes -Check A1c -Continue to monitor  Urinary retention She was having painful urination due to extensive ulceration so Foley catheter was placed initially and was removed today to give her a voiding trial. -Lidocaine  gel to use before voiding to help with pain    TRH will continue to follow the patient.  Subjective: Patient was resting comfortably when seen today.  Per patient she was able to eat her breakfast with much improved pain with swallowing.  Her vulvar lesions are also improving.  Physical Exam: Vitals:   10/02/23 0500 10/02/23 0700 10/02/23 0925 10/02/23 1202  BP:   (!) 154/86 137/87  Pulse: 64 61 60   Resp:   18 18  Temp:   97.9 F (36.6 C) 98.2 F (36.8 C)  TempSrc:   Oral Oral  SpO2: 99% 99% 98%   Weight:      Height:       General.  Morbidly obese lady, in no acute distress. Pulmonary.  Lungs clear bilaterally, normal respiratory effort. CV.  Regular rate and rhythm, no JVD, rub or murmur. Abdomen.  Soft, nontender, nondistended, BS positive. CNS.  Alert and oriented .  No focal neurologic deficit. Extremities.  No edema, no cyanosis, pulses intact and symmetrical. Psychiatry.  Judgment and insight appears normal.   Data Reviewed: Prior data reviewed  Family Communication: Boyfriend at bedside  Time spent: 45 minutes.  This record has been  created using Conservation officer, historic buildings. Errors have been sought and corrected,but may not always be located. Such creation errors do not reflect on the standard of care.   Author: Amaryllis Dare, MD 10/02/2023 1:52 PM  For on call review [email or web address].

## 2023-10-02 NOTE — Assessment & Plan Note (Addendum)
 She was having painful urination due to extensive ulceration so Foley catheter was placed initially and and then removed Patient is able to void. -Lidocaine gel to use before voiding to help with pain

## 2023-10-02 NOTE — Assessment & Plan Note (Signed)
 Patient presented with vaginal ulcerations and subsequently developed oral ulcerations concerning for viral infection versus SJS, although the latter is less likely given clinical stability.  Another consideration was Behet disease but patient did not had prior episodes, no ocular or neurologic lesions.  Biopsy was obtained with results pending. -Give her a trial of Decadron  - Management per OB/GYN - Will follow-up pathology results

## 2023-10-03 DIAGNOSIS — J9601 Acute respiratory failure with hypoxia: Secondary | ICD-10-CM | POA: Diagnosis not present

## 2023-10-03 DIAGNOSIS — N761 Subacute and chronic vaginitis: Secondary | ICD-10-CM | POA: Diagnosis not present

## 2023-10-03 DIAGNOSIS — N611 Abscess of the breast and nipple: Secondary | ICD-10-CM | POA: Diagnosis not present

## 2023-10-03 DIAGNOSIS — R7303 Prediabetes: Secondary | ICD-10-CM | POA: Diagnosis present

## 2023-10-03 DIAGNOSIS — K1370 Unspecified lesions of oral mucosa: Secondary | ICD-10-CM | POA: Diagnosis not present

## 2023-10-03 DIAGNOSIS — R29818 Other symptoms and signs involving the nervous system: Secondary | ICD-10-CM | POA: Insufficient documentation

## 2023-10-03 LAB — CULTURE, BLOOD (ROUTINE X 2)
Culture: NO GROWTH
Culture: NO GROWTH
Special Requests: ADEQUATE
Special Requests: ADEQUATE

## 2023-10-03 LAB — HEMOGLOBIN A1C
Hgb A1c MFr Bld: 5.9 % — ABNORMAL HIGH (ref 4.8–5.6)
Mean Plasma Glucose: 123 mg/dL

## 2023-10-03 LAB — SURGICAL PATHOLOGY

## 2023-10-03 MED ORDER — AMOXICILLIN-POT CLAVULANATE 875-125 MG PO TABS: 1.0000 | ORAL_TABLET | Freq: Two times a day (BID) | ORAL | 0 refills | Status: AC

## 2023-10-03 MED ORDER — ESTROGENS CONJUGATED 0.625 MG/GM VA CREA: TOPICAL_CREAM | Freq: Every day | VAGINAL | 0 refills | Status: AC

## 2023-10-03 MED ORDER — ACETAMINOPHEN 500 MG PO TABS: 1000.0000 mg | ORAL_TABLET | Freq: Four times a day (QID) | ORAL | Status: AC | PRN

## 2023-10-03 MED ORDER — TRIAMCINOLONE ACETONIDE 0.1 % MT PSTE: PASTE | Freq: Three times a day (TID) | OROMUCOSAL | 0 refills | Status: AC

## 2023-10-03 MED ORDER — ACETAMINOPHEN 500 MG PO TABS: 1000.0000 mg | ORAL_TABLET | Freq: Four times a day (QID) | ORAL | Status: DC | PRN

## 2023-10-03 MED ORDER — MAGIC MOUTHWASH: 5.0000 mL | Freq: Three times a day (TID) | ORAL | 0 refills | Status: AC

## 2023-10-03 MED ORDER — METRONIDAZOLE 500 MG PO TABS: 500.0000 mg | ORAL_TABLET | Freq: Two times a day (BID) | ORAL | 0 refills | Status: AC

## 2023-10-03 MED ORDER — LIDOCAINE VISCOUS HCL 2 % MT SOLN: 15.0000 mL | Freq: Four times a day (QID) | OROMUCOSAL | 1 refills | Status: AC | PRN

## 2023-10-03 MED ORDER — HYDROCORTISONE 1 % EX CREA: TOPICAL_CREAM | CUTANEOUS | 0 refills | Status: AC

## 2023-10-03 MED ORDER — LIDOCAINE HCL URETHRAL/MUCOSAL 2 % EX GEL: 1.0000 | CUTANEOUS | 0 refills | Status: AC | PRN

## 2023-10-03 MED ORDER — OXYCODONE HCL 5 MG PO TABS
5.0000 mg | ORAL_TABLET | Freq: Four times a day (QID) | ORAL | 0 refills | Status: AC | PRN
Start: 2023-10-03 — End: 2023-10-07

## 2023-10-03 NOTE — Assessment & Plan Note (Signed)
 Otolaryngology was consulted and patient was found to have shallow ulcers on lateral pharyngeal wall, no laryngeal or hypopharynx involvement.  Symptoms started improving with 1 dose of IV Decadron. -Received 3 doses of p.o. Decadron afterwards

## 2023-10-03 NOTE — Progress Notes (Signed)
 Patient discharged home with boyfriend.  Discharge instructions, when to follow up, and medications reviewed with patient.  Patient verbalized understanding. Patient will be escorted out by staff.

## 2023-10-03 NOTE — Discharge Summary (Signed)
 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 G1P0010 with a hx of obesity, schizoaffective disorder, prediabetes, tobacco use, substance use, and asthma who presented to the ED for breast discharge and vaginal discharge. Pain limited the ability to perform a pelvic exam in the ED, so patient was taken to the OR on 09/27/22 for an exam under anesthesia which showed diffuse vaginal ulcerations- multiple biopsies were taken (see operative note for details). General surgery also performed an I&D of a left breast abscess at that time. Oral ulcerations were also discovered. Differential for mucosal ulcerations (vaginal and oral) is vast and includes  infectious/ secondary to viral syndrome (such as EBV), drug reaction- SJS, Bechets, Crohns. Given recent Bactrim , could not rule out SJS. Bactrim  was discontinued. She was admitted for monitoring and symptom management of severe pelvic and oral pain leading to insufficient PO intake and urinary retention. STI testing was negative other than trichomonas. A foley was placed and she was started on vaginal hydrocortisone  1% TID, vaginal estrogen nightly, and PRN PO viscous lidocaine . She received oxycodone  for breakthrough pain. Her pain improved and the foley was removed on 1/5 and she was able to void spontaneously without issues. ENT was consulted for the oral ulcers (see separate consult note)- on exam she had tongue, hard  palate, soft palate, and buccal mucosal ulcers.  They performed a flexible laryngoscopy showing small ulcerations of lateral pharyngeal wall, patent airway. S/p IV dexamethasone  10 mg on 09/30/23 and then PO dexamethasone  6 mg x 3 d (1/4-1/7).  For the vaginal lesions, she was discharged home on vaginal hydrocortisone  BID and vaginal estrace 1 applicator at bedtime. For the oral lesions, she was discharged home on magic mouthwash TID and triamcinolone  paste TID for 7 days and viscous lidocaine  PRN. Pathology was still pending at time of discharge.   #Breast abscess - S/p left breast I&D in OR with general surgery - PO Augmentin  x 7d per general surgery- discharged home to complete total of 7 days  #Trichomoniasis - PO metronidazole  500 mg BID x 7 days for trichomoniasis.  - Discussed partner treatment (rx sent for boyfriend Bernhard Rushing DOB 04/27/93) and abstinence until partner treated  #Acute hypoxic respiratory failure #Suspected OSA #Hx asthma - Patient was noted to desat overnight 1/1-1/2. Also dropped O2 sat intermittently during ambulation requiring O2. COVID/flu/RSV negative on admission. Patient does not get routine care outpatient, so unclear if this is is acute or just undiagnosed and chronic. Suspect OSA but will need a formal sleep study outpatient (referral placed). Hospitalists were consulted. CT chest 09/30/23 wnl other than linear scarring versus atelectasis in dependent lower lobe. She used oxygen  PRN (mostly with ambulation and overnight) to maintain O2 sats. Per hospitalists, discharged home with oxygen .  - Needs to establish with PCP- referral placed  #Prediabetes- A1c 6.0 on 09/28/23 - Will need to establish with PCP outpatient  #HTN - Diagnosed this admission due to elevated BPs here - Will need to establish with PCP outpatient   #Substance use - UDS + cocaine  Discharge Medications:  Allergies as of 10/03/2023   No Known Allergies      Medication List     STOP  taking these medications    ferrous sulfate 325 (65 FE) MG tablet   sulfamethoxazole -trimethoprim  800-160 MG tablet Commonly known as: BACTRIM  DS       TAKE these medications    acetaminophen  500 MG tablet Commonly known as: TYLENOL  Take 2 tablets (1,000 mg total) by mouth every 6 (six) hours as needed.   albuterol  108 (90 Base) MCG/ACT inhaler Commonly known as: VENTOLIN  HFA Inhale 2 puffs into the lungs every 6 (six) hours as needed for wheezing or shortness of breath.   albuterol  (2.5 MG/3ML) 0.083% nebulizer solution Commonly known as: PROVENTIL  Take 3 mLs (2.5 mg total) by nebulization every 4 (four) hours as needed for wheezing or shortness of breath.   amoxicillin -clavulanate 875-125 MG tablet Commonly known as: AUGMENTIN  Take 1 tablet by mouth every 12 (twelve) hours for 5 doses.   conjugated estrogens  vaginal cream Commonly known as: PREMARIN  Place vaginally at bedtime.   hydrocortisone  cream 1 % Apply 1 applicator twice a day   lidocaine  2 % jelly Commonly known as: XYLOCAINE  Apply 1 Application topically as needed (to perineal area).   lidocaine  2 % solution Commonly known as: XYLOCAINE  Use as directed 15 mLs in the mouth or throat every 6 (six) hours as needed for up to 10 days for mouth pain.   magic mouthwash Soln Take 5 mLs by mouth 3 (three) times daily for 7 days. Suspension contains equal amounts of Maalox Extra Strength, nystatin , and diphenhydramine .   metroNIDAZOLE  500 MG tablet Commonly known as: FLAGYL  Take 1 tablet (500 mg total) by mouth 2 (two) times daily for 7 doses.   oxyCODONE  5 MG immediate release tablet Commonly known as: Oxy IR/ROXICODONE  Take 1 tablet (5 mg total) by mouth every 6 (six) hours as needed for up to 4 days for severe pain (pain score 7-10) or breakthrough pain.   triamcinolone  0.1 % paste Commonly known as: KENALOG  Use as directed in the mouth or throat 3 (three) times daily for 5 days.                Durable Medical Equipment  (From admission, onward)           Start     Ordered   10/01/23 1320  For home use only DME oxygen   Once       Question Answer Comment  Length of Need Lifetime   Mode or (Route) Nasal cannula   Liters per Minute 2   Frequency Continuous (stationary and portable oxygen  unit needed)   Oxygen  conserving device Yes   Oxygen  delivery system Gas      10/01/23 1319              Discharge Activities:  No driving while on narcotic pain medication.    Discharge Diet:  Regular diet    Discharge Instructions:  Take all medications as directed and keep all follow-up appointments. Do not smoke, drink alcohol, or use drugs. Seek care immediately should you experience fever, inability to keep down food or liquids.   Discharge Follow-Up:  - OB/GYN follow-up in 1 week - Internal medicine follow-up in 1 week - Referred for home sleep study   Beverli Dinsmore, MD, 10/03/2023

## 2023-10-03 NOTE — Progress Notes (Signed)
 She consultation Progress Note   Patient: Amy Pennington FMW:[id number] DOB: 1980-11-17 DOA: 09/28/2023 DOS: the patient was seen and examined on 10/03/2023 Primary service: Schermerhorn, Beverli GAILS,*  Brief hospital course: Taken from consult note.  Amy Pennington is a 43 y.o. female with past medical history of asthma, morbid obesity with BMI of 48, who presents to the hospital for left breast abscess and vaginal ulceration concerning for desquamative vaginitis.  TRH consulted for shortness of breath and hypoxia.   Amy Pennington states that she has been experiencing low oxygen  at bedtime and oxygen  has helped this, however last night, she did not feel well.  Then this morning, when she was standing up to get something that fell down onto the floor, she felt shortness of breath.  Her oxygen  levels were checked and were low in the 70s.  She was placed on 2 L with rapid recovery.  At this time, she denies any shortness of breath, cough, rhinorrhea, sinus congestion.   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    Patient symptoms started after taking Bactrim  for breast abscess which was discontinued.  Current differential of SJS or Behcet's syndrome. [facility] transfer was attempted for concern of SJS but they refused stating that it has to be biopsy-proven.  Biopsies were taken by gynecology with pending results.  ESR elevated at 30, CRP elevated at 2.3.  Wet prep with clue cells and trichomoniasis for which she was started on metronidazole .  Hep C antibody, RPR and HIV negative.  COVID-19 PCR negative.  UDS positive for cocaine and A1c of 6.  Rest of the labs without any significant abnormality. CT chest was negative for any acute cardiopulmonary disease.  Mild hypoxia and shortness of breath is likely due to obesity  hypoventilation syndrome and obstructive sleep apnea.  Overnight pulse oximetry did show mild intermittent desaturation below 88% despite being on 2 L of oxygen . Patient was ambulated today and desaturating below 88% with ambulation requiring at least 2 L of oxygen .  VBG with no CO2 retention.  Patient will need outpatient sleep study for further evaluation but meanwhile we will need to liter of oxygen  all the time specially during sleeping and ambulation.  Also started on Decadron  as it seems helping with some improvement in her ulcerations.  Differential of viral versus SJS with pending pathology results.  1/5: Clinically seems improving.  Home oxygen  was delivered.  Continuing a trial of Decadron , Foley catheter was removed to give her if voiding trial using lidocaine  cream before voiding to help with pain. Pending biopsy-per GYN expecting Monday.  1/6: Patient continued to improve.  Able to void without any difficulty.  Lesions are improving.  Patient likely have either viral versus Bactrim  allergy and should avoid Bactrim  in future.  Patient also need to have a sleep study done by her PCP for further evaluation.   Assessment and Plan: * Desquamative inflammatory vaginitis Patient presented with vaginal ulcerations and subsequently developed oral ulcerations concerning for viral infection versus SJS, although the latter is less likely given clinical stability.  Another consideration was Behet disease but patient did not had prior episodes, no ocular or neurologic lesions.  Biopsy was obtained with results pending. -Give her a trial of Decadron  - Management per OB/GYN - Will follow-up pathology results  Oral mucosal lesion Otolaryngology was consulted  and patient was found to have shallow ulcers on lateral pharyngeal wall, no laryngeal or hypopharynx involvement.  Symptoms started improving with 1 dose of IV Decadron . -Received 3 doses of p.o. Decadron  afterwards  Acute hypoxic  respiratory failure (HCC) Per chart review, patient has been experiencing nocturnal desaturations requiring 2 L of supplemental oxygen  consistent with OSA in the setting of morbid obesity.  Chest imaging negative.  VBG with no hypercarbia, desaturation with ambulation, most consistent with OSA and possibly obesity hypoventilation syndrome.  -Patient need 2 L of oxygen  with ambulation and while sleeping at this time. -Patient need outpatient sleep study for further evaluation and intervention. -Continue with supportive care and supplemental oxygen   Breast abscess of female Patient is currently on Augmentin  per surgery.  - Continued management per general surgery  Trichomoniasis Being managed by GYN. -Continue with metronidazole   Elevated random blood glucose level Mildly elevated blood glucose today.  Patient did receive 10 mg of IV Decadron  yesterday.  No diagnosis of diabetes -Check A1c -Continue to monitor  Urinary retention She was having painful urination due to extensive ulceration so Foley catheter was placed initially and and then removed Patient is able to void. -Lidocaine  gel to use before voiding to help with pain    TRH will sign off  Subjective: Patient was feeling much improved and resting comfortably when seen today.  She was eating well without any significant difficulty.  Voiding with the help of lidocaine  gel.  Lesions improving.  Physical Exam: Vitals:   10/03/23 0500 10/03/23 0700 10/03/23 0855 10/03/23 1144  BP:   137/83   Pulse: 67 62 68 88  Resp:   20   Temp:   97.9 F (36.6 C)   TempSrc:   Oral   SpO2: 98% 98% 97% 96%  Weight:      Height:       General.  Morbidly obese lady, in no acute distress. Pulmonary.  Lungs clear bilaterally, normal respiratory effort. CV.  Regular rate and rhythm, no JVD, rub or murmur. Abdomen.  Soft, nontender, nondistended, BS positive. CNS.  Alert and oriented .  No focal neurologic deficit. Extremities.  No edema,  no cyanosis, pulses intact and symmetrical. Psychiatry.  Judgment and insight appears normal.    Data Reviewed: Prior data reviewed  Family Communication: Boyfriend at bedside  Time spent: 44 minutes.  This record has been created using Conservation officer, historic buildings. Errors have been sought and corrected,but may not always be located. Such creation errors do not reflect on the standard of care.   Author: Amaryllis Dare, MD 10/03/2023 12:53 PM  For on call review [email or web address].

## 2023-10-03 NOTE — Progress Notes (Addendum)
 GYN Progress Note   Name: SHELANDA DUVALL MRN: [id number] Date of service: 10/03/2023    Subjective   Amy Pennington is doing well overall. Reports minimal pain that is tolerable. Reports vaginal creams and oral treatments are helpful. Tolerating PO without nausea or vomiting. Ambulating without assistance.  Voiding without difficulty. Ready to go home. Does not really have a primary and wants to establish with someone new.    Denies fevers/chills/CP/SOB   Objective   Temp:  [97.9 F (36.6 C)-98.7 F (37.1 C)] 97.9 F (36.6 C) (01/06 0855) Pulse Rate:  [62-99] 68 (01/06 0855) Resp:  [18-20] 20 (01/06 0855) BP: (137-143)/(75-92) 137/83 (01/06 0855) SpO2:  [95 %-99 %] 97 % (01/06 0855)  Intake/Output Summary (Last 24 hours) at 10/03/2023 0931 Last data filed at 10/02/2023 1400 Gross per 24 hour  Intake 350 ml  Output --  Net 350 ml     Gen: Alert, no acute distress, satting >97% on room air during our conversation CV: Regular rate Resp: No increased work of breathing Abd: Obese Ext: No peripheral edema.      Labs: Recent Labs    10/01/23 0517 10/02/23 0752  WBC 9.5 18.3*  HGB 12.0 11.9*  HCT 38.6 37.4  MCV 84.1 83.5  PLT 376 443*   Recent Labs    10/01/23 0517 10/02/23 0752  NA 138 137  K 4.5 4.1  CL 105 103  CO2 24 27  CREATININE 0.56 0.54  BUN 9 10  CALCIUM 8.9 8.7*  ALT 14 13  AST 16 14*     Assessment & Plan   Itsel DEVONY MCGRADY is a 43 y.o. G1P0010 with a hx of obesity, schizoaffective disorder, prediabetes, tobacco use, substance use, and asthma who is admitted for desquamative vaginitis and urinary retention.   #Mucosal ulcerations- oral and vaginal #Desquamative vaginitis - DDX: infectious/ secondary to viral syndrome (such as EBV), drug reaction- SJS, Bechets, Crohns.  - Exam in OR with erosive desquamative vaginitis, thin copious white/grey discharge, no discrete lesions, normal appearing cervix - CT A/P w/ contrast 09/28/23 1.3 cm right adrenal  adenoma, mild diverticulosis, otherwise no abnormal findings - S/p ENT consult due to oral involvement. Exam with ulcerations on buccal mucosa, tongue, soft palate, and hard palate. S/p flexible laryngoscopy showing small ulcerations of lateral pharyngeal wall, patent airway.  - Given recent Bactrim , cannot rule out SJS. Intra-op vaginal mucosal biopsies taken. Bactrim  discontinued on admission.  - Urinary retention present on admission due to pain. S/p foley- removed on 10/01/22 and voiding spontaneously.  - WBC count 9.9 > 12.7 > 8.5 > 9.5 >18.3. CRP and ESR on 09/28/23 elevated.  - BCx NG@5d  - Pathology in process - STI testing: HIV neg on 09/24/23. HCV Ab, HbsAg, RPR, and GC/CT neg. + Trich. - S/p PO diflucan  150 mg x 1  - PO tylenol  q8h PRN and oxycodone  q6h PRN - Vaginal ulcerations: Hydrocortisone  1% 1 applicator to vagina BID, Estrace 1 applicator at bedtime to prevent vaginal adhesions - Oral ulcerations: magic mouthwash TID, triamcinolone  paste TID - S/p IV dexamethasone  10 mg on 09/30/23 > PO dexamethasone  6 mg x 3 d (1/4-1/7)  #Trichomoniasis - PO metronidazole  500 mg BID x 7 days for trichomoniasis.  - Discussed partner treatment (rx sent for boyfriend Bernhard Rushing DOB 04/27/93) and abstinence until partner treated  #Acute hypoxic respiratory failure, etiology unknown #Hx asthma - Noted to desat overnight the first night. Also intermittently during ambulation. - Does not get routine care  outpatient, so unclear if this is is acute or just undiagnosed and chronic - CT chest 09/30/23 wnl other than linear scarring versus atelectasis in dependent lower lobe  - Suspect OSA is a component although does not explain desaturation during ambulation - Hospitalists following, appreciate input - Will need PCP follow-up  #Prediabetes- A1c 6.0 on 09/28/23 - Will need to establish with PCP outpatient   #Left breast abscess - S/p left breast I&D in OR with general surgery - PO Augmentin  x 7d per  general surgery    #HTN - Diagnosed this admission due to elevated BPs here - Will need to establish with PCP outpatient   #Substance use - UDS + cocaine   Dispo: discharge home today   Electronically signed: Beverli LULLA Dinsmore, MD, 10/03/2023 9:31 AM

## 2023-10-07 ENCOUNTER — Encounter: Payer: Self-pay | Admitting: Obstetrics and Gynecology

## 2023-10-07 ENCOUNTER — Telehealth: Payer: Self-pay | Admitting: Obstetrics and Gynecology

## 2023-10-07 NOTE — Telephone Encounter (Signed)
 Called patient to discuss pathology results. She reports she has continued to heal and feel better. Using medications as prescribed. Finisher her antibiotics. Boyfriend took his antibiotics. F/u scheduled with me next week. Provided number for [facility] and patient will call on Monday to schedule appointment to establish care.   Beverli LULLA Dinsmore, MD 10/07/2023 6:11 PM
